# Patient Record
Sex: Female | Born: 1960 | Race: White | Hispanic: No | Marital: Married | Smoking: Never smoker
Health system: Southern US, Community
[De-identification: ages and names within clinical notes are randomized; demographics above are authoritative.]

## PROBLEM LIST (undated history)

## (undated) DIAGNOSIS — Z9221 Personal history of antineoplastic chemotherapy: Secondary | ICD-10-CM

## (undated) DIAGNOSIS — I10 Essential (primary) hypertension: Secondary | ICD-10-CM

## (undated) DIAGNOSIS — C50919 Malignant neoplasm of unspecified site of unspecified female breast: Secondary | ICD-10-CM

## (undated) DIAGNOSIS — G629 Polyneuropathy, unspecified: Secondary | ICD-10-CM

## (undated) DIAGNOSIS — M1990 Unspecified osteoarthritis, unspecified site: Secondary | ICD-10-CM

## (undated) DIAGNOSIS — Z923 Personal history of irradiation: Secondary | ICD-10-CM

## (undated) DIAGNOSIS — J302 Other seasonal allergic rhinitis: Secondary | ICD-10-CM

## (undated) DIAGNOSIS — IMO0001 Reserved for inherently not codable concepts without codable children: Secondary | ICD-10-CM

## (undated) DIAGNOSIS — J329 Chronic sinusitis, unspecified: Secondary | ICD-10-CM

## (undated) DIAGNOSIS — IMO0002 Reserved for concepts with insufficient information to code with codable children: Secondary | ICD-10-CM

## (undated) HISTORY — DX: Malignant neoplasm of unspecified site of unspecified female breast: C50.919

---

## 2005-03-08 ENCOUNTER — Ambulatory Visit: Payer: Self-pay

## 2006-04-16 ENCOUNTER — Ambulatory Visit: Payer: Self-pay

## 2006-08-31 ENCOUNTER — Ambulatory Visit: Payer: Self-pay | Admitting: Physician Assistant

## 2006-09-24 ENCOUNTER — Ambulatory Visit: Payer: Self-pay | Admitting: Pain Medicine

## 2006-10-08 ENCOUNTER — Ambulatory Visit: Payer: Self-pay | Admitting: Pain Medicine

## 2006-10-22 ENCOUNTER — Ambulatory Visit: Payer: Self-pay | Admitting: Pain Medicine

## 2007-04-21 ENCOUNTER — Ambulatory Visit: Payer: Self-pay

## 2008-04-21 ENCOUNTER — Ambulatory Visit: Payer: Self-pay

## 2008-07-16 ENCOUNTER — Emergency Department: Payer: Self-pay | Admitting: Emergency Medicine

## 2008-07-16 ENCOUNTER — Other Ambulatory Visit: Payer: Self-pay

## 2009-05-12 ENCOUNTER — Ambulatory Visit: Payer: Self-pay

## 2010-10-11 ENCOUNTER — Ambulatory Visit: Payer: Self-pay

## 2011-11-06 ENCOUNTER — Ambulatory Visit: Payer: Self-pay

## 2014-12-03 DIAGNOSIS — Z9221 Personal history of antineoplastic chemotherapy: Secondary | ICD-10-CM | POA: Insufficient documentation

## 2014-12-03 DIAGNOSIS — Z923 Personal history of irradiation: Secondary | ICD-10-CM

## 2014-12-03 DIAGNOSIS — C50919 Malignant neoplasm of unspecified site of unspecified female breast: Secondary | ICD-10-CM

## 2014-12-03 HISTORY — DX: Personal history of antineoplastic chemotherapy: Z92.21

## 2014-12-03 HISTORY — DX: Malignant neoplasm of unspecified site of unspecified female breast: C50.919

## 2014-12-03 HISTORY — DX: Personal history of irradiation: Z92.3

## 2015-04-19 ENCOUNTER — Other Ambulatory Visit: Payer: Self-pay | Admitting: Obstetrics and Gynecology

## 2015-04-19 DIAGNOSIS — N63 Unspecified lump in unspecified breast: Secondary | ICD-10-CM

## 2015-04-22 ENCOUNTER — Ambulatory Visit: Payer: No Typology Code available for payment source

## 2015-04-22 ENCOUNTER — Ambulatory Visit
Admission: RE | Admit: 2015-04-22 | Discharge: 2015-04-22 | Disposition: A | Discharge status: Home / Self Care | Payer: No Typology Code available for payment source | Source: Ambulatory Visit | Attending: Obstetrics and Gynecology | Admitting: Obstetrics and Gynecology

## 2015-04-22 DIAGNOSIS — N63 Unspecified lump in unspecified breast: Secondary | ICD-10-CM

## 2015-04-26 ENCOUNTER — Other Ambulatory Visit: Payer: Self-pay | Admitting: Obstetrics and Gynecology

## 2015-04-26 DIAGNOSIS — R928 Other abnormal and inconclusive findings on diagnostic imaging of breast: Secondary | ICD-10-CM

## 2015-04-26 DIAGNOSIS — N63 Unspecified lump in unspecified breast: Secondary | ICD-10-CM

## 2015-04-27 ENCOUNTER — Ambulatory Visit
Admission: RE | Admit: 2015-04-27 | Discharge: 2015-04-27 | Disposition: A | Discharge status: Home / Self Care | Payer: No Typology Code available for payment source | Source: Ambulatory Visit | Attending: Obstetrics and Gynecology | Admitting: Obstetrics and Gynecology

## 2015-04-27 ENCOUNTER — Other Ambulatory Visit: Payer: Self-pay | Admitting: Obstetrics and Gynecology

## 2015-04-27 DIAGNOSIS — N63 Unspecified lump in unspecified breast: Secondary | ICD-10-CM

## 2015-04-27 DIAGNOSIS — R928 Other abnormal and inconclusive findings on diagnostic imaging of breast: Secondary | ICD-10-CM

## 2015-04-27 DIAGNOSIS — C50012 Malignant neoplasm of nipple and areola, left female breast: Secondary | ICD-10-CM | POA: Insufficient documentation

## 2015-04-27 HISTORY — PX: BREAST BIOPSY: SHX20

## 2015-04-29 LAB — SURGICAL PATHOLOGY

## 2015-05-03 ENCOUNTER — Other Ambulatory Visit: Payer: Self-pay | Admitting: Oncology

## 2015-05-04 ENCOUNTER — Ambulatory Visit: Payer: PRIVATE HEALTH INSURANCE | Admitting: Surgery

## 2015-05-05 ENCOUNTER — Ambulatory Visit (INDEPENDENT_AMBULATORY_CARE_PROVIDER_SITE_OTHER): Payer: PRIVATE HEALTH INSURANCE | Admitting: Surgery

## 2015-05-05 ENCOUNTER — Encounter: Payer: Self-pay | Admitting: Surgery

## 2015-05-05 ENCOUNTER — Ambulatory Visit: Payer: PRIVATE HEALTH INSURANCE | Admitting: Surgery

## 2015-05-05 VITALS — BP 146/86 | HR 83 | Temp 98.3°F | Resp 20

## 2015-05-05 DIAGNOSIS — C50912 Malignant neoplasm of unspecified site of left female breast: Secondary | ICD-10-CM | POA: Diagnosis not present

## 2015-05-05 NOTE — Progress Notes (Signed)
Surgical Consultation  05/05/2015  Brittany Ryan is an 54 y.o. female.   CC: Left breast mass  HPI: This a patient who does regular self exams back in April noted a left breast mass which was persistent and did not disappear. In fact it has been increasing in size. She has no pain no discharge.  This patient had a ultrasound-guided core biopsy performed on the left last week. 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  GYN History: G1 P1 AB 0  Family Breast Cancer History: Paternal aunt, possibly a distant maternal relative.  History reviewed. No pertinent past medical history.  Past Surgical History  Procedure Laterality Date  . Breast biopsy Left 04/27/2015    results unknown    Family History  Problem Relation Age of Onset  . Cancer Father     Social History:  reports that she has never smoked. She has never used smokeless tobacco. She reports that she does not drink alcohol or use illicit drugs.  Allergies: No Known Allergies  Medications reviewed.   Review of Systems:   Review of Systems  Constitutional: Negative.   HENT: Negative.   Eyes: Negative.   Respiratory: Negative.   Cardiovascular: Negative.   Gastrointestinal: Negative.   Genitourinary: Negative.   Musculoskeletal: Negative.   Skin: Negative.   Neurological: Negative.   Endo/Heme/Allergies: Negative.   Psychiatric/Behavioral: Negative.      Physical Exam:  BP 146/86 mmHg  Pulse 83  Temp(Src) 98.3 F (36.8 C) (Oral)  Resp 20  Physical Exam  Constitutional: She is oriented to person, place, and time and well-developed, well-nourished, and in no distress. No distress.  HENT:  Head: Normocephalic and atraumatic.  Eyes: Conjunctivae are normal. Pupils are equal, round, and reactive to light. No scleral icterus.  Neck: Normal range of motion.  Pulmonary/Chest: Effort normal and breath sounds normal. No respiratory distress. She has no wheezes.    Abdominal: Soft. Bowel sounds are  normal. She exhibits no distension. There is no tenderness.  Musculoskeletal: Normal range of motion.  Lymphadenopathy:    She has no cervical adenopathy.  Neurological: She is alert and oriented to person, place, and time.  Skin: Skin is warm and dry.  Psychiatric: Mood, affect and judgment normal.    Breast Exam: Right breast shows no masses no axillary adenopathy.  Left breast exam shows no axillary adenopathy no discharge. There is a palpable mass at the 11:00 position with a biopsy site somewhat lateral to that and mild ecchymosis.    No results found for this or any previous visit (from the past 48 hour(s)). No results found.  Pathology: Pathology is personally reviewed demonstrating adenocarcinoma breast origin.  Assessment/Plan:  This is a patient with a left breast adenocarcinoma may be as large as 1.6 cm but is likely smaller.  I discussed with she and her husband the rationale for offering surgery in the form of a total mastectomy with sentinel node biopsy, or breast conservation therapy, partial mastectomy with needle localization left axillary sentinel node biopsy and radiotherapy. I discussed the pathophysiology of breast cancer and I discussed med onc and radiation oncology consultation. Multiple questions were answered for them.  I will see them in the office after medical oncology consultation, and schedule surgery for them that same week. They understood and agreed to proceed with this plan.  Florene Glen, MD, FACS

## 2015-05-05 NOTE — Patient Instructions (Addendum)
See medical oncology and radiation oncology next week.  See Dr. Burt Knack in the office on June 13.  We will schedule surgery for later in the week of June 13.

## 2015-05-06 ENCOUNTER — Telehealth: Payer: Self-pay | Admitting: Surgery

## 2015-05-06 NOTE — Telephone Encounter (Signed)
Sheena from the [facility] called regarding a referral needed for medical oncology. She will need an appt before she sees Dr. Burt Knack on the 13th. Vita Barley was hoping to get her in on Tuesday. [phone]

## 2015-05-09 NOTE — Telephone Encounter (Signed)
Dr Burt Knack will have to add referral within epic so that it does to the cancer center's referral work queue.

## 2015-05-10 ENCOUNTER — Other Ambulatory Visit: Payer: Self-pay | Admitting: Surgery

## 2015-05-10 ENCOUNTER — Inpatient Hospital Stay: Payer: No Typology Code available for payment source | Attending: Oncology | Admitting: Oncology

## 2015-05-10 ENCOUNTER — Encounter: Payer: Self-pay | Admitting: Oncology

## 2015-05-10 ENCOUNTER — Ambulatory Visit: Payer: PRIVATE HEALTH INSURANCE | Admitting: Surgery

## 2015-05-10 VITALS — BP 131/78 | HR 66 | Temp 97.4°F | Resp 20 | Ht 69.0 in | Wt 215.6 lb

## 2015-05-10 DIAGNOSIS — C50212 Malignant neoplasm of upper-inner quadrant of left female breast: Secondary | ICD-10-CM | POA: Diagnosis not present

## 2015-05-10 DIAGNOSIS — Z9012 Acquired absence of left breast and nipple: Secondary | ICD-10-CM | POA: Diagnosis not present

## 2015-05-10 DIAGNOSIS — C50912 Malignant neoplasm of unspecified site of left female breast: Secondary | ICD-10-CM

## 2015-05-10 DIAGNOSIS — Z171 Estrogen receptor negative status [ER-]: Secondary | ICD-10-CM | POA: Insufficient documentation

## 2015-05-10 DIAGNOSIS — F419 Anxiety disorder, unspecified: Secondary | ICD-10-CM | POA: Insufficient documentation

## 2015-05-10 DIAGNOSIS — I10 Essential (primary) hypertension: Secondary | ICD-10-CM | POA: Insufficient documentation

## 2015-05-10 NOTE — Progress Notes (Signed)
Met patient at initial Medical Oncology visit.  Given Breast Cancer Treatment Handbook/folder with hospital services.  Dr. Grayland Ormond reviewed treatment paln with patient.  Supported patient when she became emotional during chemotherapy discussion related to triple negative diagnosis.  She is scheduled for lumpectomy, and port placement on 05/19/15 with Dr. Burt Knack at [place], and will follow-up with Dr. Grayland Ormond the last week in June.  At that appointment , her MUGA scan, and chemotherapy class will be scheduled.  Accompanied to resource center and introduced to [facility] volunteer to discuss wigs, and offered to assist with wig up to $150.00 through [employer] if unable to find one in [employer].  BRCA testing sent.

## 2015-05-11 ENCOUNTER — Other Ambulatory Visit: Payer: Self-pay | Admitting: Surgery

## 2015-05-11 DIAGNOSIS — C50912 Malignant neoplasm of unspecified site of left female breast: Secondary | ICD-10-CM

## 2015-05-12 ENCOUNTER — Encounter
Admission: RE | Admit: 2015-05-12 | Discharge: 2015-05-12 | Disposition: A | Discharge status: Home / Self Care | Payer: No Typology Code available for payment source | Source: Ambulatory Visit | Attending: Surgery | Admitting: Surgery

## 2015-05-12 DIAGNOSIS — I10 Essential (primary) hypertension: Secondary | ICD-10-CM | POA: Insufficient documentation

## 2015-05-12 DIAGNOSIS — J328 Other chronic sinusitis: Secondary | ICD-10-CM | POA: Diagnosis not present

## 2015-05-12 DIAGNOSIS — C50912 Malignant neoplasm of unspecified site of left female breast: Secondary | ICD-10-CM | POA: Insufficient documentation

## 2015-05-12 DIAGNOSIS — Z9109 Other allergy status, other than to drugs and biological substances: Secondary | ICD-10-CM | POA: Insufficient documentation

## 2015-05-12 DIAGNOSIS — Z8249 Family history of ischemic heart disease and other diseases of the circulatory system: Secondary | ICD-10-CM | POA: Diagnosis not present

## 2015-05-12 DIAGNOSIS — Z01812 Encounter for preprocedural laboratory examination: Secondary | ICD-10-CM | POA: Diagnosis present

## 2015-05-12 DIAGNOSIS — Z809 Family history of malignant neoplasm, unspecified: Secondary | ICD-10-CM | POA: Insufficient documentation

## 2015-05-12 DIAGNOSIS — Z01810 Encounter for preprocedural cardiovascular examination: Secondary | ICD-10-CM | POA: Insufficient documentation

## 2015-05-12 HISTORY — DX: Essential (primary) hypertension: I10

## 2015-05-12 HISTORY — DX: Chronic sinusitis, unspecified: J32.9

## 2015-05-12 HISTORY — DX: Other seasonal allergic rhinitis: J30.2

## 2015-05-12 LAB — CBC
HCT: 38.7 % (ref 35.0–47.0)
Hemoglobin: 12.7 g/dL (ref 12.0–16.0)
MCH: 31.2 pg (ref 26.0–34.0)
MCHC: 32.9 g/dL (ref 32.0–36.0)
MCV: 94.7 fL (ref 80.0–100.0)
PLATELETS: 258 10*3/uL (ref 150–440)
RBC: 4.08 MIL/uL (ref 3.80–5.20)
RDW: 13.4 % (ref 11.5–14.5)
WBC: 6.6 10*3/uL (ref 3.6–11.0)

## 2015-05-12 LAB — BASIC METABOLIC PANEL
ANION GAP: 7 (ref 5–15)
BUN: 13 mg/dL (ref 6–20)
CALCIUM: 9.1 mg/dL (ref 8.9–10.3)
CO2: 29 mmol/L (ref 22–32)
Chloride: 105 mmol/L (ref 101–111)
Creatinine, Ser: 0.84 mg/dL (ref 0.44–1.00)
Glucose, Bld: 85 mg/dL (ref 65–99)
POTASSIUM: 4.1 mmol/L (ref 3.5–5.1)
Sodium: 141 mmol/L (ref 135–145)

## 2015-05-12 LAB — COMPREHENSIVE METABOLIC PANEL
ALBUMIN: 3.9 g/dL (ref 3.5–5.0)
ALT: 15 U/L (ref 14–54)
AST: 20 U/L (ref 15–41)
Alkaline Phosphatase: 82 U/L (ref 38–126)
Anion gap: 7 (ref 5–15)
BUN: 13 mg/dL (ref 6–20)
CALCIUM: 9.1 mg/dL (ref 8.9–10.3)
CO2: 29 mmol/L (ref 22–32)
CREATININE: 0.82 mg/dL (ref 0.44–1.00)
Chloride: 104 mmol/L (ref 101–111)
GFR calc Af Amer: >60 mL/min (ref >60)
Glucose, Bld: 83 mg/dL (ref 65–99)
Potassium: 4.1 mmol/L (ref 3.5–5.1)
Sodium: 140 mmol/L (ref 135–145)
Total Bilirubin: 0.5 mg/dL (ref 0.3–1.2)
Total Protein: 6.8 g/dL (ref 6.5–8.1)

## 2015-05-12 NOTE — Patient Instructions (Signed)
  Your procedure is scheduled on: 05/19/15 Thurs Report to Mammography.  Remember: Instructions that are not followed completely may result in serious medical risk, up to and including death, or upon the discretion of your surgeon and anesthesiologist your surgery may need to be rescheduled.    _x___ 1. Do not eat food or drink liquids after midnight. No gum chewing or hard candies.     ____ 2. No Alcohol for 24 hours before or after surgery.   ____ 3. Bring all medications with you on the day of surgery if instructed.    _x___ 4. Notify your doctor if there is any change in your medical condition     (cold, fever, infections).     Do not wear jewelry, make-up, hairpins, clips or nail polish.  Do not wear lotions, powders, or perfumes. You may wear deodorant.  Do not shave 48 hours prior to surgery. Men may shave face and neck.  Do not bring valuables to the hospital.    [facility] is not responsible for any belongings or valuables.               Contacts, dentures or bridgework may not be worn into surgery.  Leave your suitcase in the car. After surgery it may be brought to your room.  For patients admitted to the hospital, discharge time is determined by your                treatment team.   Patients discharged the day of surgery will not be allowed to drive home.    ____ Take these medicines the morning of surgery with A SIP OF WATER:    1.  2.   3.   4.  5.  6.  ____ Fleet Enema (as directed)   __x__ Use CHG Soap as directed  ____ Use inhalers on the day of surgery  ____ Stop metformin 2 days prior to surgery    ____ Take 1/2 of usual insulin dose the night before surgery and none on the morning of surgery.   ____ Stop Coumadin/Plavix/aspirin on   ____ Stop Anti-inflammatories on    ____ Stop supplements until after surgery.    ____ Bring C-Pap to the hospital.

## 2015-05-16 ENCOUNTER — Encounter
Admission: RE | Admit: 2015-05-16 | Discharge: 2015-05-16 | Disposition: A | Discharge status: Home / Self Care | Payer: No Typology Code available for payment source | Source: Ambulatory Visit | Attending: Surgery | Admitting: Surgery

## 2015-05-16 ENCOUNTER — Ambulatory Visit
Admission: RE | Admit: 2015-05-16 | Discharge: 2015-05-16 | Disposition: A | Discharge status: Home / Self Care | Payer: No Typology Code available for payment source | Source: Ambulatory Visit | Attending: Surgery | Admitting: Surgery

## 2015-05-16 ENCOUNTER — Ambulatory Visit (INDEPENDENT_AMBULATORY_CARE_PROVIDER_SITE_OTHER): Payer: PRIVATE HEALTH INSURANCE | Admitting: Surgery

## 2015-05-16 ENCOUNTER — Encounter: Payer: Self-pay | Admitting: Surgery

## 2015-05-16 VITALS — BP 187/93 | HR 94 | Temp 98.3°F | Ht 69.0 in | Wt 219.0 lb

## 2015-05-16 DIAGNOSIS — Z01818 Encounter for other preprocedural examination: Secondary | ICD-10-CM | POA: Insufficient documentation

## 2015-05-16 DIAGNOSIS — C50919 Malignant neoplasm of unspecified site of unspecified female breast: Secondary | ICD-10-CM | POA: Insufficient documentation

## 2015-05-16 DIAGNOSIS — C50212 Malignant neoplasm of upper-inner quadrant of left female breast: Secondary | ICD-10-CM

## 2015-05-16 NOTE — Progress Notes (Signed)
Surgical Consultation  05/16/2015  Brittany Ryan is an 54 y.o. female.   KG:URKY breast cancer  HPI: This patient with biopsy-proven left breast cancer. She is here for follow-up and discussion concerning surgical options. She has seen Dr. Grayland Ormond who has recommended a port placement as well. She has chosen breast conservation therapy.  GYN History:   Family Breast Cancer History:    Past Medical History  Diagnosis Date  . Breast cancer   . Seasonal allergies   . Frequent sinus infections   . Hypertension     Past Surgical History  Procedure Laterality Date  . Breast biopsy Left 04/27/2015    results unknown    Family History  Problem Relation Age of Onset  . Cancer Father   . Hypertension Father   . CAD Father   . Heart attack Father     Social History:  reports that she has never smoked. She has never used smokeless tobacco. She reports that she drinks alcohol. She reports that she does not use illicit drugs.  Allergies: No Known Allergies  Medications reviewed.   Review of Systems:   Review of Systems  Constitutional: Negative.   HENT: Negative.   Eyes: Negative.   Respiratory: Negative.   Cardiovascular: Negative.   Gastrointestinal: Negative.   Musculoskeletal: Negative.   Skin: Negative.      Physical Exam:  BP 187/93 mmHg  Pulse 94  Temp(Src) 98.3 F (36.8 C) (Oral)  Ht 5\' 9"  (1.753 m)  Wt 99.338 kg (219 lb)  BMI 32.33 kg/m2  Physical Exam  Constitutional: She is well-developed, well-nourished, and in no distress.  HENT:  Head: Normocephalic and atraumatic.  Pulmonary/Chest:      Breast Exam: Small palpable breast lump in the 11:00 position ecchymosis has resolved lump seems smaller due to resolution of ecchymosis.    No results found for this or any previous visit (from the past 48 hour(s)). X-ray Chest Pa Or Ap  05/16/2015   CLINICAL DATA:  Preoperative evaluation for breast carcinoma  EXAM: CHEST  1 VIEW  COMPARISON:   None.  FINDINGS: There is minimal scarring in the left base. There is no edema or consolidation. Heart size and pulmonary vascularity are normal. No adenopathy. No bone lesions.  IMPRESSION: No edema or consolidation.  No demonstrable mass or adenopathy.   Electronically Signed   By: Lowella Grip III M.D.   On: 05/16/2015 14:42    Pathology: Reviewed  Assessment/Plan:  Left breast cancer. Patient has chosen breast conservation therapy. She has seen medical oncology who has suggested port placement as well.  I discussed with the patient the rationale for offering mastectomy versus partial mastectomy sentinel node biopsy and radiotherapy. We discussed breast conservation therapy as her choice of primary treatment. She is told me that Dr. Grayland Ormond has suggested port placement for likely chemotherapy.  I described for her the procedure of left needle localized partial mastectomy and left sentinel node biopsy and the risks of bleeding infection recurrence cosmetic deformity seroma hematoma open wound is medically deformity and the absolute need for radiotherapy and the potential for an axillary node biopsy versus dissection.  I discussed port placement and the options of observation versus port placement versus peripheral IV. She is confused about this but is likely going to agree with port placement. I described the procedure and I described the potential risks including bleeding infection thrombosis nonfunction breakage the need for replacement or revision and the risk of pneumothorax or hemopneumothorax requiring chest  tube placement or emergency thoracotomy. She was very tearful at this and wished to discuss this again with Dr. Grayland Ormond. I suggested I would place this on the schedule and she could decide on the day of surgery whether or not she wanted to proceed with port placement as well. Questions were answered for her she understood and agreed with this plan.  Florene Glen, MD,  FACS

## 2015-05-18 NOTE — Telephone Encounter (Signed)
Pt advised of pre op date/time and sx date. Sx: 05/19/15 with Dr Burt Knack for partial mastectomy, Left SN bx with NL. Pre op: 05/12/15 office @ 7:30am.

## 2015-05-19 ENCOUNTER — Encounter
Admission: RE | Admit: 2015-05-19 | Discharge: 2015-05-19 | Disposition: A | Discharge status: Home / Self Care | Payer: No Typology Code available for payment source | Source: Ambulatory Visit | Attending: Surgery | Admitting: Surgery

## 2015-05-19 ENCOUNTER — Encounter: Payer: Self-pay | Admitting: *Deleted

## 2015-05-19 ENCOUNTER — Ambulatory Visit: Payer: No Typology Code available for payment source

## 2015-05-19 ENCOUNTER — Encounter
Admission: RE | Disposition: A | Discharge status: Home / Self Care | Payer: Self-pay | Source: Ambulatory Visit | Attending: Surgery

## 2015-05-19 ENCOUNTER — Ambulatory Visit: Payer: No Typology Code available for payment source | Admitting: Anesthesiology

## 2015-05-19 ENCOUNTER — Ambulatory Visit
Admission: RE | Admit: 2015-05-19 | Discharge: 2015-05-19 | Disposition: A | Discharge status: Home / Self Care | Payer: No Typology Code available for payment source | Source: Ambulatory Visit | Attending: Surgery | Admitting: Surgery

## 2015-05-19 ENCOUNTER — Other Ambulatory Visit: Payer: Self-pay | Admitting: Surgery

## 2015-05-19 DIAGNOSIS — Z803 Family history of malignant neoplasm of breast: Secondary | ICD-10-CM | POA: Diagnosis not present

## 2015-05-19 DIAGNOSIS — I10 Essential (primary) hypertension: Secondary | ICD-10-CM | POA: Diagnosis not present

## 2015-05-19 DIAGNOSIS — Z8249 Family history of ischemic heart disease and other diseases of the circulatory system: Secondary | ICD-10-CM | POA: Diagnosis not present

## 2015-05-19 DIAGNOSIS — C50412 Malignant neoplasm of upper-outer quadrant of left female breast: Secondary | ICD-10-CM | POA: Insufficient documentation

## 2015-05-19 DIAGNOSIS — C50912 Malignant neoplasm of unspecified site of left female breast: Secondary | ICD-10-CM | POA: Insufficient documentation

## 2015-05-19 DIAGNOSIS — Z171 Estrogen receptor negative status [ER-]: Secondary | ICD-10-CM | POA: Diagnosis not present

## 2015-05-19 DIAGNOSIS — Z419 Encounter for procedure for purposes other than remedying health state, unspecified: Secondary | ICD-10-CM

## 2015-05-19 DIAGNOSIS — Z95828 Presence of other vascular implants and grafts: Secondary | ICD-10-CM

## 2015-05-19 HISTORY — PX: MASTECTOMY, PARTIAL: SHX709

## 2015-05-19 HISTORY — PX: BREAST LUMPECTOMY WITH SENTINEL LYMPH NODE BIOPSY: SHX5597

## 2015-05-19 HISTORY — PX: BREAST LUMPECTOMY WITH NEEDLE LOCALIZATION: SHX5759

## 2015-05-19 HISTORY — PX: PORTACATH PLACEMENT: SHX2246

## 2015-05-19 SURGERY — MASTECTOMY PARTIAL
Anesthesia: General | Laterality: Left | Wound class: Clean

## 2015-05-19 MED ORDER — FENTANYL CITRATE (PF) 100 MCG/2ML IJ SOLN
INTRAMUSCULAR | Status: DC | PRN
  Administered 2015-05-19: 100 ug via INTRAVENOUS

## 2015-05-19 MED ORDER — TECHNETIUM TC 99M SULFUR COLLOID: 1.0700 | Freq: Once | INTRAVENOUS | Status: AC | PRN

## 2015-05-19 MED ORDER — HEPARIN SODIUM (PORCINE) 1000 UNIT/ML IJ SOLN
INTRAMUSCULAR | Status: DC | PRN
  Administered 2015-05-19: 51 mL via INTRAMUSCULAR

## 2015-05-19 MED ORDER — FAMOTIDINE 20 MG PO TABS
ORAL_TABLET | ORAL | Status: AC
  Administered 2015-05-19: 20 mg via ORAL
  Filled 2015-05-19: qty 1

## 2015-05-19 MED ORDER — CEFAZOLIN SODIUM-DEXTROSE 2-3 GM-% IV SOLR
2.0000 g | Freq: Once | INTRAVENOUS | Status: AC
  Administered 2015-05-19: 2 g via INTRAVENOUS

## 2015-05-19 MED ORDER — BUPIVACAINE-EPINEPHRINE (PF) 0.25% -1:200000 IJ SOLN
INTRAMUSCULAR | Status: AC
  Filled 2015-05-19: qty 30

## 2015-05-19 MED ORDER — HEPARIN SODIUM (PORCINE) 5000 UNIT/ML IJ SOLN
5000.0000 [IU] | Freq: Three times a day (TID) | INTRAMUSCULAR | Status: DC
  Administered 2015-05-19: 5000 [IU] via SUBCUTANEOUS

## 2015-05-19 MED ORDER — LACTATED RINGERS IV SOLN
INTRAVENOUS | Status: DC
  Administered 2015-05-19 (×2): via INTRAVENOUS

## 2015-05-19 MED ORDER — ISOSULFAN BLUE 1 % ~~LOC~~ SOLN
SUBCUTANEOUS | Status: AC
  Filled 2015-05-19: qty 5

## 2015-05-19 MED ORDER — LIDOCAINE HCL (CARDIAC) 20 MG/ML IV SOLN
INTRAVENOUS | Status: DC | PRN
  Administered 2015-05-19: 100 mg via INTRAVENOUS

## 2015-05-19 MED ORDER — HYDROMORPHONE HCL 1 MG/ML IJ SOLN: 0.2500 mg | INTRAMUSCULAR | Status: DC | PRN

## 2015-05-19 MED ORDER — HYDROCODONE-ACETAMINOPHEN 5-300 MG PO TABS: 1.0000 | ORAL_TABLET | ORAL | Status: DC | PRN

## 2015-05-19 MED ORDER — HYDROCODONE-ACETAMINOPHEN 5-325 MG PO TABS
ORAL_TABLET | ORAL | Status: AC
  Administered 2015-05-19: 1 via ORAL
  Filled 2015-05-19: qty 1

## 2015-05-19 MED ORDER — FAMOTIDINE 20 MG PO TABS
20.0000 mg | ORAL_TABLET | Freq: Once | ORAL | Status: AC
  Administered 2015-05-19: 20 mg via ORAL

## 2015-05-19 MED ORDER — SODIUM CHLORIDE 0.9 % IJ SOLN
INTRAMUSCULAR | Status: AC
  Filled 2015-05-19: qty 50

## 2015-05-19 MED ORDER — PHENYLEPHRINE HCL 10 MG/ML IJ SOLN
INTRAMUSCULAR | Status: DC | PRN
  Administered 2015-05-19 (×2): 100 ug via INTRAVENOUS
  Administered 2015-05-19: 50 ug via INTRAVENOUS

## 2015-05-19 MED ORDER — FENTANYL CITRATE (PF) 100 MCG/2ML IJ SOLN
25.0000 ug | INTRAMUSCULAR | Status: DC | PRN
  Administered 2015-05-19 (×4): 25 ug via INTRAVENOUS

## 2015-05-19 MED ORDER — MIDAZOLAM HCL 2 MG/2ML IJ SOLN
INTRAMUSCULAR | Status: DC | PRN
  Administered 2015-05-19: 3 mg via INTRAVENOUS
  Administered 2015-05-19: 2 mg via INTRAVENOUS

## 2015-05-19 MED ORDER — HEPARIN SODIUM (PORCINE) 5000 UNIT/ML IJ SOLN
INTRAMUSCULAR | Status: AC
  Filled 2015-05-19: qty 1

## 2015-05-19 MED ORDER — BUPIVACAINE-EPINEPHRINE (PF) 0.25% -1:200000 IJ SOLN
INTRAMUSCULAR | Status: DC | PRN
  Administered 2015-05-19: 30 mL

## 2015-05-19 MED ORDER — BUPIVACAINE HCL (PF) 0.25 % IJ SOLN
INTRAMUSCULAR | Status: DC | PRN
  Administered 2015-05-19: 10 mL

## 2015-05-19 MED ORDER — ONDANSETRON HCL 4 MG/2ML IJ SOLN
INTRAMUSCULAR | Status: AC
  Filled 2015-05-19: qty 2

## 2015-05-19 MED ORDER — PROPOFOL 10 MG/ML IV BOLUS
INTRAVENOUS | Status: DC | PRN
  Administered 2015-05-19: 150 mg via INTRAVENOUS

## 2015-05-19 MED ORDER — CEFAZOLIN SODIUM-DEXTROSE 2-3 GM-% IV SOLR
INTRAVENOUS | Status: AC
  Administered 2015-05-19: 2 g via INTRAVENOUS
  Filled 2015-05-19: qty 50

## 2015-05-19 MED ORDER — BUPIVACAINE HCL (PF) 0.25 % IJ SOLN
INTRAMUSCULAR | Status: AC
  Filled 2015-05-19: qty 30

## 2015-05-19 MED ORDER — ONDANSETRON HCL 4 MG/2ML IJ SOLN
4.0000 mg | Freq: Once | INTRAMUSCULAR | Status: AC | PRN
  Administered 2015-05-19: 4 mg via INTRAVENOUS

## 2015-05-19 MED ORDER — FENTANYL CITRATE (PF) 100 MCG/2ML IJ SOLN
INTRAMUSCULAR | Status: AC
  Filled 2015-05-19: qty 2

## 2015-05-19 MED ORDER — HEPARIN SODIUM (PORCINE) 5000 UNIT/ML IJ SOLN
INTRAMUSCULAR | Status: AC
  Administered 2015-05-19: 5000 [IU] via SUBCUTANEOUS
  Filled 2015-05-19: qty 1

## 2015-05-19 SURGICAL SUPPLY — 49 items
ADHESIVE MASTISOL STRL (MISCELLANEOUS) ×2 IMPLANT
APPLIER CLIP 9.375 SM OPEN (CLIP) ×2
BLADE SURG 15 STRL LF DISP TIS (BLADE) ×1 IMPLANT
BLADE SURG 15 STRL SS (BLADE) ×1
CANISTER SUCT 1200ML W/VALVE (MISCELLANEOUS) ×2 IMPLANT
CHLORAPREP W/TINT 26ML (MISCELLANEOUS) IMPLANT
CLIP APPLIE 9.375 SM OPEN (CLIP) ×1 IMPLANT
CNTNR SPEC 2.5X3XGRAD LEK (MISCELLANEOUS) ×4
CONT SPEC 4OZ STER OR WHT (MISCELLANEOUS) ×4
CONTAINER SPEC 2.5X3XGRAD LEK (MISCELLANEOUS) ×4 IMPLANT
COVER LIGHT HANDLE STERIS (MISCELLANEOUS) ×4 IMPLANT
DECANTER SPIKE VIAL GLASS SM (MISCELLANEOUS) ×2 IMPLANT
DEVICE DUBIN SPECIMEN MAMMOGRA (MISCELLANEOUS) ×2 IMPLANT
DRAPE C-ARM XRAY 36X54 (DRAPES) ×2 IMPLANT
DRAPE LAPAROTOMY TRNSV 106X77 (MISCELLANEOUS) ×2 IMPLANT
DRAPE PED LAPAROTOMY (DRAPES) ×2 IMPLANT
GAUZE FLUFF 18X24 1PLY STRL (GAUZE/BANDAGES/DRESSINGS) ×2 IMPLANT
GAUZE SPONGE 4X4 12PLY STRL (GAUZE/BANDAGES/DRESSINGS) ×2 IMPLANT
GLOVE BIO SURGEON STRL SZ8 (GLOVE) ×4 IMPLANT
GOWN STRL REUS W/ TWL LRG LVL3 (GOWN DISPOSABLE) ×2 IMPLANT
GOWN STRL REUS W/TWL LRG LVL3 (GOWN DISPOSABLE) ×2
KIT RM TURNOVER STRD PROC AR (KITS) ×2 IMPLANT
LABEL OR SOLS (LABEL) ×2 IMPLANT
NDL SAFETY 18GX1.5 (NEEDLE) ×2 IMPLANT
NDL SAFETY 22GX1.5 (NEEDLE) ×2 IMPLANT
NEEDLE FILTER BLUNT 18X 1/2SAF (NEEDLE) ×1
NEEDLE FILTER BLUNT 18X1 1/2 (NEEDLE) ×1 IMPLANT
NEEDLE HYPO 27GX1-1/4 (NEEDLE) ×2 IMPLANT
PACK BASIN MINOR ARMC (MISCELLANEOUS) ×2 IMPLANT
PACK PORT-A-CATH (MISCELLANEOUS) IMPLANT
PAD GROUND ADULT SPLIT (MISCELLANEOUS) ×2 IMPLANT
PORTACATH POWER 8F (Port) ×2 IMPLANT
SLEVE PROBE SENORX GAMMA FIND (MISCELLANEOUS) ×2 IMPLANT
SPONGE LAP 18X18 5 PK (GAUZE/BANDAGES/DRESSINGS) ×4 IMPLANT
STRIP CLOSURE SKIN 1/2X4 (GAUZE/BANDAGES/DRESSINGS) ×2 IMPLANT
SUT ETH BLK MONO 3 0 FS 1 12/B (SUTURE) ×2 IMPLANT
SUT MNCRL 4-0 (SUTURE) ×2
SUT MNCRL 4-0 27XMFL (SUTURE) ×2
SUT MNCRL AB 4-0 PS2 18 (SUTURE) ×2 IMPLANT
SUT PROLENE 3 0 SH DA (SUTURE) ×2 IMPLANT
SUT VIC AB 3-0 SH 27 (SUTURE) ×3
SUT VIC AB 3-0 SH 27X BRD (SUTURE) ×3 IMPLANT
SUTURE MNCRL 4-0 27XMF (SUTURE) ×2 IMPLANT
SYR 3ML LL SCALE MARK (SYRINGE) ×4 IMPLANT
SYR CONTROL 10ML (SYRINGE) ×2 IMPLANT
SYRINGE 10CC LL (SYRINGE) ×2 IMPLANT
TAPE MICROFOAM 4IN (TAPE) ×2 IMPLANT
TOWEL OR 17X26 4PK STRL BLUE (TOWEL DISPOSABLE) ×2 IMPLANT
WATER STERILE IRR 1000ML POUR (IV SOLUTION) ×2 IMPLANT

## 2015-05-19 NOTE — Anesthesia Postprocedure Evaluation (Signed)
  Anesthesia Post-op Note  Patient: Brittany Ryan  Procedure(s) Performed: Procedure(s): MASTECTOMY PARTIAL (Left) BREAST LUMPECTOMY WITH NEEDLE LOCALIZATION (Left) BREAST LUMPECTOMY WITH SENTINEL LYMPH NODE BX (Left) INSERTION PORT-A-CATH (Left)  Anesthesia type:General  Patient location: PACU  Post pain: Pain level controlled  Post assessment: Post-op Vital signs reviewed, Patient's Cardiovascular Status Stable, Respiratory Function Stable, Patent Airway and No signs of Nausea or vomiting  Post vital signs: Reviewed and stable  Last Vitals:  Filed Vitals:   05/19/15 1410  BP:   Pulse: 66  Temp:   Resp: 21    Level of consciousness: awake, alert  and patient cooperative  Complications: No apparent anesthesia complications

## 2015-05-19 NOTE — OR Nursing (Signed)
ice applied to operative area for comfort

## 2015-05-19 NOTE — Transfer of Care (Signed)
Immediate Anesthesia Transfer of Care Note  Patient: Brittany Ryan  Procedure(s) Performed: Procedure(s): MASTECTOMY PARTIAL (Left) BREAST LUMPECTOMY WITH NEEDLE LOCALIZATION (Left) BREAST LUMPECTOMY WITH SENTINEL LYMPH NODE BX (Left) INSERTION PORT-A-CATH (Left)  Patient Location: PACU  Anesthesia Type:General  Level of Consciousness: awake and sedated  Airway & Oxygen Therapy: Patient connected to face mask oxygen  Post-op Assessment: Report given to RN  Post vital signs: Reviewed and stable  Last Vitals:  Filed Vitals:   05/19/15 0922  BP: 150/92  Pulse: 67  Temp: 36.6 C  Resp: 16    Complications: No apparent anesthesia complications

## 2015-05-19 NOTE — Progress Notes (Signed)
Preoperative Review   Patient is met in the preoperative holding area. The history is reviewed in the chart and with the patient. I personally reviewed the options and rationale as well as the risks of this procedure that have been previously discussed with the patient. All questions asked by the patient and/or family were answered to their satisfaction.  The procedure of partial mastectomy with needle localization and sentinel node biopsy was discussed as was her choice for port placement with fluoroscopy at this time. This is all reviewed in detail as were the risks in detail as discussed in the office previously.  Patient agrees to proceed with this procedure at this time.  Florene Glen M.D. FACS

## 2015-05-19 NOTE — Discharge Instructions (Addendum)
Remove dressing in 24 hours. May shower in 24 hours. Leave paper strips in place. Resume all home medications. Follow-up with Dr. Burt Knack in 10 days. Sentinel Lymph Node Biopsy Care After Refer to this sheet in the next few weeks. These instructions provide you with information on caring for yourself after your procedure. Your caregiver may also give you more specific instructions. Your treatment has been planned according to current medical practices, but problems sometimes occur. Call your caregiver if you have any problems or questions after your procedure. HOME CARE INSTRUCTIONS  Avoid vigorous exercise. Ask your caregiver when you can return to your normal activities.  You may shower 24 hours after your procedure. It is okay to get your surgical cut (incision) wet. Gently pat the incision dry after you shower.  If you are given a surgical bra, wear it for the next 48 hours. You may remove the bra to shower.  Keep all follow-up appointments as directed by your caregiver.  If you have skin adhesive strips over the incision, do not remove them. They will fall off on their own over time.  Only take over-the-counter or prescription medicines for pain, fever, or discomfort as directed by your caregiver.  You may resume your regular diet. SEEK IMMEDIATE MEDICAL CARE IF:   Your pain is not controlled with medicine.  You notice redness, swelling, or increased fluid draining from the incision.  You feel nauseous or vomit. MAKE SURE YOU:  Understand these instructions.  Will watch your condition.  Will get help right away if you are not doing well or get worse. Document Released: 07/03/2004 Document Revised: 02/11/2012 Document Reviewed: 01/08/2014 [facility] Patient Information 2015 [place], [place]. This information is not intended to replace advice given to you by your health care provider. Make sure you discuss any questions you have with your health care provider.

## 2015-05-19 NOTE — Anesthesia Procedure Notes (Signed)
Procedure Name: LMA Insertion Date/Time: 05/19/2015 11:15 AM Performed by: Nelda Marseille Pre-anesthesia Checklist: Patient identified, Emergency Drugs available, Suction available, Patient being monitored and Timeout performed Patient Re-evaluated:Patient Re-evaluated prior to inductionOxygen Delivery Method: Circle system utilized Preoxygenation: Pre-oxygenation with 100% oxygen Intubation Type: IV induction Ventilation: Mask ventilation without difficulty LMA: LMA inserted LMA Size: 3.5 Tube type: Oral Number of attempts: 1 Placement Confirmation: positive ETCO2 and breath sounds checked- equal and bilateral Tube secured with: Tape Dental Injury: Teeth and Oropharynx as per pre-operative assessment

## 2015-05-19 NOTE — Anesthesia Preprocedure Evaluation (Signed)
Anesthesia Evaluation  Patient identified by MRN, date of birth, ID band Patient awake    Reviewed: Allergy & Precautions, NPO status , Patient's Chart, lab work & pertinent test results  History of Anesthesia Complications (+) Family history of anesthesia reaction  Airway Mallampati: II  TM Distance: >3 FB Neck ROM: Full    Dental no notable dental hx.    Pulmonary neg pulmonary ROS,  breath sounds clear to auscultation  Pulmonary exam normal       Cardiovascular Exercise Tolerance: Good negative cardio ROS Normal cardiovascular examRhythm:Regular Rate:Normal     Neuro/Psych negative neurological ROS  negative psych ROS   GI/Hepatic negative GI ROS, Neg liver ROS,   Endo/Other  negative endocrine ROS  Renal/GU negative Renal ROS  negative genitourinary   Musculoskeletal negative musculoskeletal ROS (+)   Abdominal   Peds negative pediatric ROS (+)  Hematology negative hematology ROS (+)   Anesthesia Other Findings - Breast cancer - Father with pseudocholinesterase deficiency  Reproductive/Obstetrics negative OB ROS                             Anesthesia Physical Anesthesia Plan  ASA: III  Anesthesia Plan: General   Post-op Pain Management:    Induction: Intravenous  Airway Management Planned: LMA  Additional Equipment:   Intra-op Plan:   Post-operative Plan: Extubation in OR  Informed Consent: I have reviewed the patients History and Physical, chart, labs and discussed the procedure including the risks, benefits and alternatives for the proposed anesthesia with the patient or authorized representative who has indicated his/her understanding and acceptance.   Dental advisory given  Plan Discussed with: CRNA and Surgeon  Anesthesia Plan Comments:         Anesthesia Quick Evaluation

## 2015-05-19 NOTE — OR Nursing (Signed)
22 seconds c-arm time

## 2015-05-19 NOTE — Op Note (Addendum)
Pre-operative Diagnosis: Breast Cancer left    Post-operative Diagnosis: Same   Surgeon: Jerrol Banana. Burt Knack, MD FACS  Anesthesia: Gen. with LMA  Procedure: Left Partial mastectomy, needle directed, sentinel node biopsy  Procedure Details  The patient was seen again in the Holding Room. The benefits, complications, treatment options, and expected outcomes were discussed with the patient. The risks of bleeding, infection, recurrence of symptoms, failure to resolve symptoms, hematoma, seroma, open wound, cosmetic deformity, and the need for further surgery were discussed.  The patient was taken to Operating Room, identified as Brittany Ryan and the procedure verified.  A Time Out was held and the above information confirmed.  Prior to the induction of general anesthesia, antibiotic prophylaxis was administered. VTE prophylaxis was in place. Appropriate anesthesia was then administered and tolerated well. The chest was prepped with Chloraprep and draped in the sterile fashion. The patient was positioned in the supine position.   A visual dye was injected periareolar early under aseptic conditions. Then using the hand-held probe an area of high counts was identified in the axilla, an incision was made and direction by the probe aided in dissection of a lymph node which was sent for frozen section. The patient's obesity made it difficult to identify the node. However ultimately a single large lymph node was elevated, and sent off for examination. The pathologist called back stating that there was no malignancy on touch prep.  Attention was turned to the needle localization site where an incision was made encompassing the needle insertion site and the prior biopsy site. Dissection around the needle to perform a partial mastectomy with adequate margins was performed. This was done with electrocautery and sharp dissection. Hemostasis was with electrocautery. Additional Marcaine was infiltrated into the  skin and subcutaneous tissues of the cavity. Once assuring that hemostasis was adequate and checked multiple times the wound was closed with interrupted 3-0 Vicryl followed by 4-0 subcuticular Monocryl sutures.  The axillary wound was closed in a similar fashion.   Steri-Strips Mastisol and sterile dressings were placed  Patient was taken to the recovery room in stable condition where a postoperative chest film has been ordered.    Findings: Negative sentinel node  Estimated Blood Loss: Minimal         Drains: None         Specimens: partial mastectomy with labels, long lateral and short superior; sentinel node for frozen section       Complications: None                  Condition: Stable   Yair Dusza E. Burt Knack, MD, FACS     Pre-operative Diagnosis:  Post-operative Diagnosis:    Surgeon: Jerrol Banana. Burt Knack, MD FACS  Anesthesia: Gen. with LMA  Procedure: Port placement with fluoroscopy  Procedure Details  The patient was seen again in the Holding Room. The benefits, complications, treatment options, and expected outcomes were discussed with the patient. The risks of bleeding, infection, recurrence of symptoms, failure to resolve symptoms,  thrombosis nonfunction breakage pneumothorax hemopneumothorax any of which could require chest tube or further surgery were reviewed with the patient.   The patient was taken to Operating Room, identified as Brittany Ryan and the procedure verified.  A Time Out was held and the above information confirmed.  Prior to the induction of general anesthesia, antibiotic prophylaxis was administered. VTE prophylaxis was in place. Appropriate anesthesia was then administered and tolerated well. The chest was prepped with Chloraprep and draped  in the sterile fashion. The patient was positioned in the supine position. Then the patient was placed in Trendelenburg position.  The patient was under general anesthesia following the mastectomy and  sentinel node biopsy. The patient was then reprepped and redraped and placed in steep Trendelenburg position.  Patient was prepped and draped in sterile fashion and in a Trendelenburg position local anesthetic was infiltrated into the skin and subcutaneous tissues in the anterior chest wall. The large bore needle was placed into the subclavian vein without difficulty and then the Seldinger wire was advanced. Fluoroscopy was utilized to confirm that the Seldinger wire was in the superior vena cava.  An incision was made and a port pocket developed with blunt and electrocautery dissection. The introducer dilator was placed over the Seldinger wire the wire was removed. The previously flushed catheter was placed into the introducer dilator and the peel-away sheath was removed. The catheter length was confirmed and trimmed utilizing fluoroscopy for proper positioning. The catheter was then attached to the previously flushed port. The port was placed into the pocket. The port was held in with 3-0 Prolenes and flushed for function and heparin locked.  The wound was closed with interrupted 3-0 Vicryl followed by 4-0 subcuticular Monocryl sutures. Steri-Strips Mastisol and sterile dressings were placed  Patient was taken to the recovery room in stable condition where a postoperative chest film has been ordered.    Findings: Port placed in the subclavian position verified by fluoroscopy. A postoperative chest film has been ordered.  Estimated Blood Loss: Minimal         Drains: None         Specimens: None       Complications: None                  Condition: Stable   Daphne Karrer E. Burt Knack, MD, FACS

## 2015-05-19 NOTE — Progress Notes (Unsigned)
Patient ID: IVANKA KIRSHNER, female   DOB: August 18, 1961, 54 y.o.   MRN: [id number] Oncology Nurse Navigator Documentation  Oncology Nurse Navigator Flowsheets 05/10/2015 05/19/2015  Navigator Encounter Type Initial MedOnc (No Data)  Patient Visit Type Medonc Surgery  Treatment Phase - (No Data)  Barriers/Navigation Needs Education -  Education - Preparing for Higher education careers adviser of Care - Other  Support Groups/Services Breast -  Time Spent with Patient 26 30   Offered support to patient, and met with family prior to surgery to answer questions about waiting times, and follow-up.  Informed them that Dr. Burt Knack would meet with them post-op to discuss surgery, and he will provide discharge instructions regarding care,follow-up visits, and when patient can return to work.  Patient has asked several times about returning to work, and has been encouraged to discuss with Psychologist, sport and exercise.

## 2015-05-19 NOTE — Progress Notes (Signed)
Patient called in to change Pharmacy to [facility]

## 2015-05-20 ENCOUNTER — Encounter: Payer: Self-pay | Admitting: Surgery

## 2015-05-20 ENCOUNTER — Telehealth: Payer: Self-pay

## 2015-05-20 MED ORDER — ONDANSETRON 4 MG PO TBDP
4.0000 mg | ORAL_TABLET | Freq: Three times a day (TID) | ORAL | Status: DC | PRN
Start: 2015-05-20 — End: 2016-02-10

## 2015-05-20 NOTE — Telephone Encounter (Signed)
Patient called in with complaints of Nausea since getting home last evening and itching.  Explained to patient that she can take OTC benadryl for itching and this can be from anesthesia and also multiple medications she was given yesterday with surgery.  Called Dr. Burt Knack regarding nausea and he would like Zofran called to pharmacy. This has been done.  Spoke with patient and explained that medication was called to pharmacy at this time. Encouraged to call back with any further questions or concerns.

## 2015-05-23 LAB — SURGICAL PATHOLOGY

## 2015-05-24 ENCOUNTER — Telehealth: Payer: Self-pay

## 2015-05-24 ENCOUNTER — Telehealth: Payer: Self-pay | Admitting: Surgery

## 2015-05-24 NOTE — Telephone Encounter (Signed)
Spoke with patient at this time. She has had 3 doses of Miralax and still no BM. Instructed patient to take 2 doses of Miralax - 6 hours apart. Increase water and movement as much as possible. Then call me back if she still has not had a BM after both doses of Miralax.   States that bleeding is bright red and was after straining to have a BM.

## 2015-05-24 NOTE — Telephone Encounter (Signed)
Phoned patient as surgery follow-up.  She is working with Dr. Burt Knack in regards to constipation post-op.  Reminded her of follow-up with Dr. Grayland Ormond on 06/01/15.  Answred questions about hair loss with chemotherapy.  Offered to hold wig in [employer] that she has picked out, but she is undecided.  Also offered to assist with cost of wig up to $150.00 through [employer] if she decides to purchase one. Oncology Nurse Navigator Documentation  Oncology Nurse Navigator Flowsheets 05/24/2015  Navigator Encounter Type Telephone;Education;Treatment  Patient Visit Type Surgery  Treatment Phase -  Barriers/Navigation Needs No barriers at this time  Education Preparing for Upcoming Surgery/ Treatment  Coordination of Care MD Appointments  Support Groups/Services -  Specialty Items/DME Wigs  Time Spent with Patient 15

## 2015-05-24 NOTE — Telephone Encounter (Signed)
Patient had surgery Thursday - she has not had a bowel movement since the surgery. She started taking Miralax yesterday afternoon and is now having some bleeding and she thinks that is from straining. She would like to know what else she can take or if something can be called in for her to have a bowel movement. Please call and advise.

## 2015-06-01 ENCOUNTER — Inpatient Hospital Stay (HOSPITAL_BASED_OUTPATIENT_CLINIC_OR_DEPARTMENT_OTHER): Payer: No Typology Code available for payment source | Admitting: Oncology

## 2015-06-01 VITALS — BP 135/75 | HR 62 | Temp 96.4°F | Resp 18 | Wt 217.4 lb

## 2015-06-01 DIAGNOSIS — Z171 Estrogen receptor negative status [ER-]: Secondary | ICD-10-CM

## 2015-06-01 DIAGNOSIS — C50212 Malignant neoplasm of upper-inner quadrant of left female breast: Secondary | ICD-10-CM | POA: Diagnosis not present

## 2015-06-01 DIAGNOSIS — Z9012 Acquired absence of left breast and nipple: Secondary | ICD-10-CM

## 2015-06-01 DIAGNOSIS — F419 Anxiety disorder, unspecified: Secondary | ICD-10-CM | POA: Diagnosis not present

## 2015-06-01 DIAGNOSIS — C50912 Malignant neoplasm of unspecified site of left female breast: Secondary | ICD-10-CM

## 2015-06-01 DIAGNOSIS — I10 Essential (primary) hypertension: Secondary | ICD-10-CM | POA: Diagnosis not present

## 2015-06-01 MED ORDER — LIDOCAINE-PRILOCAINE 2.5-2.5 % EX CREA: 1.0000 "application " | TOPICAL_CREAM | CUTANEOUS | Status: DC | PRN

## 2015-06-02 ENCOUNTER — Encounter: Payer: Self-pay | Admitting: Surgery

## 2015-06-02 ENCOUNTER — Ambulatory Visit (INDEPENDENT_AMBULATORY_CARE_PROVIDER_SITE_OTHER): Payer: PRIVATE HEALTH INSURANCE | Admitting: Surgery

## 2015-06-02 VITALS — BP 167/88 | HR 69 | Temp 98.3°F | Ht 69.0 in | Wt 218.2 lb

## 2015-06-02 DIAGNOSIS — Z09 Encounter for follow-up examination after completed treatment for conditions other than malignant neoplasm: Secondary | ICD-10-CM

## 2015-06-02 NOTE — Patient Instructions (Signed)
Please call our office with any questions or concerns.   Dr. Maryjane Hurter will decide when port can be removed and let our office know to schedule port removal.  You may shower, wash incisions with soap and water daily.  Please place sunscreen on incisions to avoid further scarring.

## 2015-06-02 NOTE — Patient Instructions (Signed)
Doxorubicin injection What is this medicine? DOXORUBICIN (dox oh ROO bi sin) is a chemotherapy drug. It is used to treat many kinds of cancer like Hodgkin's disease, leukemia, non-Hodgkin's lymphoma, neuroblastoma, sarcoma, and Wilms' tumor. It is also used to treat bladder cancer, breast cancer, lung cancer, ovarian cancer, stomach cancer, and thyroid cancer. This medicine may be used for other purposes; ask your health care provider or pharmacist if you have questions. COMMON BRAND NAME(S): Adriamycin, Adriamycin PFS, Adriamycin RDF, Rubex What should I tell my health care provider before I take this medicine? They need to know if you have any of these conditions: -blood disorders -heart disease, recent heart attack -infection (especially a virus infection such as chickenpox, cold sores, or herpes) -irregular heartbeat -liver disease -recent or ongoing radiation therapy -an unusual or allergic reaction to doxorubicin, other chemotherapy agents, other medicines, foods, dyes, or preservatives -pregnant or trying to get pregnant -breast-feeding How should I use this medicine? This drug is given as an infusion into a vein. It is administered in a hospital or clinic by a specially trained health care professional. If you have pain, swelling, burning or any unusual feeling around the site of your injection, tell your health care professional right away. Talk to your pediatrician regarding the use of this medicine in children. Special care may be needed. Overdosage: If you think you have taken too much of this medicine contact a poison control center or emergency room at once. NOTE: This medicine is only for you. Do not share this medicine with others. What if I miss a dose? It is important not to miss your dose. Call your doctor or health care professional if you are unable to keep an appointment. What may interact with this medicine? Do not take this medicine with any of the following  medications: -cisapride -droperidol -halofantrine -pimozide -zidovudine This medicine may also interact with the following medications: -chloroquine -chlorpromazine -clarithromycin -cyclophosphamide -cyclosporine -erythromycin -medicines for depression, anxiety, or psychotic disturbances -medicines for irregular heart beat like amiodarone, bepridil, dofetilide, encainide, flecainide, propafenone, quinidine -medicines for seizures like ethotoin, fosphenytoin, phenytoin -medicines for nausea, vomiting like dolasetron, ondansetron, palonosetron -medicines to increase blood counts like filgrastim, pegfilgrastim, sargramostim -methadone -methotrexate -pentamidine -progesterone -vaccines -verapamil Talk to your doctor or health care professional before taking any of these medicines: -acetaminophen -aspirin -ibuprofen -ketoprofen -naproxen This list may not describe all possible interactions. Give your health care provider a list of all the medicines, herbs, non-prescription drugs, or dietary supplements you use. Also tell them if you smoke, drink alcohol, or use illegal drugs. Some items may interact with your medicine. What should I watch for while using this medicine? Your condition will be monitored carefully while you are receiving this medicine. You will need important blood work done while you are taking this medicine. This drug may make you feel generally unwell. This is not uncommon, as chemotherapy can affect healthy cells as well as cancer cells. Report any side effects. Continue your course of treatment even though you feel ill unless your doctor tells you to stop. Your urine may turn red for a few days after your dose. This is not blood. If your urine is dark or brown, call your doctor. In some cases, you may be given additional medicines to help with side effects. Follow all directions for their use. Call your doctor or health care professional for advice if you get a  fever, chills or sore throat, or other symptoms of a cold or flu. Do not   treat yourself. This drug decreases your body's ability to fight infections. Try to avoid being around people who are sick. This medicine may increase your risk to bruise or bleed. Call your doctor or health care professional if you notice any unusual bleeding. Be careful brushing and flossing your teeth or using a toothpick because you may get an infection or bleed more easily. If you have any dental work done, tell your dentist you are receiving this medicine. Avoid taking products that contain aspirin, acetaminophen, ibuprofen, naproxen, or ketoprofen unless instructed by your doctor. These medicines may hide a fever. Men and women of childbearing age should use effective birth control methods while using taking this medicine. Do not become pregnant while taking this medicine. There is a potential for serious side effects to an unborn child. Talk to your health care professional or pharmacist for more information. Do not breast-feed an infant while taking this medicine. Do not let others touch your urine or other body fluids for 5 days after each treatment with this medicine. Caregivers should wear latex gloves to avoid touching body fluids during this time. There is a maximum amount of this medicine you should receive throughout your life. The amount depends on the medical condition being treated and your overall health. Your doctor will watch how much of this medicine you receive in your lifetime. Tell your doctor if you have taken this medicine before. What side effects may I notice from receiving this medicine? Side effects that you should report to your doctor or health care professional as soon as possible: -allergic reactions like skin rash, itching or hives, swelling of the face, lips, or tongue -low blood counts - this medicine may decrease the number of white blood cells, red blood cells and platelets. You may be at  increased risk for infections and bleeding. -signs of infection - fever or chills, cough, sore throat, pain or difficulty passing urine -signs of decreased platelets or bleeding - bruising, pinpoint red spots on the skin, black, tarry stools, blood in the urine -signs of decreased red blood cells - unusually weak or tired, fainting spells, lightheadedness -breathing problems -chest pain -fast, irregular heartbeat -mouth sores -nausea, vomiting -pain, swelling, redness at site where injected -pain, tingling, numbness in the hands or feet -swelling of ankles, feet, or hands -unusual bleeding or bruising Side effects that usually do not require medical attention (report to your doctor or health care professional if they continue or are bothersome): -diarrhea -facial flushing -hair loss -loss of appetite -missed menstrual periods -nail discoloration or damage -red or watery eyes -red colored urine -stomach upset This list may not describe all possible side effects. Call your doctor for medical advice about side effects. You may report side effects to FDA at 1-800-FDA-1088. Where should I keep my medicine? This drug is given in a hospital or clinic and will not be stored at home. NOTE: This sheet is a summary. It may not cover all possible information. If you have questions about this medicine, talk to your doctor, pharmacist, or health care provider.  2015, Elsevier/Gold Standard. (2013-03-17 09:54:34) Cyclophosphamide injection What is this medicine? CYCLOPHOSPHAMIDE (sye kloe FOSS fa mide) is a chemotherapy drug. It slows the growth of cancer cells. This medicine is used to treat many types of cancer like lymphoma, myeloma, leukemia, breast cancer, and ovarian cancer, to name a few. This medicine may be used for other purposes; ask your health care provider or pharmacist if you have questions. COMMON BRAND NAME(S): Cytoxan,  Neosar What should I tell my health care provider before I  take this medicine? They need to know if you have any of these conditions: -blood disorders -history of other chemotherapy -infection -kidney disease -liver disease -recent or ongoing radiation therapy -tumors in the bone marrow -an unusual or allergic reaction to cyclophosphamide, other chemotherapy, other medicines, foods, dyes, or preservatives -pregnant or trying to get pregnant -breast-feeding How should I use this medicine? This drug is usually given as an injection into a vein or muscle or by infusion into a vein. It is administered in a hospital or clinic by a specially trained health care professional. Talk to your pediatrician regarding the use of this medicine in children. Special care may be needed. Overdosage: If you think you have taken too much of this medicine contact a poison control center or emergency room at once. NOTE: This medicine is only for you. Do not share this medicine with others. What if I miss a dose? It is important not to miss your dose. Call your doctor or health care professional if you are unable to keep an appointment. What may interact with this medicine? This medicine may interact with the following medications: -amiodarone -amphotericin B -azathioprine -certain antiviral medicines for HIV or AIDS such as protease inhibitors (e.g., indinavir, ritonavir) and zidovudine -certain blood pressure medications such as benazepril, captopril, enalapril, fosinopril, lisinopril, moexipril, monopril, perindopril, quinapril, ramipril, trandolapril -certain cancer medications such as anthracyclines (e.g., daunorubicin, doxorubicin), busulfan, cytarabine, paclitaxel, pentostatin, tamoxifen, trastuzumab -certain diuretics such as chlorothiazide, chlorthalidone, hydrochlorothiazide, indapamide, metolazone -certain medicines that treat or prevent blood clots like warfarin -certain muscle relaxants such as  succinylcholine -cyclosporine -etanercept -indomethacin -medicines to increase blood counts like filgrastim, pegfilgrastim, sargramostim -medicines used as general anesthesia -metronidazole -natalizumab This list may not describe all possible interactions. Give your health care provider a list of all the medicines, herbs, non-prescription drugs, or dietary supplements you use. Also tell them if you smoke, drink alcohol, or use illegal drugs. Some items may interact with your medicine. What should I watch for while using this medicine? Visit your doctor for checks on your progress. This drug may make you feel generally unwell. This is not uncommon, as chemotherapy can affect healthy cells as well as cancer cells. Report any side effects. Continue your course of treatment even though you feel ill unless your doctor tells you to stop. Drink water or other fluids as directed. Urinate often, even at night. In some cases, you may be given additional medicines to help with side effects. Follow all directions for their use. Call your doctor or health care professional for advice if you get a fever, chills or sore throat, or other symptoms of a cold or flu. Do not treat yourself. This drug decreases your body's ability to fight infections. Try to avoid being around people who are sick. This medicine may increase your risk to bruise or bleed. Call your doctor or health care professional if you notice any unusual bleeding. Be careful brushing and flossing your teeth or using a toothpick because you may get an infection or bleed more easily. If you have any dental work done, tell your dentist you are receiving this medicine. You may get drowsy or dizzy. Do not drive, use machinery, or do anything that needs mental alertness until you know how this medicine affects you. Do not become pregnant while taking this medicine or for 1 year after stopping it. Women should inform their doctor if they wish to become  pregnant or think they might be pregnant. Men should not father a child while taking this medicine and for 4 months after stopping it. There is a potential for serious side effects to an unborn child. Talk to your health care professional or pharmacist for more information. Do not breast-feed an infant while taking this medicine. This medicine may interfere with the ability to have a child. This medicine has caused ovarian failure in some women. This medicine has caused reduced sperm counts in some men. You should talk with your doctor or health care professional if you are concerned about your fertility. If you are going to have surgery, tell your doctor or health care professional that you have taken this medicine. What side effects may I notice from receiving this medicine? Side effects that you should report to your doctor or health care professional as soon as possible: -allergic reactions like skin rash, itching or hives, swelling of the face, lips, or tongue -low blood counts - this medicine may decrease the number of white blood cells, red blood cells and platelets. You may be at increased risk for infections and bleeding. -signs of infection - fever or chills, cough, sore throat, pain or difficulty passing urine -signs of decreased platelets or bleeding - bruising, pinpoint red spots on the skin, black, tarry stools, blood in the urine -signs of decreased red blood cells - unusually weak or tired, fainting spells, lightheadedness -breathing problems -dark urine -dizziness -palpitations -swelling of the ankles, feet, hands -trouble passing urine or change in the amount of urine -weight gain -yellowing of the eyes or skin Side effects that usually do not require medical attention (report to your doctor or health care professional if they continue or are bothersome): -changes in nail or skin color -hair loss -missed menstrual periods -mouth sores -nausea, vomiting This list may not  describe all possible side effects. Call your doctor for medical advice about side effects. You may report side effects to FDA at 1-800-FDA-1088. Where should I keep my medicine? This drug is given in a hospital or clinic and will not be stored at home. NOTE: This sheet is a summary. It may not cover all possible information. If you have questions about this medicine, talk to your doctor, pharmacist, or health care provider.  2015, Elsevier/Gold Standard. (2012-10-03 16:22:58) Paclitaxel injection What is this medicine? PACLITAXEL (PAK li TAX el) is a chemotherapy drug. It targets fast dividing cells, like cancer cells, and causes these cells to die. This medicine is used to treat ovarian cancer, breast cancer, and other cancers. This medicine may be used for other purposes; ask your health care provider or pharmacist if you have questions. COMMON BRAND NAME(S): Onxol, Taxol What should I tell my health care provider before I take this medicine? They need to know if you have any of these conditions: -blood disorders -irregular heartbeat -infection (especially a virus infection such as chickenpox, cold sores, or herpes) -liver disease -previous or ongoing radiation therapy -an unusual or allergic reaction to paclitaxel, alcohol, polyoxyethylated castor oil, other chemotherapy agents, other medicines, foods, dyes, or preservatives -pregnant or trying to get pregnant -breast-feeding How should I use this medicine? This drug is given as an infusion into a vein. It is administered in a hospital or clinic by a specially trained health care professional. Talk to your pediatrician regarding the use of this medicine in children. Special care may be needed. Overdosage: If you think you have taken too much of this medicine contact a poison  control center or emergency room at once. NOTE: This medicine is only for you. Do not share this medicine with others. What if I miss a dose? It is important not  to miss your dose. Call your doctor or health care professional if you are unable to keep an appointment. What may interact with this medicine? Do not take this medicine with any of the following medications: -disulfiram -metronidazole This medicine may also interact with the following medications: -cyclosporine -diazepam -ketoconazole -medicines to increase blood counts like filgrastim, pegfilgrastim, sargramostim -other chemotherapy drugs like cisplatin, doxorubicin, epirubicin, etoposide, teniposide, vincristine -quinidine -testosterone -vaccines -verapamil Talk to your doctor or health care professional before taking any of these medicines: -acetaminophen -aspirin -ibuprofen -ketoprofen -naproxen This list may not describe all possible interactions. Give your health care provider a list of all the medicines, herbs, non-prescription drugs, or dietary supplements you use. Also tell them if you smoke, drink alcohol, or use illegal drugs. Some items may interact with your medicine. What should I watch for while using this medicine? Your condition will be monitored carefully while you are receiving this medicine. You will need important blood work done while you are taking this medicine. This drug may make you feel generally unwell. This is not uncommon, as chemotherapy can affect healthy cells as well as cancer cells. Report any side effects. Continue your course of treatment even though you feel ill unless your doctor tells you to stop. In some cases, you may be given additional medicines to help with side effects. Follow all directions for their use. Call your doctor or health care professional for advice if you get a fever, chills or sore throat, or other symptoms of a cold or flu. Do not treat yourself. This drug decreases your body's ability to fight infections. Try to avoid being around people who are sick. This medicine may increase your risk to bruise or bleed. Call your doctor or  health care professional if you notice any unusual bleeding. Be careful brushing and flossing your teeth or using a toothpick because you may get an infection or bleed more easily. If you have any dental work done, tell your dentist you are receiving this medicine. Avoid taking products that contain aspirin, acetaminophen, ibuprofen, naproxen, or ketoprofen unless instructed by your doctor. These medicines may hide a fever. Do not become pregnant while taking this medicine. Women should inform their doctor if they wish to become pregnant or think they might be pregnant. There is a potential for serious side effects to an unborn child. Talk to your health care professional or pharmacist for more information. Do not breast-feed an infant while taking this medicine. Men are advised not to father a child while receiving this medicine. What side effects may I notice from receiving this medicine? Side effects that you should report to your doctor or health care professional as soon as possible: -allergic reactions like skin rash, itching or hives, swelling of the face, lips, or tongue -low blood counts - This drug may decrease the number of white blood cells, red blood cells and platelets. You may be at increased risk for infections and bleeding. -signs of infection - fever or chills, cough, sore throat, pain or difficulty passing urine -signs of decreased platelets or bleeding - bruising, pinpoint red spots on the skin, black, tarry stools, nosebleeds -signs of decreased red blood cells - unusually weak or tired, fainting spells, lightheadedness -breathing problems -chest pain -high or low blood pressure -mouth sores -nausea and vomiting -pain,  swelling, redness or irritation at the injection site -pain, tingling, numbness in the hands or feet -slow or irregular heartbeat -swelling of the ankle, feet, hands Side effects that usually do not require medical attention (report to your doctor or health  care professional if they continue or are bothersome): -bone pain -complete hair loss including hair on your head, underarms, pubic hair, eyebrows, and eyelashes -changes in the color of fingernails -diarrhea -loosening of the fingernails -loss of appetite -muscle or joint pain -red flush to skin -sweating This list may not describe all possible side effects. Call your doctor for medical advice about side effects. You may report side effects to FDA at 1-800-FDA-1088. Where should I keep my medicine? This drug is given in a hospital or clinic and will not be stored at home. NOTE: This sheet is a summary. It may not cover all possible information. If you have questions about this medicine, talk to your doctor, pharmacist, or health care provider.  2015, Elsevier/Gold Standard. (2013-01-12 16:41:21)

## 2015-06-02 NOTE — Progress Notes (Signed)
S: Doing well.  No acute issues, no incision issues Blood pressure 167/88, pulse 69, temperature 98.3 F (36.8 C), temperature source Oral, height 5\' 9"  (1.753 m), weight 218 lb 3.2 oz (98.975 kg). GEN: NAD/A&Ox3 BREAST: specimen incision, axillary incision and port placement incision c/d/i, no erythema/induration/drainage  A/P s/p left lumpectomy, SLN biopsy and port a cath placement, no acute issues - postoperative care per oncology

## 2015-06-04 NOTE — Progress Notes (Signed)
[place]  Telephone:(336) [phone] Fax:(336) [phone]  ID: Brittany Ryan OB: 04-23-1961  MR#: [id number]  JTT#:[id number]  Patient Care Team: [name], NP as PCP - General (Nurse Practitioner) [name], NP (Nurse Practitioner) Florene Glen, MD (Surgery)  CHIEF COMPLAINT:  Chief Complaint  Patient presents with  . New Evaluation    left breast cancer    INTERVAL HISTORY: Patient is a 54 year old female who was found to have an abnormality on mammogram. Subsequent biopsy confirmed an invasive triple negative breast cancer. Currently, she is highly anxious but otherwise feels well. She has no neurologic complaints. She denies any recent fevers or illnesses. She has a  good appetite and denies weight loss. She denies any pain. She has no chest pain or shortness of breath. She denies any nausea, vomiting, constipation, or diarrhea. She has no urinary complaints. Patient otherwise feels well and offers no further specific complaints.  REVIEW OF SYSTEMS:   Review of Systems  Constitutional: Negative.   Psychiatric/Behavioral: The patient is nervous/anxious.     As per HPI. Otherwise, a complete review of systems is negatve.  PAST MEDICAL HISTORY: Past Medical History  Diagnosis Date  . Breast cancer   . Seasonal allergies   . Frequent sinus infections   . Hypertension     PAST SURGICAL HISTORY: Past Surgical History  Procedure Laterality Date  . Breast biopsy Left 04/27/2015    results unknown  . Mastectomy, partial Left 05/19/2015    Procedure: MASTECTOMY PARTIAL;  Surgeon: Florene Glen, MD;  Location: [facility];  Service: General;  Laterality: Left;  . Breast lumpectomy with needle localization Left 05/19/2015    Procedure: BREAST LUMPECTOMY WITH NEEDLE LOCALIZATION;  Surgeon: Florene Glen, MD;  Location: [facility];  Service: General;  Laterality: Left;  . Breast lumpectomy with sentinel lymph node biopsy Left 05/19/2015    Procedure:  BREAST LUMPECTOMY WITH SENTINEL LYMPH NODE BX;  Surgeon: Florene Glen, MD;  Location: [facility];  Service: General;  Laterality: Left;  . Portacath placement Left 05/19/2015    Procedure: INSERTION PORT-A-CATH;  Surgeon: Florene Glen, MD;  Location: [facility];  Service: General;  Laterality: Left;    FAMILY HISTORY Family History  Problem Relation Age of Onset  . Cancer Father   . Hypertension Father   . CAD Father   . Heart attack Father        ADVANCED DIRECTIVES:    HEALTH MAINTENANCE: History  Substance Use Topics  . Smoking status: Never Smoker   . Smokeless tobacco: Never Used  . Alcohol Use: Yes     Comment: rare beer     Colonoscopy:  PAP:  Bone density:  Lipid panel:  No Known Allergies  Current Outpatient Prescriptions  Medication Sig Dispense Refill  . acetaminophen (TYLENOL) 500 MG tablet Take 1,000 mg by mouth every 6 (six) hours as needed for mild pain or headache.    . Calcium Carbonate-Vitamin D (CALCIUM-VITAMIN D) 500-200 MG-UNIT per tablet Take 1 tablet by mouth daily.    . B Complex Vitamins (VITAMIN B COMPLEX PO) Take 1 tablet by mouth daily as needed.    . diphenhydramine-acetaminophen (TYLENOL PM) 25-500 MG TABS Take 1-2 tablets by mouth at bedtime as needed.    . fluticasone (FLONASE) 50 MCG/ACT nasal spray Place into the nose.    Marland Kitchen Hydrocodone-Acetaminophen (VICODIN) 5-300 MG TABS Take 1 tablet by mouth every 4 (four) hours as needed. 30 each 0  . lidocaine-prilocaine (EMLA)  cream Apply 1 application topically as needed. Apply to port and cover with saran wrap 1-2 hours before chemotherapy 30 g 2  . loratadine (CLARITIN) 10 MG tablet Take by mouth.    . ondansetron (ZOFRAN-ODT) 4 MG disintegrating tablet Take 1 tablet (4 mg total) by mouth every 8 (eight) hours as needed for nausea or vomiting. 20 tablet 0   No current facility-administered medications for this visit.    OBJECTIVE: Filed Vitals:   05/10/15 1514  BP: 131/78  Pulse: 66    Temp: 97.4 F (36.3 C)  Resp: 20     Body mass index is 31.83 kg/(m^2).    ECOG FS:0 - Asymptomatic  General: Well-developed, well-nourished, no acute distress. Eyes: Pink conjunctiva, anicteric sclera. HEENT: Normocephalic, moist mucous membranes, clear oropharnyx. Breasts: Patient requested exam be deferred today. Lungs: Clear to auscultation bilaterally. Heart: Regular rate and rhythm. No rubs, murmurs, or gallops. Abdomen: Soft, nontender, nondistended. No organomegaly noted, normoactive bowel sounds. Musculoskeletal: No edema, cyanosis, or clubbing. Neuro: Alert, answering all questions appropriately. Cranial nerves grossly intact. Skin: No rashes or petechiae noted. Psych: Normal affect. Lymphatics: No cervical, calvicular, axillary or inguinal LAD.   LAB RESULTS:  Lab Results  Component Value Date   NA 141 05/12/2015   NA 140 05/12/2015   K 4.1 05/12/2015   K 4.1 05/12/2015   CL 105 05/12/2015   CL 104 05/12/2015   CO2 29 05/12/2015   CO2 29 05/12/2015   GLUCOSE 85 05/12/2015   GLUCOSE 83 05/12/2015   BUN 13 05/12/2015   BUN 13 05/12/2015   CREATININE 0.84 05/12/2015   CREATININE 0.82 05/12/2015   CALCIUM 9.1 05/12/2015   CALCIUM 9.1 05/12/2015   PROT 6.8 05/12/2015   ALBUMIN 3.9 05/12/2015   AST 20 05/12/2015   ALT 15 05/12/2015   ALKPHOS 82 05/12/2015   BILITOT 0.5 05/12/2015   GFRNONAA >60 05/12/2015   GFRNONAA >60 05/12/2015   GFRAA >60 05/12/2015   GFRAA >60 05/12/2015    Lab Results  Component Value Date   WBC 6.6 05/12/2015   HGB 12.7 05/12/2015   HCT 38.7 05/12/2015   MCV 94.7 05/12/2015   PLT 258 05/12/2015     STUDIES: X-ray Chest Pa Or Ap  05/16/2015   CLINICAL DATA:  Preoperative evaluation for breast carcinoma  EXAM: CHEST  1 VIEW  COMPARISON:  None.  FINDINGS: There is minimal scarring in the left base. There is no edema or consolidation. Heart size and pulmonary vascularity are normal. No adenopathy. No bone lesions.  IMPRESSION:  No edema or consolidation.  No demonstrable mass or adenopathy.   Electronically Signed   By: Lowella Grip III M.D.   On: 05/16/2015 14:42   Nm Sentinel Node Inj-no Rpt (breast)  05/19/2015   CLINICAL DATA: Breast cancer, left   Sulfur colloid was injected intradermally by the nuclear medicine  technologist for breast cancer sentinel node localization.    Mm Breast Surgical Specimen  05/19/2015   CLINICAL DATA:  Status post lumpectomy, left breast  EXAM: SPECIMEN RADIOGRAPH OF THE left BREAST  COMPARISON:  Previous exam(s).  FINDINGS: Status post excision of the left breast. The wire tip, mass, and biopsy marker clip are present and are marked for pathology. Findings discussed with Dr. Burt Knack by Dr. Nyoka Cowden by telephone at 12:15 p.m. on 05/19/2015.  IMPRESSION: Specimen radiograph of the left breast.   Electronically Signed   By: Conchita Paris M.D.   On: 05/19/2015 12:20   Dg Chest  Port 1 View  05/19/2015   CLINICAL DATA:  Status post port placement  EXAM: PORTABLE CHEST - 1 VIEW  COMPARISON:  05/16/2015  FINDINGS: Cardiac shadow is stable. A new left-sided chest wall port is seen with catheter tip at the cavoatrial junction in satisfactory position. No pneumothorax is noted. No focal infiltrate is seen. No bony abnormality is noted.  IMPRESSION: No post port placement pneumothorax.   Electronically Signed   By: Inez Catalina M.D.   On: 05/19/2015 14:30   Dg C-arm 61-120 Min-no Report  05/19/2015   CLINICAL DATA: surgery   C-ARM 61-120 MINUTES  Fluoroscopy was utilized by the requesting physician.  No radiographic  interpretation.    Mm Lt Plc Breast Loc Dev   1st Lesion  Inc Mammo Guide  05/19/2015   CLINICAL DATA:  Biopsy proven left breast invasive mammary carcinoma, preoperative localization  EXAM: NEEDLE LOCALIZATION OF THE left BREAST WITH MAMMO GUIDANCE  COMPARISON:  Previous exams.  FINDINGS: Patient presents for needle localization prior to lumpectomy. I met with the patient and we  discussed the procedure of needle localization including benefits and alternatives. We discussed the high likelihood of a successful procedure. We discussed the risks of the procedure, including infection, bleeding, tissue injury, and further surgery. Informed, written consent was given. The usual time-out protocol was performed immediately prior to the procedure.  Using mammographic guidance, sterile technique, 2% lidocaine and a 5 cm modified Kopans needle, the mass in the left breast 11 o'clock location was localized using a superior to inferior approach. The mass was originally described as being located in the 1230 location but the 11 o'clock location today is likely due to differences in positioning and technique. The images were marked for Dr. Burt Knack.  IMPRESSION: Needle localization left breast. No apparent complications.   Electronically Signed   By: Conchita Paris M.D.   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 Breast cancer: Patient has a lumpectomy scheduled for May 19, 2015. Given the triple negative status of her tumor, despite the fact that his stage I, she will require adjuvant chemotherapy likely using Adriamycin, Cytoxan, and Taxol. She will also likely require adjuvant XRT at the completion of her chemotherapy. Aromatase inhibitor would not offer her benefit given the triple negative status of her disease.  Have recommended that patient have a port placed at the time of her lumpectomy. Patient will return to clinic at the end of June for further evaluation and treatment planning. 2. Anxiety: Monitor.  Patient expressed understanding and was in agreement with this plan. She also understands that She can call clinic at any time with any questions, concerns, or complaints.   Breast cancer   Staging form: Breast, AJCC 7th Edition     Clinical stage from 06/04/2015: Stage IA (T1c, N0, M0) - Signed by Lloyd Huger, MD on 06/04/2015   Lloyd Huger, MD   06/04/2015 5:03 PM

## 2015-06-07 ENCOUNTER — Telehealth: Payer: Self-pay

## 2015-06-07 ENCOUNTER — Inpatient Hospital Stay: Payer: No Typology Code available for payment source | Attending: Oncology

## 2015-06-07 ENCOUNTER — Other Ambulatory Visit: Payer: Self-pay

## 2015-06-07 DIAGNOSIS — C50912 Malignant neoplasm of unspecified site of left female breast: Secondary | ICD-10-CM | POA: Insufficient documentation

## 2015-06-07 DIAGNOSIS — Z5111 Encounter for antineoplastic chemotherapy: Secondary | ICD-10-CM | POA: Insufficient documentation

## 2015-06-07 DIAGNOSIS — Z171 Estrogen receptor negative status [ER-]: Secondary | ICD-10-CM | POA: Insufficient documentation

## 2015-06-07 DIAGNOSIS — Z9012 Acquired absence of left breast and nipple: Secondary | ICD-10-CM | POA: Insufficient documentation

## 2015-06-07 DIAGNOSIS — F419 Anxiety disorder, unspecified: Secondary | ICD-10-CM | POA: Insufficient documentation

## 2015-06-07 DIAGNOSIS — Z01818 Encounter for other preprocedural examination: Secondary | ICD-10-CM | POA: Insufficient documentation

## 2015-06-07 DIAGNOSIS — R300 Dysuria: Secondary | ICD-10-CM

## 2015-06-07 DIAGNOSIS — I10 Essential (primary) hypertension: Secondary | ICD-10-CM | POA: Insufficient documentation

## 2015-06-07 DIAGNOSIS — Z809 Family history of malignant neoplasm, unspecified: Secondary | ICD-10-CM | POA: Insufficient documentation

## 2015-06-07 NOTE — Telephone Encounter (Signed)
patient is having urinary frequency and burning.  She has been taking OTC meds and drinking cranberry juice with no relief.

## 2015-06-07 NOTE — Telephone Encounter (Signed)
Left message informing patient to come by the office for UA and culture.  I also entered scheduling order for lab only appt (v/o Dr. Grayland Ormond)

## 2015-06-08 ENCOUNTER — Other Ambulatory Visit: Payer: No Typology Code available for payment source

## 2015-06-08 ENCOUNTER — Other Ambulatory Visit: Payer: Self-pay | Admitting: Oncology

## 2015-06-08 ENCOUNTER — Inpatient Hospital Stay: Payer: No Typology Code available for payment source

## 2015-06-08 DIAGNOSIS — Z5111 Encounter for antineoplastic chemotherapy: Secondary | ICD-10-CM | POA: Diagnosis not present

## 2015-06-08 DIAGNOSIS — I10 Essential (primary) hypertension: Secondary | ICD-10-CM | POA: Diagnosis not present

## 2015-06-08 DIAGNOSIS — Z809 Family history of malignant neoplasm, unspecified: Secondary | ICD-10-CM | POA: Diagnosis not present

## 2015-06-08 DIAGNOSIS — F419 Anxiety disorder, unspecified: Secondary | ICD-10-CM | POA: Diagnosis not present

## 2015-06-08 DIAGNOSIS — R300 Dysuria: Secondary | ICD-10-CM

## 2015-06-08 DIAGNOSIS — Z01818 Encounter for other preprocedural examination: Secondary | ICD-10-CM | POA: Diagnosis not present

## 2015-06-08 DIAGNOSIS — C50912 Malignant neoplasm of unspecified site of left female breast: Secondary | ICD-10-CM | POA: Diagnosis not present

## 2015-06-08 DIAGNOSIS — Z171 Estrogen receptor negative status [ER-]: Secondary | ICD-10-CM | POA: Diagnosis not present

## 2015-06-08 DIAGNOSIS — C50919 Malignant neoplasm of unspecified site of unspecified female breast: Secondary | ICD-10-CM

## 2015-06-08 DIAGNOSIS — Z9012 Acquired absence of left breast and nipple: Secondary | ICD-10-CM | POA: Diagnosis not present

## 2015-06-08 LAB — URINALYSIS COMPLETE WITH MICROSCOPIC (ARMC ONLY)
BACTERIA UA: NONE SEEN
Bilirubin Urine: NEGATIVE
GLUCOSE, UA: NEGATIVE mg/dL
Hgb urine dipstick: NEGATIVE
Ketones, ur: NEGATIVE mg/dL
NITRITE: NEGATIVE
Protein, ur: NEGATIVE mg/dL
Specific Gravity, Urine: 1.015 (ref 1.005–1.030)
pH: 6 (ref 5.0–8.0)

## 2015-06-08 NOTE — Progress Notes (Signed)
[place]  Telephone:(336) [phone] Fax:(336) [phone]  ID: Brittany Ryan OB: [birth year]  MR#: [id number]  XTG#:[id number]  Patient Care Team: [name], NP as PCP - General (Nurse Practitioner) [name], NP (Nurse Practitioner) Florene Glen, MD (Surgery)  CHIEF COMPLAINT:  Chief Complaint  Patient presents with  . Follow-up    breast cancer    INTERVAL HISTORY: Patient returns to clinic today for further evaluation, discussion of her final pathology results, and treatment planning. Currently, she is highly anxious but otherwise feels well. She has no neurologic complaints. She denies any recent fevers or illnesses. She has a good appetite and denies weight loss. She denies any pain. She has no chest pain or shortness of breath. She denies any nausea, vomiting, constipation, or diarrhea. She has no urinary complaints. Patient otherwise feels well and offers no further specific complaints.  REVIEW OF SYSTEMS:   Review of Systems  Constitutional: Negative.   Psychiatric/Behavioral: The patient is nervous/anxious.     As per HPI. Otherwise, a complete review of systems is negatve.  PAST MEDICAL HISTORY: Past Medical History  Diagnosis Date  . Breast cancer   . Seasonal allergies   . Frequent sinus infections   . Hypertension     PAST SURGICAL HISTORY: Past Surgical History  Procedure Laterality Date  . Breast biopsy Left 04/27/2015    results unknown  . Mastectomy, partial Left 05/19/2015    Procedure: MASTECTOMY PARTIAL;  Surgeon: Florene Glen, MD;  Location: [facility];  Service: General;  Laterality: Left;  . Breast lumpectomy with needle localization Left 05/19/2015    Procedure: BREAST LUMPECTOMY WITH NEEDLE LOCALIZATION;  Surgeon: Florene Glen, MD;  Location: [facility];  Service: General;  Laterality: Left;  . Breast lumpectomy with sentinel lymph node biopsy Left 05/19/2015    Procedure: BREAST LUMPECTOMY WITH SENTINEL LYMPH NODE BX;   Surgeon: Florene Glen, MD;  Location: [facility];  Service: General;  Laterality: Left;  . Portacath placement Left 05/19/2015    Procedure: INSERTION PORT-A-CATH;  Surgeon: Florene Glen, MD;  Location: [facility];  Service: General;  Laterality: Left;    FAMILY HISTORY Family History  Problem Relation Age of Onset  . Cancer Father   . Hypertension Father   . CAD Father   . Heart attack Father        ADVANCED DIRECTIVES:    HEALTH MAINTENANCE: History  Substance Use Topics  . Smoking status: Never Smoker   . Smokeless tobacco: Never Used  . Alcohol Use: Yes     Comment: rare beer     Colonoscopy:  PAP:  Bone density:  Lipid panel:  No Known Allergies  Current Outpatient Prescriptions  Medication Sig Dispense Refill  . acetaminophen (TYLENOL) 500 MG tablet Take 1,000 mg by mouth every 6 (six) hours as needed for mild pain or headache.    . B Complex Vitamins (VITAMIN B COMPLEX PO) Take 1 tablet by mouth daily as needed.    . Calcium Carbonate-Vitamin D (CALCIUM-VITAMIN D) 500-200 MG-UNIT per tablet Take 1 tablet by mouth daily.    . diphenhydramine-acetaminophen (TYLENOL PM) 25-500 MG TABS Take 1-2 tablets by mouth at bedtime as needed.    . fluticasone (FLONASE) 50 MCG/ACT nasal spray Place into the nose.    Marland Kitchen Hydrocodone-Acetaminophen (VICODIN) 5-300 MG TABS Take 1 tablet by mouth every 4 (four) hours as needed. 30 each 0  . loratadine (CLARITIN) 10 MG tablet Take by mouth.    Marland Kitchen  ondansetron (ZOFRAN-ODT) 4 MG disintegrating tablet Take 1 tablet (4 mg total) by mouth every 8 (eight) hours as needed for nausea or vomiting. 20 tablet 0  . lidocaine-prilocaine (EMLA) cream Apply 1 application topically as needed. Apply to port and cover with saran wrap 1-2 hours before chemotherapy 30 g 2   No current facility-administered medications for this visit.    OBJECTIVE: Filed Vitals:   06/01/15 1138  BP: 135/75  Pulse: 62  Temp: 96.4 F (35.8 C)  Resp: 18     Body  mass index is 32.09 kg/(m^2).    ECOG FS:0 - Asymptomatic  General: Well-developed, well-nourished, no acute distress. Eyes: anicteric sclera. HEENT: Normocephalic, moist mucous membranes, clear oropharnyx. Breasts: Patient requested exam be deferred today. Lungs: Clear to auscultation bilaterally. Heart: Regular rate and rhythm. No rubs, murmurs, or gallops. Abdomen: Soft, nontender, nondistended. No organomegaly noted, normoactive bowel sounds. Musculoskeletal: No edema, cyanosis, or clubbing. Neuro: Alert, answering all questions appropriately. Cranial nerves grossly intact. Skin: No rashes or petechiae noted. Psych: Normal affect.   LAB RESULTS:  Lab Results  Component Value Date   NA 141 05/12/2015   NA 140 05/12/2015   K 4.1 05/12/2015   K 4.1 05/12/2015   CL 105 05/12/2015   CL 104 05/12/2015   CO2 29 05/12/2015   CO2 29 05/12/2015   GLUCOSE 85 05/12/2015   GLUCOSE 83 05/12/2015   BUN 13 05/12/2015   BUN 13 05/12/2015   CREATININE 0.84 05/12/2015   CREATININE 0.82 05/12/2015   CALCIUM 9.1 05/12/2015   CALCIUM 9.1 05/12/2015   PROT 6.8 05/12/2015   ALBUMIN 3.9 05/12/2015   AST 20 05/12/2015   ALT 15 05/12/2015   ALKPHOS 82 05/12/2015   BILITOT 0.5 05/12/2015   GFRNONAA >60 05/12/2015   GFRNONAA >60 05/12/2015   GFRAA >60 05/12/2015   GFRAA >60 05/12/2015    Lab Results  Component Value Date   WBC 6.6 05/12/2015   HGB 12.7 05/12/2015   HCT 38.7 05/12/2015   MCV 94.7 05/12/2015   PLT 258 05/12/2015     STUDIES: X-ray Chest Pa Or Ap  05/16/2015   CLINICAL DATA:  Preoperative evaluation for breast carcinoma  EXAM: CHEST  1 VIEW  COMPARISON:  None.  FINDINGS: There is minimal scarring in the left base. There is no edema or consolidation. Heart size and pulmonary vascularity are normal. No adenopathy. No bone lesions.  IMPRESSION: No edema or consolidation.  No demonstrable mass or adenopathy.   Electronically Signed   By: Lowella Grip III M.D.   On:  05/16/2015 14:42   Nm Sentinel Node Inj-no Rpt (breast)  05/19/2015   CLINICAL DATA: Breast cancer, left   Sulfur colloid was injected intradermally by the nuclear medicine  technologist for breast cancer sentinel node localization.    Mm Breast Surgical Specimen  05/19/2015   CLINICAL DATA:  Status post lumpectomy, left breast  EXAM: SPECIMEN RADIOGRAPH OF THE left BREAST  COMPARISON:  Previous exam(s).  FINDINGS: Status post excision of the left breast. The wire tip, mass, and biopsy marker clip are present and are marked for pathology. Findings discussed with Dr. Burt Knack by Dr. Nyoka Cowden by telephone at 12:15 p.m. on 05/19/2015.  IMPRESSION: Specimen radiograph of the left breast.   Electronically Signed   By: Conchita Paris M.D.   On: 05/19/2015 12:20   Dg Chest Port 1 View  05/19/2015   CLINICAL DATA:  Status post port placement  EXAM: PORTABLE CHEST - 1 VIEW  COMPARISON:  05/16/2015  FINDINGS: Cardiac shadow is stable. A new left-sided chest wall port is seen with catheter tip at the cavoatrial junction in satisfactory position. No pneumothorax is noted. No focal infiltrate is seen. No bony abnormality is noted.  IMPRESSION: No post port placement pneumothorax.   Electronically Signed   By: Inez Catalina M.D.   On: 05/19/2015 14:30   Dg C-arm 61-120 Min-no Report  05/19/2015   CLINICAL DATA: surgery   C-ARM 61-120 MINUTES  Fluoroscopy was utilized by the requesting physician.  No radiographic  interpretation.    Mm Lt Plc Breast Loc Dev   1st Lesion  Inc Mammo Guide  05/19/2015   CLINICAL DATA:  Biopsy proven left breast invasive mammary carcinoma, preoperative localization  EXAM: NEEDLE LOCALIZATION OF THE left BREAST WITH MAMMO GUIDANCE  COMPARISON:  Previous exams.  FINDINGS: Patient presents for needle localization prior to lumpectomy. I met with the patient and we discussed the procedure of needle localization including benefits and alternatives. We discussed the high likelihood of a successful  procedure. We discussed the risks of the procedure, including infection, bleeding, tissue injury, and further surgery. Informed, written consent was given. The usual time-out protocol was performed immediately prior to the procedure.  Using mammographic guidance, sterile technique, 2% lidocaine and a 5 cm modified Kopans needle, the mass in the left breast 11 o'clock location was localized using a superior to inferior approach. The mass was originally described as being located in the 1230 location but the 11 o'clock location today is likely due to differences in positioning and technique. The images were marked for Dr. Burt Knack.  IMPRESSION: Needle localization left breast. No apparent complications.   Electronically Signed   By: Conchita Paris M.D.   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 Breast cancer: Given patient's triple negative status of her disease, she will require adjuvant chemotherapy using dose dense Adriamycin plus Cytoxan followed by weekly Taxol. Prior to initiating treatment patient will require MUGA as well as chemotherapy class. She has requested that her treatments occur in the [facility] clinic, therefore she will return on June 10, 2015 to initiate cycle 1 of 4 of Adriamycin and Cytoxan. She will also require Neulasta with her treatments. At the conclusion of her chemotherapy, patient will require adjuvant XRT. An aromatase inhibitor would not offer benefit given the triple negative status of her disease.  2. Anxiety: Monitor.  Approximately 30 minutes was spent in discussion and consultation.  Patient expressed understanding and was in agreement with this plan. She also understands that She can call clinic at any time with any questions, concerns, or complaints.   Breast cancer   Staging form: Breast, AJCC 7th Edition     Clinical stage from 06/04/2015: Stage IA (T1c, N0, M0) - Signed by Lloyd Huger, MD on 06/04/2015   Lloyd Huger, MD   06/08/2015 9:49 AM

## 2015-06-09 ENCOUNTER — Ambulatory Visit
Admission: RE | Admit: 2015-06-09 | Discharge: 2015-06-09 | Disposition: A | Discharge status: Home / Self Care | Payer: No Typology Code available for payment source | Source: Ambulatory Visit | Attending: Oncology | Admitting: Oncology

## 2015-06-09 DIAGNOSIS — C50919 Malignant neoplasm of unspecified site of unspecified female breast: Secondary | ICD-10-CM | POA: Insufficient documentation

## 2015-06-09 MED ORDER — TECHNETIUM TC 99M-LABELED RED BLOOD CELLS IV KIT
22.9780 | PACK | Freq: Once | INTRAVENOUS | Status: AC | PRN
  Administered 2015-06-09: 22.978 via INTRAVENOUS

## 2015-06-10 ENCOUNTER — Inpatient Hospital Stay: Payer: No Typology Code available for payment source

## 2015-06-10 ENCOUNTER — Inpatient Hospital Stay (HOSPITAL_BASED_OUTPATIENT_CLINIC_OR_DEPARTMENT_OTHER): Payer: No Typology Code available for payment source | Admitting: Oncology

## 2015-06-10 VITALS — BP 132/83 | HR 63 | Temp 98.0°F | Wt 215.0 lb

## 2015-06-10 DIAGNOSIS — C50912 Malignant neoplasm of unspecified site of left female breast: Secondary | ICD-10-CM

## 2015-06-10 DIAGNOSIS — I10 Essential (primary) hypertension: Secondary | ICD-10-CM | POA: Diagnosis not present

## 2015-06-10 DIAGNOSIS — Z9012 Acquired absence of left breast and nipple: Secondary | ICD-10-CM

## 2015-06-10 DIAGNOSIS — C50919 Malignant neoplasm of unspecified site of unspecified female breast: Secondary | ICD-10-CM

## 2015-06-10 DIAGNOSIS — Z01818 Encounter for other preprocedural examination: Secondary | ICD-10-CM

## 2015-06-10 DIAGNOSIS — Z171 Estrogen receptor negative status [ER-]: Secondary | ICD-10-CM

## 2015-06-10 DIAGNOSIS — F419 Anxiety disorder, unspecified: Secondary | ICD-10-CM | POA: Diagnosis not present

## 2015-06-10 DIAGNOSIS — Z809 Family history of malignant neoplasm, unspecified: Secondary | ICD-10-CM

## 2015-06-10 LAB — CBC WITH DIFFERENTIAL/PLATELET
Basophils Absolute: 0.2 10*3/uL — ABNORMAL HIGH (ref 0–0.1)
Basophils Relative: 3 %
Eosinophils Absolute: 0.4 10*3/uL (ref 0–0.7)
Eosinophils Relative: 6 %
HEMATOCRIT: 37.1 % (ref 35.0–47.0)
HEMOGLOBIN: 12.5 g/dL (ref 12.0–16.0)
LYMPHS ABS: 2 10*3/uL (ref 1.0–3.6)
Lymphocytes Relative: 29 %
MCH: 31.1 pg (ref 26.0–34.0)
MCHC: 33.6 g/dL (ref 32.0–36.0)
MCV: 92.7 fL (ref 80.0–100.0)
MONO ABS: 0.7 10*3/uL (ref 0.2–0.9)
MONOS PCT: 9 %
NEUTROS ABS: 3.7 10*3/uL (ref 1.4–6.5)
Neutrophils Relative %: 53 %
Platelets: 272 10*3/uL (ref 150–440)
RBC: 4 MIL/uL (ref 3.80–5.20)
RDW: 13.4 % (ref 11.5–14.5)
WBC: 7 10*3/uL (ref 3.6–11.0)

## 2015-06-10 LAB — URINE CULTURE: Culture: NO GROWTH

## 2015-06-10 LAB — COMPREHENSIVE METABOLIC PANEL
ALT: 15 U/L (ref 14–54)
AST: 19 U/L (ref 15–41)
Albumin: 3.8 g/dL (ref 3.5–5.0)
Alkaline Phosphatase: 77 U/L (ref 38–126)
Anion gap: 9 (ref 5–15)
BUN: 13 mg/dL (ref 6–20)
CALCIUM: 8.9 mg/dL (ref 8.9–10.3)
CHLORIDE: 101 mmol/L (ref 101–111)
CO2: 27 mmol/L (ref 22–32)
Creatinine, Ser: 0.69 mg/dL (ref 0.44–1.00)
GLUCOSE: 95 mg/dL (ref 65–99)
POTASSIUM: 4 mmol/L (ref 3.5–5.1)
SODIUM: 137 mmol/L (ref 135–145)
Total Bilirubin: 0.6 mg/dL (ref 0.3–1.2)
Total Protein: 7.1 g/dL (ref 6.5–8.1)

## 2015-06-10 MED ORDER — HEPARIN SOD (PORK) LOCK FLUSH 100 UNIT/ML IV SOLN
500.0000 [IU] | Freq: Once | INTRAVENOUS | Status: AC | PRN
  Administered 2015-06-10: 500 [IU]
  Filled 2015-06-10: qty 5

## 2015-06-10 MED ORDER — SODIUM CHLORIDE 0.9 % IV SOLN
600.0000 mg/m2 | Freq: Once | INTRAVENOUS | Status: AC
  Administered 2015-06-10: 1320 mg via INTRAVENOUS
  Filled 2015-06-10: qty 66

## 2015-06-10 MED ORDER — PALONOSETRON HCL INJECTION 0.25 MG/5ML
0.2500 mg | Freq: Once | INTRAVENOUS | Status: AC
  Administered 2015-06-10: 0.25 mg via INTRAVENOUS
  Filled 2015-06-10: qty 5

## 2015-06-10 MED ORDER — DOXORUBICIN HCL CHEMO IV INJECTION 2 MG/ML
60.0000 mg/m2 | Freq: Once | INTRAVENOUS | Status: AC
  Administered 2015-06-10: 132 mg via INTRAVENOUS
  Filled 2015-06-10: qty 66

## 2015-06-10 MED ORDER — SODIUM CHLORIDE 0.9 % IJ SOLN
10.0000 mL | INTRAMUSCULAR | Status: DC | PRN
  Administered 2015-06-10: 10 mL
  Filled 2015-06-10: qty 10

## 2015-06-10 MED ORDER — SODIUM CHLORIDE 0.9 % IV SOLN
Freq: Once | INTRAVENOUS | Status: AC
  Administered 2015-06-10: 10:00:00 via INTRAVENOUS
  Filled 2015-06-10: qty 1000

## 2015-06-10 MED ORDER — PEGFILGRASTIM 6 MG/0.6ML ~~LOC~~ PSKT
6.0000 mg | PREFILLED_SYRINGE | Freq: Once | SUBCUTANEOUS | Status: AC
  Administered 2015-06-10: 6 mg via SUBCUTANEOUS

## 2015-06-10 MED ORDER — SODIUM CHLORIDE 0.9 % IV SOLN
Freq: Once | INTRAVENOUS | Status: AC
  Administered 2015-06-10: 10:00:00 via INTRAVENOUS
  Filled 2015-06-10: qty 5

## 2015-06-13 ENCOUNTER — Telehealth: Payer: Self-pay | Admitting: *Deleted

## 2015-06-13 MED ORDER — PROCHLORPERAZINE MALEATE 10 MG PO TABS: 10.0000 mg | ORAL_TABLET | Freq: Four times a day (QID) | ORAL | Status: DC | PRN

## 2015-06-13 NOTE — Telephone Encounter (Signed)
Compazine e scribed to [place] per VO Dr Grayland Ormond. Pt informed

## 2015-06-17 ENCOUNTER — Inpatient Hospital Stay: Payer: No Typology Code available for payment source

## 2015-06-17 DIAGNOSIS — C50912 Malignant neoplasm of unspecified site of left female breast: Secondary | ICD-10-CM | POA: Diagnosis not present

## 2015-06-17 DIAGNOSIS — C50919 Malignant neoplasm of unspecified site of unspecified female breast: Secondary | ICD-10-CM

## 2015-06-17 LAB — CBC WITH DIFFERENTIAL/PLATELET
Basophils Absolute: 0 10*3/uL (ref 0–0.1)
Basophils Relative: 1 %
Eosinophils Absolute: 0.3 10*3/uL (ref 0–0.7)
Eosinophils Relative: 12 %
HCT: 36.7 % (ref 35.0–47.0)
Hemoglobin: 12.3 g/dL (ref 12.0–16.0)
Lymphocytes Relative: 40 %
Lymphs Abs: 1.1 10*3/uL (ref 1.0–3.6)
MCH: 31.1 pg (ref 26.0–34.0)
MCHC: 33.5 g/dL (ref 32.0–36.0)
MCV: 92.9 fL (ref 80.0–100.0)
Monocytes Absolute: 0.2 10*3/uL (ref 0.2–0.9)
Monocytes Relative: 7 %
Neutro Abs: 1.1 10*3/uL — ABNORMAL LOW (ref 1.4–6.5)
PLATELETS: 174 10*3/uL (ref 150–440)
RBC: 3.95 MIL/uL (ref 3.80–5.20)
RDW: 12.7 % (ref 11.5–14.5)
WBC: 2.8 10*3/uL — AB (ref 3.6–11.0)

## 2015-06-17 LAB — COMPREHENSIVE METABOLIC PANEL
ALBUMIN: 3.9 g/dL (ref 3.5–5.0)
ALK PHOS: 105 U/L (ref 38–126)
ALT: 15 U/L (ref 14–54)
AST: 17 U/L (ref 15–41)
Anion gap: 12 (ref 5–15)
BILIRUBIN TOTAL: 0.5 mg/dL (ref 0.3–1.2)
BUN: 15 mg/dL (ref 6–20)
CHLORIDE: 96 mmol/L — AB (ref 101–111)
CO2: 29 mmol/L (ref 22–32)
Calcium: 9.3 mg/dL (ref 8.9–10.3)
Creatinine, Ser: 0.73 mg/dL (ref 0.44–1.00)
GFR calc Af Amer: >60 mL/min (ref >60)
Glucose, Bld: 110 mg/dL — ABNORMAL HIGH (ref 65–99)
Potassium: 4.5 mmol/L (ref 3.5–5.1)
Sodium: 137 mmol/L (ref 135–145)
TOTAL PROTEIN: 6.9 g/dL (ref 6.5–8.1)

## 2015-06-27 NOTE — Progress Notes (Signed)
[place]  Telephone:(336) [phone] Fax:(336) [phone]  ID: Brittany Ryan OB: 13-Dec-1960  MR#: [id number]  QJF#:[id number]  Patient Care Team: [name], NP as PCP - General (Nurse Practitioner) [name], NP (Nurse Practitioner) Florene Glen, MD (Surgery)  CHIEF COMPLAINT:  Chief Complaint  Patient presents with  . Follow-up    INTERVAL HISTORY: Patient returns to clinic today for further evaluation and initiation of cycle 1 of 4 of Adriamycin and Cytoxan. She continues to be highly anxious but otherwise feels well. She has no neurologic complaints. She denies any recent fevers or illnesses. She has a good appetite and denies weight loss. She denies any pain. She has no chest pain or shortness of breath. She denies any nausea, vomiting, constipation, or diarrhea. She has no urinary complaints. Patient offers no further specific complaints.  REVIEW OF SYSTEMS:   Review of Systems  Constitutional: Negative.   Psychiatric/Behavioral: The patient is nervous/anxious.     As per HPI. Otherwise, a complete review of systems is negatve.  PAST MEDICAL HISTORY: Past Medical History  Diagnosis Date  . Breast cancer   . Seasonal allergies   . Frequent sinus infections   . Hypertension     PAST SURGICAL HISTORY: Past Surgical History  Procedure Laterality Date  . Breast biopsy Left 04/27/2015    results unknown  . Mastectomy, partial Left 05/19/2015    Procedure: MASTECTOMY PARTIAL;  Surgeon: Florene Glen, MD;  Location: [facility] ORS;  Service: General;  Laterality: Left;  . Breast lumpectomy with needle localization Left 05/19/2015    Procedure: BREAST LUMPECTOMY WITH NEEDLE LOCALIZATION;  Surgeon: Florene Glen, MD;  Location: [facility] ORS;  Service: General;  Laterality: Left;  . Breast lumpectomy with sentinel lymph node biopsy Left 05/19/2015    Procedure: BREAST LUMPECTOMY WITH SENTINEL LYMPH NODE BX;  Surgeon: Florene Glen, MD;  Location: [facility]  ORS;  Service: General;  Laterality: Left;  . Portacath placement Left 05/19/2015    Procedure: INSERTION PORT-A-CATH;  Surgeon: Florene Glen, MD;  Location: [facility] ORS;  Service: General;  Laterality: Left;    FAMILY HISTORY Family History  Problem Relation Age of Onset  . Cancer Father   . Hypertension Father   . CAD Father   . Heart attack Father        ADVANCED DIRECTIVES:    HEALTH MAINTENANCE: History  Substance Use Topics  . Smoking status: Never Smoker   . Smokeless tobacco: Never Used  . Alcohol Use: Yes     Comment: rare beer     Colonoscopy:  PAP:  Bone density:  Lipid panel:  No Known Allergies  Current Outpatient Prescriptions  Medication Sig Dispense Refill  . B Complex Vitamins (VITAMIN B COMPLEX PO) Take 1 tablet by mouth daily as needed.    . Calcium Carbonate-Vitamin D (CALCIUM-VITAMIN D) 500-200 MG-UNIT per tablet Take 1 tablet by mouth daily.    Marland Kitchen lidocaine-prilocaine (EMLA) cream Apply 1 application topically as needed. Apply to port and cover with saran wrap 1-2 hours before chemotherapy 30 g 2  . loratadine (CLARITIN) 10 MG tablet Take by mouth.    . ondansetron (ZOFRAN-ODT) 4 MG disintegrating tablet Take 1 tablet (4 mg total) by mouth every 8 (eight) hours as needed for nausea or vomiting. 20 tablet 0  . polyethylene glycol powder (GLYCOLAX/MIRALAX) powder Take 1 Container by mouth once.    . senna (SENOKOT) 8.6 MG TABS tablet Take 1 tablet by mouth.    Marland Kitchen  acetaminophen (TYLENOL) 500 MG tablet Take 1,000 mg by mouth every 6 (six) hours as needed for mild pain or headache.    . diphenhydramine-acetaminophen (TYLENOL PM) 25-500 MG TABS Take 1-2 tablets by mouth at bedtime as needed.    . fluticasone (FLONASE) 50 MCG/ACT nasal spray Place into the nose.    Marland Kitchen Hydrocodone-Acetaminophen (VICODIN) 5-300 MG TABS Take 1 tablet by mouth every 4 (four) hours as needed. (Patient not taking: Reported on 06/10/2015) 30 each 0  . prochlorperazine (COMPAZINE) 10  MG tablet Take 1 tablet (10 mg total) by mouth every 6 (six) hours as needed for nausea or vomiting. 60 tablet 0   No current facility-administered medications for this visit.    OBJECTIVE: Filed Vitals:   06/10/15 0901  BP: 132/83  Pulse: 63  Temp: 98 F (36.7 C)     Body mass index is 31.74 kg/(m^2).    ECOG FS:0 - Asymptomatic  General: Well-developed, well-nourished, no acute distress. Eyes: anicteric sclera. Breasts: Patient requested exam be deferred today. Lungs: Clear to auscultation bilaterally. Heart: Regular rate and rhythm. No rubs, murmurs, or gallops. Abdomen: Soft, nontender, nondistended. No organomegaly noted, normoactive bowel sounds. Musculoskeletal: No edema, cyanosis, or clubbing. Neuro: Alert, answering all questions appropriately. Cranial nerves grossly intact. Skin: No rashes or petechiae noted. Psych: Normal affect.   LAB RESULTS:  Lab Results  Component Value Date   NA 137 06/17/2015   K 4.5 06/17/2015   CL 96* 06/17/2015   CO2 29 06/17/2015   GLUCOSE 110* 06/17/2015   BUN 15 06/17/2015   CREATININE 0.73 06/17/2015   CALCIUM 9.3 06/17/2015   PROT 6.9 06/17/2015   ALBUMIN 3.9 06/17/2015   AST 17 06/17/2015   ALT 15 06/17/2015   ALKPHOS 105 06/17/2015   BILITOT 0.5 06/17/2015   GFRNONAA >60 06/17/2015   GFRAA >60 06/17/2015    Lab Results  Component Value Date   WBC 2.8* 06/17/2015   NEUTROABS 1.1* 06/17/2015   HGB 12.3 06/17/2015   HCT 36.7 06/17/2015   MCV 92.9 06/17/2015   PLT 174 06/17/2015     STUDIES: Nm Cardiac Muga Rest  06/09/2015   CLINICAL DATA:  Pre chemotherapy.  Breast cancer.  EXAM: NUCLEAR MEDICINE CARDIAC BLOOD POOL IMAGING (MUGA)  TECHNIQUE: Cardiac multi-gated acquisition was performed at rest following intravenous injection of Tc-14m labeled red blood cells.  RADIOPHARMACEUTICALS:  22.978 mCi Tc-2m MDP in-vitro labeled red blood cells IV  COMPARISON:  None.  FINDINGS: The left ventricular ejection fraction was  calculated equal 62.5%. Normal left ventricular wall motion.  IMPRESSION: 1. Left ventricular ejection fraction equals 62.5%.   Electronically Signed   By: Kerby Moors M.D.   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 Breast cancer: Given patient's triple negative status of her disease, she will require adjuvant chemotherapy using dose dense Adriamycin plus Cytoxan followed by weekly Taxol. MUGA as above is adequate to proceed with treatment. Proceed with cycle 1 of 4 of Adriamycin and Cytoxan. She will also require OnPro  with her treatments. At the conclusion of her chemotherapy, patient will require adjuvant XRT. An aromatase inhibitor would not offer benefit given the triple negative status of her disease. Return to clinic in 1 week for laboratory work and further evaluation. Patient has a preplanned trip, therefore she will return to clinic in 3 weeks rather than 2 for consideration of cycle 2. 2. Anxiety: Monitor.  Patient expressed understanding and was in agreement with this plan. She also understands that She can call clinic at any time with any questions, concerns, or complaints.   Breast cancer   Staging form: Breast, AJCC 7th Edition     Clinical stage from 06/04/2015: Stage IA (T1c, N0, M0) - Signed by Lloyd Huger, MD on 06/04/2015   Lloyd Huger, MD   06/27/2015 4:20 PM

## 2015-06-30 ENCOUNTER — Other Ambulatory Visit: Payer: Self-pay | Admitting: *Deleted

## 2015-06-30 MED ORDER — HYDROCOD POLST-CPM POLST ER 10-8 MG/5ML PO SUER: 5.0000 mL | Freq: Two times a day (BID) | ORAL | Status: DC | PRN

## 2015-07-01 ENCOUNTER — Inpatient Hospital Stay: Payer: No Typology Code available for payment source

## 2015-07-01 ENCOUNTER — Inpatient Hospital Stay (HOSPITAL_BASED_OUTPATIENT_CLINIC_OR_DEPARTMENT_OTHER): Payer: No Typology Code available for payment source | Admitting: Oncology

## 2015-07-01 VITALS — BP 127/67 | HR 64 | Temp 95.6°F | Resp 16

## 2015-07-01 DIAGNOSIS — I10 Essential (primary) hypertension: Secondary | ICD-10-CM | POA: Diagnosis not present

## 2015-07-01 DIAGNOSIS — Z9012 Acquired absence of left breast and nipple: Secondary | ICD-10-CM

## 2015-07-01 DIAGNOSIS — Z171 Estrogen receptor negative status [ER-]: Secondary | ICD-10-CM

## 2015-07-01 DIAGNOSIS — Z809 Family history of malignant neoplasm, unspecified: Secondary | ICD-10-CM

## 2015-07-01 DIAGNOSIS — C50919 Malignant neoplasm of unspecified site of unspecified female breast: Secondary | ICD-10-CM

## 2015-07-01 DIAGNOSIS — Z01818 Encounter for other preprocedural examination: Secondary | ICD-10-CM

## 2015-07-01 DIAGNOSIS — F419 Anxiety disorder, unspecified: Secondary | ICD-10-CM | POA: Diagnosis not present

## 2015-07-01 DIAGNOSIS — C50912 Malignant neoplasm of unspecified site of left female breast: Secondary | ICD-10-CM

## 2015-07-01 LAB — CBC WITH DIFFERENTIAL/PLATELET
BASOS ABS: 0.1 10*3/uL (ref 0–0.1)
BASOS PCT: 1 %
EOS ABS: 0.2 10*3/uL (ref 0–0.7)
EOS PCT: 2 %
HCT: 34.8 % — ABNORMAL LOW (ref 35.0–47.0)
HEMOGLOBIN: 11.7 g/dL — AB (ref 12.0–16.0)
LYMPHS ABS: 1.2 10*3/uL (ref 1.0–3.6)
LYMPHS PCT: 17 %
MCH: 31.3 pg (ref 26.0–34.0)
MCHC: 33.6 g/dL (ref 32.0–36.0)
MCV: 93.2 fL (ref 80.0–100.0)
MONOS PCT: 13 %
Monocytes Absolute: 0.9 10*3/uL (ref 0.2–0.9)
NEUTROS ABS: 4.9 10*3/uL (ref 1.4–6.5)
Neutrophils Relative %: 67 %
Platelets: 316 10*3/uL (ref 150–440)
RBC: 3.73 MIL/uL — ABNORMAL LOW (ref 3.80–5.20)
RDW: 13.6 % (ref 11.5–14.5)
WBC: 7.3 10*3/uL (ref 3.6–11.0)

## 2015-07-01 LAB — COMPREHENSIVE METABOLIC PANEL
ALBUMIN: 3.8 g/dL (ref 3.5–5.0)
ALT: 23 U/L (ref 14–54)
ANION GAP: 9 (ref 5–15)
AST: 27 U/L (ref 15–41)
Alkaline Phosphatase: 81 U/L (ref 38–126)
BILIRUBIN TOTAL: 0.5 mg/dL (ref 0.3–1.2)
BUN: 19 mg/dL (ref 6–20)
CALCIUM: 8.9 mg/dL (ref 8.9–10.3)
CO2: 26 mmol/L (ref 22–32)
CREATININE: 0.79 mg/dL (ref 0.44–1.00)
Chloride: 102 mmol/L (ref 101–111)
GFR calc Af Amer: >60 mL/min (ref >60)
GFR calc non Af Amer: >60 mL/min (ref >60)
Glucose, Bld: 127 mg/dL — ABNORMAL HIGH (ref 65–99)
POTASSIUM: 3.7 mmol/L (ref 3.5–5.1)
SODIUM: 137 mmol/L (ref 135–145)
Total Protein: 6.7 g/dL (ref 6.5–8.1)

## 2015-07-01 MED ORDER — PEGFILGRASTIM 6 MG/0.6ML ~~LOC~~ PSKT
6.0000 mg | PREFILLED_SYRINGE | Freq: Once | SUBCUTANEOUS | Status: AC
  Administered 2015-07-01: 6 mg via SUBCUTANEOUS

## 2015-07-01 MED ORDER — FOSAPREPITANT DIMEGLUMINE INJECTION 150 MG
Freq: Once | INTRAVENOUS | Status: AC
  Administered 2015-07-01: 10:00:00 via INTRAVENOUS
  Filled 2015-07-01: qty 5

## 2015-07-01 MED ORDER — SODIUM CHLORIDE 0.9 % IJ SOLN
10.0000 mL | INTRAMUSCULAR | Status: DC | PRN
  Administered 2015-07-01: 10 mL via INTRAVENOUS
  Filled 2015-07-01: qty 10

## 2015-07-01 MED ORDER — PALONOSETRON HCL INJECTION 0.25 MG/5ML
0.2500 mg | Freq: Once | INTRAVENOUS | Status: AC
  Administered 2015-07-01: 0.25 mg via INTRAVENOUS
  Filled 2015-07-01: qty 5

## 2015-07-01 MED ORDER — HEPARIN SOD (PORK) LOCK FLUSH 100 UNIT/ML IV SOLN
500.0000 [IU] | Freq: Once | INTRAVENOUS | Status: AC
  Administered 2015-07-01: 500 [IU] via INTRAVENOUS
  Filled 2015-07-01: qty 5

## 2015-07-01 MED ORDER — DOXORUBICIN HCL CHEMO IV INJECTION 2 MG/ML
60.0000 mg/m2 | Freq: Once | INTRAVENOUS | Status: AC
  Administered 2015-07-01: 132 mg via INTRAVENOUS
  Filled 2015-07-01: qty 66

## 2015-07-01 MED ORDER — SODIUM CHLORIDE 0.9 % IV SOLN
600.0000 mg/m2 | Freq: Once | INTRAVENOUS | Status: AC
  Administered 2015-07-01: 1320 mg via INTRAVENOUS
  Filled 2015-07-01: qty 66

## 2015-07-01 MED ORDER — SODIUM CHLORIDE 0.9 % IV SOLN
Freq: Once | INTRAVENOUS | Status: AC
  Administered 2015-07-01: 10:00:00 via INTRAVENOUS
  Filled 2015-07-01: qty 1000

## 2015-07-07 NOTE — Progress Notes (Signed)
[place]  Telephone:(336) [phone] Fax:(336) [phone]  ID: Brittany Ryan OB: 13-Dec-1960  MR#: [id number]  BBC#:[id number]  Patient Care Team: [name], NP as PCP - General (Nurse Practitioner) [name], NP (Nurse Practitioner) Florene Glen, MD (Surgery)  CHIEF COMPLAINT:  No chief complaint on file.   INTERVAL HISTORY: Patient returns to clinic today for further evaluation and consideration of cycle 2 of 4 of Adriamycin and Cytoxan. She continues to be highly anxious but otherwise feels well. She has no neurologic complaints. She denies any recent fevers or illnesses. She has a good appetite and denies weight loss. She denies any pain. She has no chest pain or shortness of breath. She denies any nausea, vomiting, constipation, or diarrhea. She has no urinary complaints. Patient offers no further specific complaints.  REVIEW OF SYSTEMS:   Review of Systems  Constitutional: Negative.   Psychiatric/Behavioral: The patient is nervous/anxious.     As per HPI. Otherwise, a complete review of systems is negatve.  PAST MEDICAL HISTORY: Past Medical History  Diagnosis Date  . Breast cancer   . Seasonal allergies   . Frequent sinus infections   . Hypertension     PAST SURGICAL HISTORY: Past Surgical History  Procedure Laterality Date  . Breast biopsy Left 04/27/2015    results unknown  . Mastectomy, partial Left 05/19/2015    Procedure: MASTECTOMY PARTIAL;  Surgeon: Florene Glen, MD;  Location: [facility] ORS;  Service: General;  Laterality: Left;  . Breast lumpectomy with needle localization Left 05/19/2015    Procedure: BREAST LUMPECTOMY WITH NEEDLE LOCALIZATION;  Surgeon: Florene Glen, MD;  Location: [facility] ORS;  Service: General;  Laterality: Left;  . Breast lumpectomy with sentinel lymph node biopsy Left 05/19/2015    Procedure: BREAST LUMPECTOMY WITH SENTINEL LYMPH NODE BX;  Surgeon: Florene Glen, MD;  Location: [facility] ORS;  Service: General;   Laterality: Left;  . Portacath placement Left 05/19/2015    Procedure: INSERTION PORT-A-CATH;  Surgeon: Florene Glen, MD;  Location: [facility] ORS;  Service: General;  Laterality: Left;    FAMILY HISTORY Family History  Problem Relation Age of Onset  . Cancer Father   . Hypertension Father   . CAD Father   . Heart attack Father        ADVANCED DIRECTIVES:    HEALTH MAINTENANCE: History  Substance Use Topics  . Smoking status: Never Smoker   . Smokeless tobacco: Never Used  . Alcohol Use: Yes     Comment: rare beer     Colonoscopy:  PAP:  Bone density:  Lipid panel:  No Known Allergies  Current Outpatient Prescriptions  Medication Sig Dispense Refill  . acetaminophen (TYLENOL) 500 MG tablet Take 1,000 mg by mouth every 6 (six) hours as needed for mild pain or headache.    . B Complex Vitamins (VITAMIN B COMPLEX PO) Take 1 tablet by mouth daily as needed.    . Calcium Carbonate-Vitamin D (CALCIUM-VITAMIN D) 500-200 MG-UNIT per tablet Take 1 tablet by mouth daily.    . chlorpheniramine-HYDROcodone (TUSSIONEX) 10-8 MG/5ML SUER Take 5 mLs by mouth every 12 (twelve) hours as needed for cough. 140 mL 0  . diphenhydramine-acetaminophen (TYLENOL PM) 25-500 MG TABS Take 1-2 tablets by mouth at bedtime as needed.    . fluticasone (FLONASE) 50 MCG/ACT nasal spray Place into the nose.    Marland Kitchen Hydrocodone-Acetaminophen (VICODIN) 5-300 MG TABS Take 1 tablet by mouth every 4 (four) hours as needed. (Patient not taking: Reported  on 06/10/2015) 30 each 0  . lidocaine-prilocaine (EMLA) cream Apply 1 application topically as needed. Apply to port and cover with saran wrap 1-2 hours before chemotherapy 30 g 2  . loratadine (CLARITIN) 10 MG tablet Take by mouth.    . ondansetron (ZOFRAN-ODT) 4 MG disintegrating tablet Take 1 tablet (4 mg total) by mouth every 8 (eight) hours as needed for nausea or vomiting. 20 tablet 0  . polyethylene glycol powder (GLYCOLAX/MIRALAX) powder Take 1 Container by  mouth once.    . prochlorperazine (COMPAZINE) 10 MG tablet Take 1 tablet (10 mg total) by mouth every 6 (six) hours as needed for nausea or vomiting. 60 tablet 0  . senna (SENOKOT) 8.6 MG TABS tablet Take 1 tablet by mouth.     No current facility-administered medications for this visit.    OBJECTIVE: There were no vitals filed for this visit.   There is no weight on file to calculate BMI.    ECOG FS:0 - Asymptomatic  General: Well-developed, well-nourished, no acute distress. Eyes: anicteric sclera. Breasts: Patient requested exam be deferred today. Lungs: Clear to auscultation bilaterally. Heart: Regular rate and rhythm. No rubs, murmurs, or gallops. Abdomen: Soft, nontender, nondistended. No organomegaly noted, normoactive bowel sounds. Musculoskeletal: No edema, cyanosis, or clubbing. Neuro: Alert, answering all questions appropriately. Cranial nerves grossly intact. Skin: No rashes or petechiae noted. Psych: Normal affect.   LAB RESULTS:  Lab Results  Component Value Date   NA 137 07/01/2015   K 3.7 07/01/2015   CL 102 07/01/2015   CO2 26 07/01/2015   GLUCOSE 127* 07/01/2015   BUN 19 07/01/2015   CREATININE 0.79 07/01/2015   CALCIUM 8.9 07/01/2015   PROT 6.7 07/01/2015   ALBUMIN 3.8 07/01/2015   AST 27 07/01/2015   ALT 23 07/01/2015   ALKPHOS 81 07/01/2015   BILITOT 0.5 07/01/2015   GFRNONAA >60 07/01/2015   GFRAA >60 07/01/2015    Lab Results  Component Value Date   WBC 7.3 07/01/2015   NEUTROABS 4.9 07/01/2015   HGB 11.7* 07/01/2015   HCT 34.8* 07/01/2015   MCV 93.2 07/01/2015   PLT 316 07/01/2015     STUDIES: Nm Cardiac Muga Rest  06/09/2015   CLINICAL DATA:  Pre chemotherapy.  Breast cancer.  EXAM: NUCLEAR MEDICINE CARDIAC BLOOD POOL IMAGING (MUGA)  TECHNIQUE: Cardiac multi-gated acquisition was performed at rest following intravenous injection of Tc-79mlabeled red blood cells.  RADIOPHARMACEUTICALS:  22.978 mCi Tc-922mDP in-vitro labeled red blood  cells IV  COMPARISON:  None.  FINDINGS: The left ventricular ejection fraction was calculated equal 62.5%. Normal left ventricular wall motion.  IMPRESSION: 1. Left ventricular ejection fraction equals 62.5%.   Electronically Signed   By: TaKerby Moors.D.   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  PLAN:    1. Breast cancer: Given patient's triple negative status of her disease, she will require adjuvant chemotherapy using dose dense Adriamycin plus Cytoxan followed by weekly Taxol. MUGA as above is adequate to proceed with treatment. Proceed with cycle 2 of 4 of Adriamycin and Cytoxan. She will also require OnPro neulasta  with her treatments. At the conclusion of her chemotherapy, patient will require adjuvant XRT. An aromatase inhibitor would not offer benefit given the triple negative status of her disease. Return to clinic in 3 weeks rather than 2 for consideration of cycle 3 secondary to a preplanned trip to help  her son move to [employer]. 2. Anxiety: Monitor.   Patient expressed understanding and was in agreement with this plan. She also understands that She can call clinic at any time with any questions, concerns, or complaints.   Breast cancer   Staging form: Breast, AJCC 7th Edition     Clinical stage from 06/04/2015: Stage IA (T1c, N0, M0) - Signed by Lloyd Huger, MD on 06/04/2015   Lloyd Huger, MD   07/07/2015 3:00 PM

## 2015-07-22 ENCOUNTER — Inpatient Hospital Stay: Payer: No Typology Code available for payment source

## 2015-07-22 ENCOUNTER — Inpatient Hospital Stay (HOSPITAL_BASED_OUTPATIENT_CLINIC_OR_DEPARTMENT_OTHER): Payer: No Typology Code available for payment source | Admitting: Oncology

## 2015-07-22 ENCOUNTER — Inpatient Hospital Stay: Payer: No Typology Code available for payment source | Attending: Oncology

## 2015-07-22 DIAGNOSIS — C50912 Malignant neoplasm of unspecified site of left female breast: Secondary | ICD-10-CM | POA: Diagnosis not present

## 2015-07-22 DIAGNOSIS — Z9012 Acquired absence of left breast and nipple: Secondary | ICD-10-CM | POA: Insufficient documentation

## 2015-07-22 DIAGNOSIS — C50919 Malignant neoplasm of unspecified site of unspecified female breast: Secondary | ICD-10-CM

## 2015-07-22 DIAGNOSIS — Z79899 Other long term (current) drug therapy: Secondary | ICD-10-CM

## 2015-07-22 DIAGNOSIS — Z809 Family history of malignant neoplasm, unspecified: Secondary | ICD-10-CM | POA: Insufficient documentation

## 2015-07-22 DIAGNOSIS — F419 Anxiety disorder, unspecified: Secondary | ICD-10-CM

## 2015-07-22 DIAGNOSIS — Z171 Estrogen receptor negative status [ER-]: Secondary | ICD-10-CM | POA: Diagnosis not present

## 2015-07-22 DIAGNOSIS — Z5111 Encounter for antineoplastic chemotherapy: Secondary | ICD-10-CM | POA: Insufficient documentation

## 2015-07-22 DIAGNOSIS — I10 Essential (primary) hypertension: Secondary | ICD-10-CM | POA: Insufficient documentation

## 2015-07-22 LAB — COMPREHENSIVE METABOLIC PANEL
ALK PHOS: 74 U/L (ref 38–126)
ALT: 15 U/L (ref 14–54)
ANION GAP: 8 (ref 5–15)
AST: 21 U/L (ref 15–41)
Albumin: 3.7 g/dL (ref 3.5–5.0)
BUN: 20 mg/dL (ref 6–20)
CO2: 28 mmol/L (ref 22–32)
CREATININE: 0.63 mg/dL (ref 0.44–1.00)
Calcium: 8.9 mg/dL (ref 8.9–10.3)
Chloride: 102 mmol/L (ref 101–111)
GFR calc Af Amer: >60 mL/min (ref >60)
Glucose, Bld: 123 mg/dL — ABNORMAL HIGH (ref 65–99)
Potassium: 3.8 mmol/L (ref 3.5–5.1)
Sodium: 138 mmol/L (ref 135–145)
Total Bilirubin: 0.4 mg/dL (ref 0.3–1.2)
Total Protein: 6.5 g/dL (ref 6.5–8.1)

## 2015-07-22 LAB — CBC WITH DIFFERENTIAL/PLATELET
Basophils Absolute: 0.1 10*3/uL (ref 0–0.1)
Basophils Relative: 2 %
EOS ABS: 0.1 10*3/uL (ref 0–0.7)
EOS PCT: 1 %
HCT: 32.7 % — ABNORMAL LOW (ref 35.0–47.0)
HEMOGLOBIN: 11 g/dL — AB (ref 12.0–16.0)
LYMPHS ABS: 0.8 10*3/uL — AB (ref 1.0–3.6)
Lymphocytes Relative: 14 %
MCH: 31.6 pg (ref 26.0–34.0)
MCHC: 33.7 g/dL (ref 32.0–36.0)
MCV: 93.7 fL (ref 80.0–100.0)
Monocytes Absolute: 0.7 10*3/uL (ref 0.2–0.9)
Monocytes Relative: 12 %
Neutro Abs: 4.3 10*3/uL (ref 1.4–6.5)
Neutrophils Relative %: 71 %
PLATELETS: 272 10*3/uL (ref 150–440)
RBC: 3.5 MIL/uL — AB (ref 3.80–5.20)
RDW: 14.7 % — ABNORMAL HIGH (ref 11.5–14.5)
WBC: 6.1 10*3/uL (ref 3.6–11.0)

## 2015-07-22 MED ORDER — SODIUM CHLORIDE 0.9 % IV SOLN
600.0000 mg/m2 | Freq: Once | INTRAVENOUS | Status: AC
  Administered 2015-07-22: 1320 mg via INTRAVENOUS
  Filled 2015-07-22: qty 66

## 2015-07-22 MED ORDER — DOXORUBICIN HCL CHEMO IV INJECTION 2 MG/ML
60.0000 mg/m2 | Freq: Once | INTRAVENOUS | Status: AC
  Administered 2015-07-22: 132 mg via INTRAVENOUS
  Filled 2015-07-22: qty 66

## 2015-07-22 MED ORDER — PALONOSETRON HCL INJECTION 0.25 MG/5ML
0.2500 mg | Freq: Once | INTRAVENOUS | Status: AC
  Administered 2015-07-22: 0.25 mg via INTRAVENOUS
  Filled 2015-07-22: qty 5

## 2015-07-22 MED ORDER — PEGFILGRASTIM 6 MG/0.6ML ~~LOC~~ PSKT
6.0000 mg | PREFILLED_SYRINGE | Freq: Once | SUBCUTANEOUS | Status: AC
  Administered 2015-07-22: 6 mg via SUBCUTANEOUS
  Filled 2015-07-22: qty 0.6

## 2015-07-22 MED ORDER — SODIUM CHLORIDE 0.9 % IV SOLN
Freq: Once | INTRAVENOUS | Status: AC
  Administered 2015-07-22: 11:00:00 via INTRAVENOUS
  Filled 2015-07-22: qty 5

## 2015-07-22 MED ORDER — HEPARIN SOD (PORK) LOCK FLUSH 100 UNIT/ML IV SOLN
500.0000 [IU] | Freq: Once | INTRAVENOUS | Status: AC | PRN
  Administered 2015-07-22: 500 [IU]

## 2015-07-22 MED ORDER — SODIUM CHLORIDE 0.9 % IJ SOLN
10.0000 mL | INTRAMUSCULAR | Status: DC | PRN
  Administered 2015-07-22: 10 mL
  Filled 2015-07-22: qty 10

## 2015-07-22 MED ORDER — HEPARIN SOD (PORK) LOCK FLUSH 100 UNIT/ML IV SOLN: 500.0000 [IU] | Freq: Once | INTRAVENOUS | Status: AC

## 2015-07-22 MED ORDER — SODIUM CHLORIDE 0.9 % IV SOLN
Freq: Once | INTRAVENOUS | Status: AC
  Administered 2015-07-22: 11:00:00 via INTRAVENOUS
  Filled 2015-07-22: qty 1000

## 2015-07-22 MED ORDER — SODIUM CHLORIDE 0.9 % IJ SOLN
10.0000 mL | INTRAMUSCULAR | Status: AC | PRN
  Administered 2015-07-22: 10 mL
  Filled 2015-07-22: qty 10

## 2015-07-22 NOTE — Progress Notes (Signed)
Patient has nausea but had not taken any medications because she was not sure if able to tolerate at work.  Advised her to try taking them at home to see if she tolerates meds before taking at work.

## 2015-07-24 NOTE — Progress Notes (Signed)
[place]  Telephone:(336) [phone] Fax:(336) [phone]  ID: [name] OB: 09/03/1961  MR#: [id number]  QJF#:[id number]  Patient Care Team: [name], NP as PCP - General (Nurse Practitioner) [name], NP (Nurse Practitioner) Florene Glen, MD (Surgery)  CHIEF COMPLAINT:  Chief Complaint  Patient presents with  . Follow-up    breast cancer    INTERVAL HISTORY: Patient returns to clinic today for further evaluation and consideration of cycle 3 of 4 of Adriamycin and Cytoxan. She currently feels well. She helped move her son into college last week without significant problems. She has no neurologic complaints. She denies any recent fevers or illnesses. She has a good appetite and denies weight loss. She denies any pain. She has no chest pain or shortness of breath. She denies any nausea, vomiting, constipation, or diarrhea. She has no urinary complaints. Patient offers no specific complaints today.  REVIEW OF SYSTEMS:   Review of Systems  Constitutional: Negative.   Psychiatric/Behavioral: The patient is nervous/anxious.     As per HPI. Otherwise, a complete review of systems is negatve.  PAST MEDICAL HISTORY: Past Medical History  Diagnosis Date  . Breast cancer   . Seasonal allergies   . Frequent sinus infections   . Hypertension     PAST SURGICAL HISTORY: Past Surgical History  Procedure Laterality Date  . Breast biopsy Left 04/27/2015    results unknown  . Mastectomy, partial Left 05/19/2015    Procedure: MASTECTOMY PARTIAL;  Surgeon: Florene Glen, MD;  Location: [facility] ORS;  Service: General;  Laterality: Left;  . Breast lumpectomy with needle localization Left 05/19/2015    Procedure: BREAST LUMPECTOMY WITH NEEDLE LOCALIZATION;  Surgeon: Florene Glen, MD;  Location: [facility] OR[facility];  Service: General;  Laterality: Left;  . Breast lumpectomy with sentinel lymph node biopsy Left 05/19/2015    Procedure: BREAST LUMPECTOMY WITH SENTINEL  LYMPH NODE BX;  Surgeon: Florene Glen, MD;  Location: [facility] OR[facility];  Service: General;  Laterality: Left;  . Portacath placement Left 05/19/2015    Procedure: INSERTION PORT-A-CATH;  Surgeon: Florene Glen, MD;  Location: [facility] OR[facility];  Service: General;  Laterality: Left;    FAMILY HISTORY Family History  Problem Relation Age of Onset  . Cancer Father   . Hypertension Father   . CAD Father   . Heart attack Father        ADVANCED DIRECTIVES:    HEALTH MAINTENANCE: Social History  Substance Use Topics  . Smoking status: Never Smoker   . Smokeless tobacco: Never Used  . Alcohol Use: Yes     Comment: rare beer     Colonoscopy:  PAP:  Bone density:  Lipid panel:  No Known Allergies  Current Outpatient Prescriptions  Medication Sig Dispense Refill  . acetaminophen (TYLENOL) 500 MG tablet Take 1,000 mg by mouth every 6 (six) hours as needed for mild pain or headache.    . B Complex Vitamins (VITAMIN B COMPLEX PO) Take 1 tablet by mouth daily as needed.    . Calcium Carbonate-Vitamin D (CALCIUM-VITAMIN D) 500-200 MG-UNIT per tablet Take 1 tablet by mouth daily.    . chlorpheniramine-HYDROcodone (TUSSIONEX) 10-8 MG/5ML SUER Take 5 mLs by mouth every 12 (twelve) hours as needed for cough. 140 mL 0  . diphenhydramine-acetaminophen (TYLENOL PM) 25-500 MG TABS Take 1-2 tablets by mouth at bedtime as needed.    . fluticasone (FLONASE) 50 MCG/ACT nasal spray Place into the nose.    Marland Kitchen Hydrocodone-Acetaminophen (VICODIN) 5-300  MG TABS Take 1 tablet by mouth every 4 (four) hours as needed. 30 each 0  . lidocaine-prilocaine (EMLA) cream Apply 1 application topically as needed. Apply to port and cover with saran wrap 1-2 hours before chemotherapy 30 g 2  . loratadine (CLARITIN) 10 MG tablet Take by mouth.    . ondansetron (ZOFRAN-ODT) 4 MG disintegrating tablet Take 1 tablet (4 mg total) by mouth every 8 (eight) hours as needed for nausea or vomiting. 20 tablet 0  . polyethylene glycol  powder (GLYCOLAX/MIRALAX) powder Take 1 Container by mouth once.    . prochlorperazine (COMPAZINE) 10 MG tablet Take 1 tablet (10 mg total) by mouth every 6 (six) hours as needed for nausea or vomiting. 60 tablet 0  . senna (SENOKOT) 8.6 MG TABS tablet Take 1 tablet by mouth.     No current facility-administered medications for this visit.   Facility-Administered Medications Ordered in Other Visits  Medication Dose Route Frequency Provider Last Rate Last Dose  . heparin lock flush 100 unit/mL  500 Units Intravenous Once Lloyd Huger, MD      . sodium chloride 0.9 % injection 10 mL  10 mL Intracatheter PRN Lloyd Huger, MD   10 mL at 07/22/15 0956    OBJECTIVE: There were no vitals filed for this visit.   There is no weight on file to calculate BMI.    ECOG FS:0 - Asymptomatic  General: Well-developed, well-nourished, no acute distress. Eyes: anicteric sclera. Breasts: Patient requested exam be deferred today. Lungs: Clear to auscultation bilaterally. Heart: Regular rate and rhythm. No rubs, murmurs, or gallops. Abdomen: Soft, nontender, nondistended. No organomegaly noted, normoactive bowel sounds. Musculoskeletal: No edema, cyanosis, or clubbing. Neuro: Alert, answering all questions appropriately. Cranial nerves grossly intact. Skin: No rashes or petechiae noted. Psych: Normal affect.   LAB RESULTS:  Lab Results  Component Value Date   NA 138 07/22/2015   K 3.8 07/22/2015   CL 102 07/22/2015   CO2 28 07/22/2015   GLUCOSE 123* 07/22/2015   BUN 20 07/22/2015   CREATININE 0.63 07/22/2015   CALCIUM 8.9 07/22/2015   PROT 6.5 07/22/2015   ALBUMIN 3.7 07/22/2015   AST 21 07/22/2015   ALT 15 07/22/2015   ALKPHOS 74 07/22/2015   BILITOT 0.4 07/22/2015   GFRNONAA >60 07/22/2015   GFRAA >60 07/22/2015    Lab Results  Component Value Date   WBC 6.1 07/22/2015   NEUTROABS 4.3 07/22/2015   HGB 11.0* 07/22/2015   HCT 32.7* 07/22/2015   MCV 93.7 07/22/2015   PLT  272 07/22/2015     STUDIES: No results found.  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  PLAN:    1. Breast cancer: Given patient's triple negative status of her disease, she will require adjuvant chemotherapy using dose dense Adriamycin plus Cytoxan followed by weekly Taxol. MUGA as above is adequate to proceed with treatment. Proceed with cycle 3 of 4 of Adriamycin and Cytoxan. She will also require OnPro neulasta with her treatments. At the conclusion of her chemotherapy, patient will require adjuvant XRT. An aromatase inhibitor would not offer benefit given the triple negative status of her disease. Return to clinic in 2 weeks for consideration of cycle 4.  Patient will also require 12 weekly cycles Taxol at the conclusion of her AC. 2. Anxiety: Monitor.   Patient expressed understanding and was in agreement with this plan. She also understands that She can call  clinic at any time with any questions, concerns, or complaints.   Breast cancer   Staging form: Breast, AJCC 7th Edition     Clinical stage from 06/04/2015: Stage IA (T1c, N0, M0) - Signed by Lloyd Huger, MD on 06/04/2015   Lloyd Huger, MD   07/24/2015 12:04 PM

## 2015-08-05 ENCOUNTER — Inpatient Hospital Stay: Payer: No Typology Code available for payment source

## 2015-08-05 ENCOUNTER — Inpatient Hospital Stay: Payer: No Typology Code available for payment source | Attending: Oncology

## 2015-08-05 ENCOUNTER — Inpatient Hospital Stay (HOSPITAL_BASED_OUTPATIENT_CLINIC_OR_DEPARTMENT_OTHER): Payer: No Typology Code available for payment source | Admitting: Oncology

## 2015-08-05 DIAGNOSIS — I10 Essential (primary) hypertension: Secondary | ICD-10-CM | POA: Insufficient documentation

## 2015-08-05 DIAGNOSIS — D6481 Anemia due to antineoplastic chemotherapy: Secondary | ICD-10-CM

## 2015-08-05 DIAGNOSIS — C50912 Malignant neoplasm of unspecified site of left female breast: Secondary | ICD-10-CM

## 2015-08-05 DIAGNOSIS — Z79899 Other long term (current) drug therapy: Secondary | ICD-10-CM | POA: Diagnosis not present

## 2015-08-05 DIAGNOSIS — F419 Anxiety disorder, unspecified: Secondary | ICD-10-CM | POA: Insufficient documentation

## 2015-08-05 DIAGNOSIS — Z809 Family history of malignant neoplasm, unspecified: Secondary | ICD-10-CM

## 2015-08-05 DIAGNOSIS — K140 Glossitis: Secondary | ICD-10-CM | POA: Diagnosis not present

## 2015-08-05 DIAGNOSIS — Z9012 Acquired absence of left breast and nipple: Secondary | ICD-10-CM

## 2015-08-05 DIAGNOSIS — Z171 Estrogen receptor negative status [ER-]: Secondary | ICD-10-CM | POA: Diagnosis not present

## 2015-08-05 DIAGNOSIS — C50919 Malignant neoplasm of unspecified site of unspecified female breast: Secondary | ICD-10-CM

## 2015-08-05 DIAGNOSIS — Z5111 Encounter for antineoplastic chemotherapy: Secondary | ICD-10-CM | POA: Diagnosis not present

## 2015-08-05 LAB — COMPREHENSIVE METABOLIC PANEL
ALBUMIN: 3.9 g/dL (ref 3.5–5.0)
ALK PHOS: 106 U/L (ref 38–126)
ALT: 16 U/L (ref 14–54)
ANION GAP: 9 (ref 5–15)
AST: 25 U/L (ref 15–41)
BUN: 21 mg/dL — ABNORMAL HIGH (ref 6–20)
CO2: 28 mmol/L (ref 22–32)
Calcium: 9 mg/dL (ref 8.9–10.3)
Chloride: 101 mmol/L (ref 101–111)
Creatinine, Ser: 0.77 mg/dL (ref 0.44–1.00)
GFR calc Af Amer: >60 mL/min (ref >60)
GFR calc non Af Amer: >60 mL/min (ref >60)
GLUCOSE: 133 mg/dL — AB (ref 65–99)
POTASSIUM: 3.6 mmol/L (ref 3.5–5.1)
SODIUM: 138 mmol/L (ref 135–145)
Total Bilirubin: 0.5 mg/dL (ref 0.3–1.2)
Total Protein: 6.9 g/dL (ref 6.5–8.1)

## 2015-08-05 LAB — CBC WITH DIFFERENTIAL/PLATELET
Basophils Absolute: 0.1 10*3/uL (ref 0–0.1)
EOS ABS: 0.3 10*3/uL (ref 0–0.7)
Eosinophils Relative: 3 %
HCT: 32.4 % — ABNORMAL LOW (ref 35.0–47.0)
HEMOGLOBIN: 11.1 g/dL — AB (ref 12.0–16.0)
Lymphocytes Relative: 14 %
Lymphs Abs: 1.3 10*3/uL (ref 1.0–3.6)
MCH: 32.3 pg (ref 26.0–34.0)
MCHC: 34.1 g/dL (ref 32.0–36.0)
MCV: 94.7 fL (ref 80.0–100.0)
Monocytes Absolute: 1 10*3/uL — ABNORMAL HIGH (ref 0.2–0.9)
Neutro Abs: 6.8 10*3/uL — ABNORMAL HIGH (ref 1.4–6.5)
Platelets: 189 10*3/uL (ref 150–440)
RBC: 3.42 MIL/uL — ABNORMAL LOW (ref 3.80–5.20)
RDW: 15.2 % — AB (ref 11.5–14.5)
WBC: 9.5 10*3/uL (ref 3.6–11.0)

## 2015-08-05 MED ORDER — SODIUM CHLORIDE 0.9 % IV SOLN
Freq: Once | INTRAVENOUS | Status: AC
  Administered 2015-08-05: 10:00:00 via INTRAVENOUS
  Filled 2015-08-05: qty 5

## 2015-08-05 MED ORDER — HEPARIN SOD (PORK) LOCK FLUSH 100 UNIT/ML IV SOLN
500.0000 [IU] | Freq: Once | INTRAVENOUS | Status: AC | PRN
  Filled 2015-08-05 (×2): qty 5

## 2015-08-05 MED ORDER — PEGFILGRASTIM 6 MG/0.6ML ~~LOC~~ PSKT
6.0000 mg | PREFILLED_SYRINGE | Freq: Once | SUBCUTANEOUS | Status: AC
  Administered 2015-08-05: 6 mg via SUBCUTANEOUS
  Filled 2015-08-05: qty 0.6

## 2015-08-05 MED ORDER — SODIUM CHLORIDE 0.9 % IV SOLN
Freq: Once | INTRAVENOUS | Status: AC
  Administered 2015-08-05: 10:00:00 via INTRAVENOUS
  Filled 2015-08-05: qty 1000

## 2015-08-05 MED ORDER — PALONOSETRON HCL INJECTION 0.25 MG/5ML
0.2500 mg | Freq: Once | INTRAVENOUS | Status: AC
Start: 2015-08-05 — End: 2015-08-05
  Administered 2015-08-05: 0.25 mg via INTRAVENOUS
  Filled 2015-08-05: qty 5

## 2015-08-05 MED ORDER — SODIUM CHLORIDE 0.9 % IV SOLN
600.0000 mg/m2 | Freq: Once | INTRAVENOUS | Status: AC
  Administered 2015-08-05: 1320 mg via INTRAVENOUS
  Filled 2015-08-05: qty 66

## 2015-08-05 MED ORDER — DOXORUBICIN HCL CHEMO IV INJECTION 2 MG/ML
60.0000 mg/m2 | Freq: Once | INTRAVENOUS | Status: AC
  Administered 2015-08-05: 132 mg via INTRAVENOUS
  Filled 2015-08-05: qty 66

## 2015-08-05 MED ORDER — SODIUM CHLORIDE 0.9 % IJ SOLN
10.0000 mL | INTRAMUSCULAR | Status: DC | PRN
  Administered 2015-08-05: 10 mL
  Filled 2015-08-05: qty 10

## 2015-08-08 NOTE — Progress Notes (Signed)
[place]  Telephone:(336) [phone] Fax:(336) [phone]  ID: [name] OB: 21-Jan-1961  MR#: [id number]  YME#:[id number]  Patient Care Team: [name], NP as PCP - General (Nurse Practitioner) [name], NP (Nurse Practitioner) Florene Glen, MD (Surgery)  CHIEF COMPLAINT:  No chief complaint on file.   INTERVAL HISTORY: Patient returns to clinic today for further evaluation and consideration of cycle 4 of 4 of Adriamycin and Cytoxan. She currently feels well. She has no neurologic complaints. She denies any recent fevers or illnesses. She has a good appetite and denies weight loss. She denies any pain. She has no chest pain or shortness of breath. She denies any nausea, vomiting, constipation, or diarrhea. She has no urinary complaints. Patient offers no specific complaints today.  REVIEW OF SYSTEMS:   Review of Systems  Constitutional: Negative.   Psychiatric/Behavioral: The patient is nervous/anxious.     As per HPI. Otherwise, a complete review of systems is negatve.  PAST MEDICAL HISTORY: Past Medical History  Diagnosis Date  . Breast cancer   . Seasonal allergies   . Frequent sinus infections   . Hypertension     PAST SURGICAL HISTORY: Past Surgical History  Procedure Laterality Date  . Breast biopsy Left 04/27/2015    results unknown  . Mastectomy, partial Left 05/19/2015    Procedure: MASTECTOMY PARTIAL;  Surgeon: Florene Glen, MD;  Location: [facility];  Service: General;  Laterality: Left;  . Breast lumpectomy with needle localization Left 05/19/2015    Procedure: BREAST LUMPECTOMY WITH NEEDLE LOCALIZATION;  Surgeon: Florene Glen, MD;  Location: [facility];  Service: General;  Laterality: Left;  . Breast lumpectomy with sentinel lymph node biopsy Left 05/19/2015    Procedure: BREAST LUMPECTOMY WITH SENTINEL LYMPH NODE BX;  Surgeon: Florene Glen, MD;  Location: [facility];  Service: General;  Laterality: Left;  . Portacath  placement Left 05/19/2015    Procedure: INSERTION PORT-A-CATH;  Surgeon: Florene Glen, MD;  Location: [facility];  Service: General;  Laterality: Left;    FAMILY HISTORY Family History  Problem Relation Age of Onset  . Cancer Father   . Hypertension Father   . CAD Father   . Heart attack Father        ADVANCED DIRECTIVES:    HEALTH MAINTENANCE: Social History  Substance Use Topics  . Smoking status: Never Smoker   . Smokeless tobacco: Never Used  . Alcohol Use: Yes     Comment: rare beer     Colonoscopy:  PAP:  Bone density:  Lipid panel:  No Known Allergies  Current Outpatient Prescriptions  Medication Sig Dispense Refill  . acetaminophen (TYLENOL) 500 MG tablet Take 1,000 mg by mouth every 6 (six) hours as needed for mild pain or headache.    . B Complex Vitamins (VITAMIN B COMPLEX PO) Take 1 tablet by mouth daily as needed.    . Calcium Carbonate-Vitamin D (CALCIUM-VITAMIN D) 500-200 MG-UNIT per tablet Take 1 tablet by mouth daily.    . chlorpheniramine-HYDROcodone (TUSSIONEX) 10-8 MG/5ML SUER Take 5 mLs by mouth every 12 (twelve) hours as needed for cough. 140 mL 0  . diphenhydramine-acetaminophen (TYLENOL PM) 25-500 MG TABS Take 1-2 tablets by mouth at bedtime as needed.    . fluticasone (FLONASE) 50 MCG/ACT nasal spray Place into the nose.    Marland Kitchen Hydrocodone-Acetaminophen (VICODIN) 5-300 MG TABS Take 1 tablet by mouth every 4 (four) hours as needed. 30 each 0  . lidocaine-prilocaine (EMLA) cream Apply  1 application topically as needed. Apply to port and cover with saran wrap 1-2 hours before chemotherapy 30 g 2  . loratadine (CLARITIN) 10 MG tablet Take by mouth.    . ondansetron (ZOFRAN-ODT) 4 MG disintegrating tablet Take 1 tablet (4 mg total) by mouth every 8 (eight) hours as needed for nausea or vomiting. 20 tablet 0  . polyethylene glycol powder (GLYCOLAX/MIRALAX) powder Take 1 Container by mouth once.    . prochlorperazine (COMPAZINE) 10 MG tablet Take 1  tablet (10 mg total) by mouth every 6 (six) hours as needed for nausea or vomiting. 60 tablet 0  . senna (SENOKOT) 8.6 MG TABS tablet Take 1 tablet by mouth.     No current facility-administered medications for this visit.   Facility-Administered Medications Ordered in Other Visits  Medication Dose Route Frequency Provider Last Rate Last Dose  . heparin lock flush 100 unit/mL  500 Units Intravenous Once Lloyd Huger, MD      . sodium chloride 0.9 % injection 10 mL  10 mL Intracatheter PRN Lloyd Huger, MD   10 mL at 07/22/15 0956  . sodium chloride 0.9 % injection 10 mL  10 mL Intracatheter PRN Lloyd Huger, MD   10 mL at 08/05/15 0840    OBJECTIVE: There were no vitals filed for this visit.   There is no weight on file to calculate BMI.    ECOG FS:0 - Asymptomatic  General: Well-developed, well-nourished, no acute distress. Eyes: anicteric sclera. Breasts: Patient requested exam be deferred today. Lungs: Clear to auscultation bilaterally. Heart: Regular rate and rhythm. No rubs, murmurs, or gallops. Abdomen: Soft, nontender, nondistended. No organomegaly noted, normoactive bowel sounds. Musculoskeletal: No edema, cyanosis, or clubbing. Neuro: Alert, answering all questions appropriately. Cranial nerves grossly intact. Skin: No rashes or petechiae noted. Psych: Normal affect.   LAB RESULTS:  Lab Results  Component Value Date   NA 138 08/05/2015   K 3.6 08/05/2015   CL 101 08/05/2015   CO2 28 08/05/2015   GLUCOSE 133* 08/05/2015   BUN 21* 08/05/2015   CREATININE 0.77 08/05/2015   CALCIUM 9.0 08/05/2015   PROT 6.9 08/05/2015   ALBUMIN 3.9 08/05/2015   AST 25 08/05/2015   ALT 16 08/05/2015   ALKPHOS 106 08/05/2015   BILITOT 0.5 08/05/2015   GFRNONAA >60 08/05/2015   GFRAA >60 08/05/2015    Lab Results  Component Value Date   WBC 9.5 08/05/2015   NEUTROABS 6.8* 08/05/2015   HGB 11.1* 08/05/2015   HCT 32.4* 08/05/2015   MCV 94.7 08/05/2015   PLT  189 08/05/2015     STUDIES: No results found.  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  PLAN:    1. Breast cancer: Given patient's triple negative status of her disease, she will require adjuvant chemotherapy using dose dense Adriamycin plus Cytoxan followed by weekly Taxol. MUGA is adequate to proceed with treatment. Proceed with cycle 4 of 4 of Adriamycin and Cytoxan. She will also require OnPro neulasta with her treatments. At the conclusion of her chemotherapy, patient will require adjuvant XRT. An aromatase inhibitor would not offer benefit given the triple negative status of her disease. Return to clinic in 2 weeks for consideration of cycle 1 of 12 of weekly Taxol.  2. Anxiety: Monitor. 3. Anemia: Secondary to chemotherapy, monitor.   Patient expressed understanding and was in agreement with this plan. She also understands that She can call clinic at  any time with any questions, concerns, or complaints.   Breast cancer   Staging form: Breast, AJCC 7th Edition     Clinical stage from 06/04/2015: Stage IA (T1c, N0, M0) - Signed by Lloyd Huger, MD on 06/04/2015   Lloyd Huger, MD   08/08/2015 1:09 PM

## 2015-08-11 ENCOUNTER — Telehealth: Payer: Self-pay | Admitting: *Deleted

## 2015-08-11 ENCOUNTER — Other Ambulatory Visit: Payer: Self-pay | Admitting: Oncology

## 2015-08-11 MED ORDER — MAGIC MOUTHWASH: 5.0000 mL | Freq: Four times a day (QID) | ORAL | Status: DC | PRN

## 2015-08-11 NOTE — Telephone Encounter (Signed)
Patient called with complaints of mouth soreness.  States her tongue and mouth are both sore and she can hardly eat.  States last chemo was last week.  States she would like to try a mouthwash.  Notified Duwayne Heck, CMA to request mouthwash prescription from Dr. Grayland Ormond to be called in to [facility].

## 2015-08-19 ENCOUNTER — Inpatient Hospital Stay: Payer: No Typology Code available for payment source

## 2015-08-19 ENCOUNTER — Inpatient Hospital Stay (HOSPITAL_BASED_OUTPATIENT_CLINIC_OR_DEPARTMENT_OTHER): Payer: No Typology Code available for payment source | Admitting: Oncology

## 2015-08-19 DIAGNOSIS — Z809 Family history of malignant neoplasm, unspecified: Secondary | ICD-10-CM

## 2015-08-19 DIAGNOSIS — C50912 Malignant neoplasm of unspecified site of left female breast: Secondary | ICD-10-CM

## 2015-08-19 DIAGNOSIS — I10 Essential (primary) hypertension: Secondary | ICD-10-CM

## 2015-08-19 DIAGNOSIS — F419 Anxiety disorder, unspecified: Secondary | ICD-10-CM | POA: Diagnosis not present

## 2015-08-19 DIAGNOSIS — D6481 Anemia due to antineoplastic chemotherapy: Secondary | ICD-10-CM | POA: Diagnosis not present

## 2015-08-19 DIAGNOSIS — C50919 Malignant neoplasm of unspecified site of unspecified female breast: Secondary | ICD-10-CM

## 2015-08-19 DIAGNOSIS — Z171 Estrogen receptor negative status [ER-]: Secondary | ICD-10-CM | POA: Diagnosis not present

## 2015-08-19 DIAGNOSIS — Z9012 Acquired absence of left breast and nipple: Secondary | ICD-10-CM

## 2015-08-19 DIAGNOSIS — Z79899 Other long term (current) drug therapy: Secondary | ICD-10-CM

## 2015-08-19 LAB — CBC WITH DIFFERENTIAL/PLATELET
Basophils Absolute: 0.1 10*3/uL (ref 0–0.1)
EOS ABS: 0 10*3/uL (ref 0–0.7)
HCT: 30.6 % — ABNORMAL LOW (ref 35.0–47.0)
HEMOGLOBIN: 10.3 g/dL — AB (ref 12.0–16.0)
LYMPHS ABS: 0.9 10*3/uL — AB (ref 1.0–3.6)
Lymphocytes Relative: 6 %
MCH: 32.3 pg (ref 26.0–34.0)
MCHC: 33.6 g/dL (ref 32.0–36.0)
MCV: 96.2 fL (ref 80.0–100.0)
Monocytes Absolute: 1.4 10*3/uL — ABNORMAL HIGH (ref 0.2–0.9)
Neutro Abs: 12.5 10*3/uL — ABNORMAL HIGH (ref 1.4–6.5)
PLATELETS: 248 10*3/uL (ref 150–440)
RBC: 3.18 MIL/uL — AB (ref 3.80–5.20)
RDW: 16.4 % — ABNORMAL HIGH (ref 11.5–14.5)
WBC: 14.9 10*3/uL — AB (ref 3.6–11.0)

## 2015-08-19 LAB — COMPREHENSIVE METABOLIC PANEL
ALBUMIN: 3.6 g/dL (ref 3.5–5.0)
ALK PHOS: 116 U/L (ref 38–126)
ALT: 15 U/L (ref 14–54)
ANION GAP: 9 (ref 5–15)
AST: 19 U/L (ref 15–41)
BUN: 24 mg/dL — ABNORMAL HIGH (ref 6–20)
CHLORIDE: 100 mmol/L — AB (ref 101–111)
CO2: 28 mmol/L (ref 22–32)
Calcium: 8.8 mg/dL — ABNORMAL LOW (ref 8.9–10.3)
Creatinine, Ser: 0.72 mg/dL (ref 0.44–1.00)
GFR calc non Af Amer: >60 mL/min (ref >60)
GLUCOSE: 104 mg/dL — AB (ref 65–99)
Potassium: 3.9 mmol/L (ref 3.5–5.1)
SODIUM: 137 mmol/L (ref 135–145)
Total Bilirubin: 0.3 mg/dL (ref 0.3–1.2)
Total Protein: 6.4 g/dL — ABNORMAL LOW (ref 6.5–8.1)

## 2015-08-19 MED ORDER — SODIUM CHLORIDE 0.9 % IV SOLN
Freq: Once | INTRAVENOUS | Status: AC
  Administered 2015-08-19: 10:00:00 via INTRAVENOUS
  Filled 2015-08-19: qty 4

## 2015-08-19 MED ORDER — DIPHENHYDRAMINE HCL 50 MG/ML IJ SOLN
50.0000 mg | Freq: Once | INTRAMUSCULAR | Status: AC
  Administered 2015-08-19: 50 mg via INTRAVENOUS
  Filled 2015-08-19: qty 1

## 2015-08-19 MED ORDER — SODIUM CHLORIDE 0.9 % IV SOLN
Freq: Once | INTRAVENOUS | Status: AC
  Administered 2015-08-19: 10:00:00 via INTRAVENOUS
  Filled 2015-08-19: qty 1000

## 2015-08-19 MED ORDER — HEPARIN SOD (PORK) LOCK FLUSH 100 UNIT/ML IV SOLN
500.0000 [IU] | Freq: Once | INTRAVENOUS | Status: AC
  Administered 2015-08-19: 500 [IU] via INTRAVENOUS
  Filled 2015-08-19: qty 5

## 2015-08-19 MED ORDER — FAMOTIDINE IN NACL 20-0.9 MG/50ML-% IV SOLN
20.0000 mg | Freq: Once | INTRAVENOUS | Status: AC
  Administered 2015-08-19: 20 mg via INTRAVENOUS
  Filled 2015-08-19: qty 50

## 2015-08-19 MED ORDER — SODIUM CHLORIDE 0.9 % IJ SOLN
10.0000 mL | INTRAMUSCULAR | Status: DC | PRN
  Administered 2015-08-19: 10 mL via INTRAVENOUS
  Filled 2015-08-19: qty 10

## 2015-08-19 MED ORDER — SODIUM CHLORIDE 0.9 % IV SOLN
80.0000 mg/m2 | Freq: Once | INTRAVENOUS | Status: AC
  Administered 2015-08-19: 138 mg via INTRAVENOUS
  Filled 2015-08-19: qty 23

## 2015-08-26 ENCOUNTER — Inpatient Hospital Stay: Payer: No Typology Code available for payment source

## 2015-08-26 ENCOUNTER — Inpatient Hospital Stay (HOSPITAL_BASED_OUTPATIENT_CLINIC_OR_DEPARTMENT_OTHER): Payer: No Typology Code available for payment source | Admitting: Oncology

## 2015-08-26 VITALS — BP 120/76 | HR 93 | Temp 96.3°F | Resp 20

## 2015-08-26 DIAGNOSIS — Z809 Family history of malignant neoplasm, unspecified: Secondary | ICD-10-CM

## 2015-08-26 DIAGNOSIS — F419 Anxiety disorder, unspecified: Secondary | ICD-10-CM | POA: Diagnosis not present

## 2015-08-26 DIAGNOSIS — I10 Essential (primary) hypertension: Secondary | ICD-10-CM

## 2015-08-26 DIAGNOSIS — D6481 Anemia due to antineoplastic chemotherapy: Secondary | ICD-10-CM | POA: Diagnosis not present

## 2015-08-26 DIAGNOSIS — Z171 Estrogen receptor negative status [ER-]: Secondary | ICD-10-CM

## 2015-08-26 DIAGNOSIS — C50912 Malignant neoplasm of unspecified site of left female breast: Secondary | ICD-10-CM | POA: Diagnosis not present

## 2015-08-26 DIAGNOSIS — Z9012 Acquired absence of left breast and nipple: Secondary | ICD-10-CM

## 2015-08-26 DIAGNOSIS — Z79899 Other long term (current) drug therapy: Secondary | ICD-10-CM

## 2015-08-26 DIAGNOSIS — K140 Glossitis: Secondary | ICD-10-CM

## 2015-08-26 LAB — COMPREHENSIVE METABOLIC PANEL
ALK PHOS: 84 U/L (ref 38–126)
ALT: 18 U/L (ref 14–54)
ANION GAP: 8 (ref 5–15)
AST: 20 U/L (ref 15–41)
Albumin: 3.5 g/dL (ref 3.5–5.0)
BILIRUBIN TOTAL: 0.4 mg/dL (ref 0.3–1.2)
BUN: 22 mg/dL — ABNORMAL HIGH (ref 6–20)
CALCIUM: 8.6 mg/dL — AB (ref 8.9–10.3)
CO2: 26 mmol/L (ref 22–32)
Chloride: 103 mmol/L (ref 101–111)
Creatinine, Ser: 0.72 mg/dL (ref 0.44–1.00)
GFR calc non Af Amer: >60 mL/min (ref >60)
Glucose, Bld: 125 mg/dL — ABNORMAL HIGH (ref 65–99)
Potassium: 3.5 mmol/L (ref 3.5–5.1)
Sodium: 137 mmol/L (ref 135–145)
TOTAL PROTEIN: 6.3 g/dL — AB (ref 6.5–8.1)

## 2015-08-26 LAB — CBC WITH DIFFERENTIAL/PLATELET
Basophils Absolute: 0 10*3/uL (ref 0–0.1)
Basophils Relative: 1 %
EOS ABS: 0.1 10*3/uL (ref 0–0.7)
Eosinophils Relative: 1 %
HEMATOCRIT: 31.4 % — AB (ref 35.0–47.0)
HEMOGLOBIN: 10.6 g/dL — AB (ref 12.0–16.0)
LYMPHS ABS: 1 10*3/uL (ref 1.0–3.6)
Lymphocytes Relative: 13 %
MCH: 32.8 pg (ref 26.0–34.0)
MCHC: 33.7 g/dL (ref 32.0–36.0)
MCV: 97.5 fL (ref 80.0–100.0)
MONO ABS: 1 10*3/uL — AB (ref 0.2–0.9)
MONOS PCT: 13 %
NEUTROS ABS: 5.8 10*3/uL (ref 1.4–6.5)
NEUTROS PCT: 72 %
Platelets: 322 10*3/uL (ref 150–440)
RBC: 3.22 MIL/uL — ABNORMAL LOW (ref 3.80–5.20)
RDW: 17.2 % — AB (ref 11.5–14.5)
WBC: 8 10*3/uL (ref 3.6–11.0)

## 2015-08-26 MED ORDER — HEPARIN SOD (PORK) LOCK FLUSH 100 UNIT/ML IV SOLN: 500.0000 [IU] | Freq: Once | INTRAVENOUS | Status: AC | PRN

## 2015-08-26 MED ORDER — SODIUM CHLORIDE 0.9 % IV SOLN
Freq: Once | INTRAVENOUS | Status: AC
  Administered 2015-08-26: 10:00:00 via INTRAVENOUS
  Filled 2015-08-26: qty 1000

## 2015-08-26 MED ORDER — SODIUM CHLORIDE 0.9 % IJ SOLN
10.0000 mL | INTRAMUSCULAR | Status: DC | PRN
  Administered 2015-08-26: 10 mL
  Filled 2015-08-26: qty 10

## 2015-08-26 MED ORDER — SODIUM CHLORIDE 0.9 % IJ SOLN
10.0000 mL | INTRAMUSCULAR | Status: DC | PRN
  Administered 2015-08-26: 10 mL via INTRAVENOUS
  Filled 2015-08-26: qty 10

## 2015-08-26 MED ORDER — DIPHENHYDRAMINE HCL 50 MG/ML IJ SOLN
50.0000 mg | Freq: Once | INTRAMUSCULAR | Status: AC
  Administered 2015-08-26: 50 mg via INTRAVENOUS
  Filled 2015-08-26: qty 1

## 2015-08-26 MED ORDER — SODIUM CHLORIDE 0.9 % IV SOLN
Freq: Once | INTRAVENOUS | Status: AC
  Administered 2015-08-26: 10:00:00 via INTRAVENOUS
  Filled 2015-08-26: qty 4

## 2015-08-26 MED ORDER — HEPARIN SOD (PORK) LOCK FLUSH 100 UNIT/ML IV SOLN
500.0000 [IU] | Freq: Once | INTRAVENOUS | Status: AC
  Administered 2015-08-26: 500 [IU] via INTRAVENOUS
  Filled 2015-08-26: qty 5

## 2015-08-26 MED ORDER — PACLITAXEL CHEMO INJECTION 300 MG/50ML
80.0000 mg/m2 | Freq: Once | INTRAVENOUS | Status: AC
  Administered 2015-08-26: 138 mg via INTRAVENOUS
  Filled 2015-08-26: qty 23

## 2015-08-26 MED ORDER — FAMOTIDINE IN NACL 20-0.9 MG/50ML-% IV SOLN
20.0000 mg | Freq: Once | INTRAVENOUS | Status: AC
  Administered 2015-08-26: 20 mg via INTRAVENOUS
  Filled 2015-08-26: qty 50

## 2015-08-28 NOTE — Progress Notes (Signed)
[place]  Telephone:(336) [phone] Fax:(336) [phone]  ID: Brittany Ryan OB: [birth year]  MR#: [id number]  IPJ#:[id number]  Patient Care Team: Brittany Good, NP as PCP - General (Nurse Practitioner) Brittany Good, NP (Nurse Practitioner) Brittany Glen, MD (Surgery)  CHIEF COMPLAINT: Breast cancer. No chief complaint on file.   INTERVAL HISTORY: Patient returns to clinic today for further evaluation and consideration of cycle 2 of 12 of weekly Taxol. She tolerated her first treatment well. She only complains of mouth sores which are only mildly helped with medicated mouthwash. She otherwise feels well. She has no neurologic complaints. She denies any recent fevers or illnesses. She has a Ryan appetite and denies weight loss. She denies any pain. She has no chest pain or shortness of breath. She denies any nausea, vomiting, constipation, or diarrhea. She has no urinary complaints. Patient offers no further specific complaints today.  REVIEW OF SYSTEMS:   Review of Systems  Constitutional: Negative.   HENT:       Small ulcerations on lateral tongue.  Respiratory: Negative.   Cardiovascular: Negative.   Neurological: Negative.   Psychiatric/Behavioral: The patient is nervous/anxious.     As per HPI. Otherwise, a complete review of systems is negatve.  PAST MEDICAL HISTORY: Past Medical History  Diagnosis Date  . Breast cancer   . Seasonal allergies   . Frequent sinus infections   . Hypertension     PAST SURGICAL HISTORY: Past Surgical History  Procedure Laterality Date  . Breast biopsy Left 04/27/2015    results unknown  . Mastectomy, partial Left 05/19/2015    Procedure: MASTECTOMY PARTIAL;  Surgeon: Brittany Glen, MD;  Location: [facility];  Service: General;  Laterality: Left;  . Breast lumpectomy with needle localization Left 05/19/2015    Procedure: BREAST LUMPECTOMY WITH NEEDLE LOCALIZATION;  Surgeon: Brittany Glen, MD;  Location: [facility];   Service: General;  Laterality: Left;  . Breast lumpectomy with sentinel lymph node biopsy Left 05/19/2015    Procedure: BREAST LUMPECTOMY WITH SENTINEL LYMPH NODE BX;  Surgeon: Brittany Glen, MD;  Location: [facility];  Service: General;  Laterality: Left;  . Portacath placement Left 05/19/2015    Procedure: INSERTION PORT-A-CATH;  Surgeon: Brittany Glen, MD;  Location: [facility];  Service: General;  Laterality: Left;    FAMILY HISTORY Family History  Problem Relation Age of Onset  . Cancer Father   . Hypertension Father   . CAD Father   . Heart attack Father        ADVANCED DIRECTIVES:    HEALTH MAINTENANCE: Social History  Substance Use Topics  . Smoking status: Never Smoker   . Smokeless tobacco: Never Used  . Alcohol Use: Yes     Comment: rare beer     Colonoscopy:  PAP:  Bone density:  Lipid panel:  No Known Allergies  Current Outpatient Prescriptions  Medication Sig Dispense Refill  . acetaminophen (TYLENOL) 500 MG tablet Take 1,000 mg by mouth every 6 (six) hours as needed for mild pain or headache.    . B Complex Vitamins (VITAMIN B COMPLEX PO) Take 1 tablet by mouth daily as needed.    . Calcium Carbonate-Vitamin D (CALCIUM-VITAMIN D) 500-200 MG-UNIT per tablet Take 1 tablet by mouth daily.    . chlorpheniramine-HYDROcodone (TUSSIONEX) 10-8 MG/5ML SUER Take 5 mLs by mouth every 12 (twelve) hours as needed for cough. 140 mL 0  . diphenhydramine-acetaminophen (TYLENOL PM) 25-500 MG TABS Take 1-2 tablets by mouth at  bedtime as needed.    . fluticasone (FLONASE) 50 MCG/ACT nasal spray Place into the nose.    Marland Kitchen Hydrocodone-Acetaminophen (VICODIN) 5-300 MG TABS Take 1 tablet by mouth every 4 (four) hours as needed. 30 each 0  . lidocaine-prilocaine (EMLA) cream Apply 1 application topically as needed. Apply to port and cover with saran wrap 1-2 hours before chemotherapy 30 g 2  . loratadine (CLARITIN) 10 MG tablet Take by mouth.    . magic mouthwash SOLN Take 5 mLs  by mouth 4 (four) times daily as needed for mouth pain. 480 mL 1  . ondansetron (ZOFRAN-ODT) 4 MG disintegrating tablet Take 1 tablet (4 mg total) by mouth every 8 (eight) hours as needed for nausea or vomiting. 20 tablet 0  . polyethylene glycol powder (GLYCOLAX/MIRALAX) powder Take 1 Container by mouth once.    . prochlorperazine (COMPAZINE) 10 MG tablet Take 1 tablet (10 mg total) by mouth every 6 (six) hours as needed for nausea or vomiting. 60 tablet 0  . senna (SENOKOT) 8.6 MG TABS tablet Take 1 tablet by mouth.     No current facility-administered medications for this visit.   Facility-Administered Medications Ordered in Other Visits  Medication Dose Route Frequency Brittany Ryan Last Rate Last Dose  . heparin lock flush 100 unit/mL  500 Units Intravenous Once Brittany Huger, MD      . sodium chloride 0.9 % injection 10 mL  10 mL Intracatheter PRN Brittany Huger, MD   10 mL at 07/22/15 0956    OBJECTIVE: There were no vitals filed for this visit.   There is no weight on file to calculate BMI.    ECOG FS:0 - Asymptomatic  General: Well-developed, well-nourished, no acute distress. Eyes: anicteric sclera. HEENT: Small ulceration on right lateral tongue. Breasts: Exam deferred today. Lungs: Clear to auscultation bilaterally. Heart: Regular rate and rhythm. No rubs, murmurs, or gallops. Abdomen: Soft, nontender, nondistended. No organomegaly noted, normoactive bowel sounds. Musculoskeletal: No edema, cyanosis, or clubbing. Neuro: Alert, answering all questions appropriately. Cranial nerves grossly intact. Skin: No rashes or petechiae noted. Psych: Normal affect.   LAB RESULTS:  Lab Results  Component Value Date   NA 137 08/26/2015   K 3.5 08/26/2015   CL 103 08/26/2015   CO2 26 08/26/2015   GLUCOSE 125* 08/26/2015   BUN 22* 08/26/2015   CREATININE 0.72 08/26/2015   CALCIUM 8.6* 08/26/2015   PROT 6.3* 08/26/2015   ALBUMIN 3.5 08/26/2015   AST 20 08/26/2015   ALT 18  08/26/2015   ALKPHOS 84 08/26/2015   BILITOT 0.4 08/26/2015   GFRNONAA >60 08/26/2015   GFRAA >60 08/26/2015    Lab Results  Component Value Date   WBC 8.0 08/26/2015   NEUTROABS 5.8 08/26/2015   HGB 10.6* 08/26/2015   HCT 31.4* 08/26/2015   MCV 97.5 08/26/2015   PLT 322 08/26/2015     STUDIES: No results found.  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  PLAN:    1. Breast cancer: Patient has now completed 4 cycles of dose dense Adriamycin plus Cytoxan. Proceed with cycle 2 of 12 of weekly Taxol. At the conclusion of her chemotherapy, patient will require adjuvant XRT. An aromatase inhibitor would not offer benefit given the triple negative status of her disease. Return to clinic in 1 week for consideration of cycle 2 of 12.  2. Anxiety: Improving, monitor.  3. Anemia: Patient's hemoglobin is essentially stable.  Secondary  to chemotherapy, monitor. 4. Mouth ulcerations: Continue medicated mouthwash. Recommended patient also gargle with baking soda and water.   Patient expressed understanding and was in agreement with this plan. She also understands that She can call clinic at any time with any questions, concerns, or complaints.   Breast cancer   Staging form: Breast, AJCC 7th Edition     Clinical stage from 06/04/2015: Stage IA (T1c, N0, M0) - Signed by Brittany Huger, MD on 06/04/2015   Brittany Huger, MD   08/28/2015 8:50 AM

## 2015-08-28 NOTE — Progress Notes (Signed)
[place]  Telephone:(336) [phone] Fax:(336) [phone]  ID: Brittany Ryan OB: 1961/02/08  MR#: [id number]  OTL#:[id number]  Patient Care Team: [name], NP as PCP - General (Nurse Practitioner) [name], NP (Nurse Practitioner) Florene Glen, MD (Surgery)  CHIEF COMPLAINT: Breast cancer. No chief complaint on file.   INTERVAL HISTORY: Patient returns to clinic today for further evaluation and consideration of cycle 1 of 12 of weekly Taxol. Patient completed 4 cycles of dose dense Adriamycin and Cytoxan. She currently feels well. She has no neurologic complaints. She denies any recent fevers or illnesses. She has a good appetite and denies weight loss. She denies any pain. She has no chest pain or shortness of breath. She denies any nausea, vomiting, constipation, or diarrhea. She has no urinary complaints. Patient offers no specific complaints today.  REVIEW OF SYSTEMS:   Review of Systems  Constitutional: Negative.   Respiratory: Negative.   Cardiovascular: Negative.   Neurological: Negative.   Psychiatric/Behavioral: The patient is nervous/anxious.     As per HPI. Otherwise, a complete review of systems is negatve.  PAST MEDICAL HISTORY: Past Medical History  Diagnosis Date  . Breast cancer   . Seasonal allergies   . Frequent sinus infections   . Hypertension     PAST SURGICAL HISTORY: Past Surgical History  Procedure Laterality Date  . Breast biopsy Left 04/27/2015    results unknown  . Mastectomy, partial Left 05/19/2015    Procedure: MASTECTOMY PARTIAL;  Surgeon: Florene Glen, MD;  Location: [facility];  Service: General;  Laterality: Left;  . Breast lumpectomy with needle localization Left 05/19/2015    Procedure: BREAST LUMPECTOMY WITH NEEDLE LOCALIZATION;  Surgeon: Florene Glen, MD;  Location: [facility];  Service: General;  Laterality: Left;  . Breast lumpectomy with sentinel lymph node biopsy Left 05/19/2015    Procedure: BREAST  LUMPECTOMY WITH SENTINEL LYMPH NODE BX;  Surgeon: Florene Glen, MD;  Location: [facility];  Service: General;  Laterality: Left;  . Portacath placement Left 05/19/2015    Procedure: INSERTION PORT-A-CATH;  Surgeon: Florene Glen, MD;  Location: [facility];  Service: General;  Laterality: Left;    FAMILY HISTORY Family History  Problem Relation Age of Onset  . Cancer Father   . Hypertension Father   . CAD Father   . Heart attack Father        ADVANCED DIRECTIVES:    HEALTH MAINTENANCE: Social History  Substance Use Topics  . Smoking status: Never Smoker   . Smokeless tobacco: Never Used  . Alcohol Use: Yes     Comment: rare beer     Colonoscopy:  PAP:  Bone density:  Lipid panel:  No Known Allergies  Current Outpatient Prescriptions  Medication Sig Dispense Refill  . acetaminophen (TYLENOL) 500 MG tablet Take 1,000 mg by mouth every 6 (six) hours as needed for mild pain or headache.    . B Complex Vitamins (VITAMIN B COMPLEX PO) Take 1 tablet by mouth daily as needed.    . Calcium Carbonate-Vitamin D (CALCIUM-VITAMIN D) 500-200 MG-UNIT per tablet Take 1 tablet by mouth daily.    . chlorpheniramine-HYDROcodone (TUSSIONEX) 10-8 MG/5ML SUER Take 5 mLs by mouth every 12 (twelve) hours as needed for cough. 140 mL 0  . diphenhydramine-acetaminophen (TYLENOL PM) 25-500 MG TABS Take 1-2 tablets by mouth at bedtime as needed.    . fluticasone (FLONASE) 50 MCG/ACT nasal spray Place into the nose.    Marland Kitchen Hydrocodone-Acetaminophen (VICODIN) 5-300  MG TABS Take 1 tablet by mouth every 4 (four) hours as needed. 30 each 0  . lidocaine-prilocaine (EMLA) cream Apply 1 application topically as needed. Apply to port and cover with saran wrap 1-2 hours before chemotherapy 30 g 2  . loratadine (CLARITIN) 10 MG tablet Take by mouth.    . magic mouthwash SOLN Take 5 mLs by mouth 4 (four) times daily as needed for mouth pain. 480 mL 1  . ondansetron (ZOFRAN-ODT) 4 MG disintegrating tablet Take 1  tablet (4 mg total) by mouth every 8 (eight) hours as needed for nausea or vomiting. 20 tablet 0  . polyethylene glycol powder (GLYCOLAX/MIRALAX) powder Take 1 Container by mouth once.    . prochlorperazine (COMPAZINE) 10 MG tablet Take 1 tablet (10 mg total) by mouth every 6 (six) hours as needed for nausea or vomiting. 60 tablet 0  . senna (SENOKOT) 8.6 MG TABS tablet Take 1 tablet by mouth.     No current facility-administered medications for this visit.   Facility-Administered Medications Ordered in Other Visits  Medication Dose Route Frequency Provider Last Rate Last Dose  . heparin lock flush 100 unit/mL  500 Units Intravenous Once Lloyd Huger, MD      . sodium chloride 0.9 % injection 10 mL  10 mL Intracatheter PRN Lloyd Huger, MD   10 mL at 07/22/15 0956    OBJECTIVE: There were no vitals filed for this visit.   There is no weight on file to calculate BMI.    ECOG FS:0 - Asymptomatic  General: Well-developed, well-nourished, no acute distress. Eyes: anicteric sclera. Breasts: Exam deferred today. Lungs: Clear to auscultation bilaterally. Heart: Regular rate and rhythm. No rubs, murmurs, or gallops. Abdomen: Soft, nontender, nondistended. No organomegaly noted, normoactive bowel sounds. Musculoskeletal: No edema, cyanosis, or clubbing. Neuro: Alert, answering all questions appropriately. Cranial nerves grossly intact. Skin: No rashes or petechiae noted. Psych: Normal affect.   LAB RESULTS:  Lab Results  Component Value Date   NA 137 08/26/2015   K 3.5 08/26/2015   CL 103 08/26/2015   CO2 26 08/26/2015   GLUCOSE 125* 08/26/2015   BUN 22* 08/26/2015   CREATININE 0.72 08/26/2015   CALCIUM 8.6* 08/26/2015   PROT 6.3* 08/26/2015   ALBUMIN 3.5 08/26/2015   AST 20 08/26/2015   ALT 18 08/26/2015   ALKPHOS 84 08/26/2015   BILITOT 0.4 08/26/2015   GFRNONAA >60 08/26/2015   GFRAA >60 08/26/2015    Lab Results  Component Value Date   WBC 8.0 08/26/2015    NEUTROABS 5.8 08/26/2015   HGB 10.6* 08/26/2015   HCT 31.4* 08/26/2015   MCV 97.5 08/26/2015   PLT 322 08/26/2015     STUDIES: No results found.  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  PLAN:    1. Breast cancer: Patient has now completed 4 cycles of dose dense Adriamycin plus Cytoxan. Proceed with cycle 1 of 12 of weekly Taxol. At the conclusion of her chemotherapy, patient will require adjuvant XRT. An aromatase inhibitor would not offer benefit given the triple negative status of her disease. Return to clinic in 1 week for consideration of cycle 2 of 12.  2. Anxiety: Improving, monitor.  3. Anemia: Patient's hemoglobin is essentially stable.  Secondary to chemotherapy, monitor.   Patient expressed understanding and was in agreement with this plan. She also understands that She can call clinic at any time with any questions, concerns, or  complaints.   Breast cancer   Staging form: Breast, AJCC 7th Edition     Clinical stage from 06/04/2015: Stage IA (T1c, N0, M0) - Signed by Lloyd Huger, MD on 06/04/2015   Lloyd Huger, MD   08/28/2015 8:40 AM

## 2015-09-02 ENCOUNTER — Inpatient Hospital Stay: Payer: No Typology Code available for payment source

## 2015-09-02 ENCOUNTER — Inpatient Hospital Stay (HOSPITAL_BASED_OUTPATIENT_CLINIC_OR_DEPARTMENT_OTHER): Payer: No Typology Code available for payment source | Admitting: Oncology

## 2015-09-02 VITALS — BP 136/74 | HR 87 | Temp 97.6°F | Resp 20 | Wt 216.3 lb

## 2015-09-02 DIAGNOSIS — Z809 Family history of malignant neoplasm, unspecified: Secondary | ICD-10-CM

## 2015-09-02 DIAGNOSIS — K140 Glossitis: Secondary | ICD-10-CM

## 2015-09-02 DIAGNOSIS — Z79899 Other long term (current) drug therapy: Secondary | ICD-10-CM

## 2015-09-02 DIAGNOSIS — Z171 Estrogen receptor negative status [ER-]: Secondary | ICD-10-CM | POA: Diagnosis not present

## 2015-09-02 DIAGNOSIS — C50912 Malignant neoplasm of unspecified site of left female breast: Secondary | ICD-10-CM | POA: Diagnosis not present

## 2015-09-02 DIAGNOSIS — I10 Essential (primary) hypertension: Secondary | ICD-10-CM

## 2015-09-02 DIAGNOSIS — F419 Anxiety disorder, unspecified: Secondary | ICD-10-CM | POA: Diagnosis not present

## 2015-09-02 DIAGNOSIS — D6481 Anemia due to antineoplastic chemotherapy: Secondary | ICD-10-CM | POA: Diagnosis not present

## 2015-09-02 DIAGNOSIS — Z9012 Acquired absence of left breast and nipple: Secondary | ICD-10-CM

## 2015-09-02 LAB — CBC WITH DIFFERENTIAL/PLATELET
BASOS PCT: 2 %
Basophils Absolute: 0.1 10*3/uL (ref 0–0.1)
EOS ABS: 0.2 10*3/uL (ref 0–0.7)
Eosinophils Relative: 3 %
HEMATOCRIT: 30.3 % — AB (ref 35.0–47.0)
HEMOGLOBIN: 10.2 g/dL — AB (ref 12.0–16.0)
LYMPHS ABS: 0.6 10*3/uL — AB (ref 1.0–3.6)
Lymphocytes Relative: 11 %
MCH: 33.6 pg (ref 26.0–34.0)
MCHC: 33.7 g/dL (ref 32.0–36.0)
MCV: 99.7 fL (ref 80.0–100.0)
Monocytes Absolute: 0.5 10*3/uL (ref 0.2–0.9)
Monocytes Relative: 10 %
NEUTROS ABS: 4.1 10*3/uL (ref 1.4–6.5)
NEUTROS PCT: 74 %
Platelets: 245 10*3/uL (ref 150–440)
RBC: 3.03 MIL/uL — AB (ref 3.80–5.20)
RDW: 18.1 % — ABNORMAL HIGH (ref 11.5–14.5)
WBC: 5.6 10*3/uL (ref 3.6–11.0)

## 2015-09-02 LAB — COMPREHENSIVE METABOLIC PANEL
ALT: 21 U/L (ref 14–54)
ANION GAP: 9 (ref 5–15)
AST: 22 U/L (ref 15–41)
Albumin: 3.3 g/dL — ABNORMAL LOW (ref 3.5–5.0)
Alkaline Phosphatase: 75 U/L (ref 38–126)
BUN: 17 mg/dL (ref 6–20)
CALCIUM: 8.6 mg/dL — AB (ref 8.9–10.3)
CHLORIDE: 102 mmol/L (ref 101–111)
CO2: 26 mmol/L (ref 22–32)
CREATININE: 0.76 mg/dL (ref 0.44–1.00)
Glucose, Bld: 137 mg/dL — ABNORMAL HIGH (ref 65–99)
Potassium: 3.6 mmol/L (ref 3.5–5.1)
SODIUM: 137 mmol/L (ref 135–145)
Total Bilirubin: 0.5 mg/dL (ref 0.3–1.2)
Total Protein: 5.9 g/dL — ABNORMAL LOW (ref 6.5–8.1)

## 2015-09-02 MED ORDER — SODIUM CHLORIDE 0.9 % IV SOLN
Freq: Once | INTRAVENOUS | Status: AC
  Administered 2015-09-02: 10:00:00 via INTRAVENOUS
  Filled 2015-09-02: qty 1000

## 2015-09-02 MED ORDER — DIPHENHYDRAMINE HCL 50 MG/ML IJ SOLN
INTRAMUSCULAR | Status: AC
  Filled 2015-09-02: qty 1

## 2015-09-02 MED ORDER — HYDROCOD POLST-CPM POLST ER 10-8 MG/5ML PO SUER: 5.0000 mL | Freq: Two times a day (BID) | ORAL | Status: DC | PRN

## 2015-09-02 MED ORDER — FAMOTIDINE IN NACL 20-0.9 MG/50ML-% IV SOLN
20.0000 mg | Freq: Once | INTRAVENOUS | Status: AC
  Administered 2015-09-02: 20 mg via INTRAVENOUS

## 2015-09-02 MED ORDER — PACLITAXEL CHEMO INJECTION 300 MG/50ML
80.0000 mg/m2 | Freq: Once | INTRAVENOUS | Status: AC
  Administered 2015-09-02: 138 mg via INTRAVENOUS
  Filled 2015-09-02: qty 23

## 2015-09-02 MED ORDER — DIPHENHYDRAMINE HCL 50 MG/ML IJ SOLN
50.0000 mg | Freq: Once | INTRAMUSCULAR | Status: AC
  Administered 2015-09-02: 50 mg via INTRAVENOUS

## 2015-09-02 MED ORDER — SODIUM CHLORIDE 0.9 % IV SOLN
Freq: Once | INTRAVENOUS | Status: AC
  Administered 2015-09-02: 10:00:00 via INTRAVENOUS
  Filled 2015-09-02: qty 4

## 2015-09-02 MED ORDER — HEPARIN SOD (PORK) LOCK FLUSH 100 UNIT/ML IV SOLN
500.0000 [IU] | Freq: Once | INTRAVENOUS | Status: AC
  Administered 2015-09-02: 500 [IU] via INTRAVENOUS
  Filled 2015-09-02: qty 5

## 2015-09-02 MED ORDER — SODIUM CHLORIDE 0.9 % IJ SOLN
10.0000 mL | INTRAMUSCULAR | Status: AC | PRN
  Administered 2015-09-02: 10 mL via INTRAVENOUS
  Filled 2015-09-02: qty 10

## 2015-09-09 ENCOUNTER — Inpatient Hospital Stay: Payer: No Typology Code available for payment source | Attending: Oncology

## 2015-09-09 ENCOUNTER — Inpatient Hospital Stay (HOSPITAL_BASED_OUTPATIENT_CLINIC_OR_DEPARTMENT_OTHER): Payer: No Typology Code available for payment source | Admitting: Oncology

## 2015-09-09 ENCOUNTER — Inpatient Hospital Stay: Payer: No Typology Code available for payment source

## 2015-09-09 VITALS — BP 131/77 | HR 77 | Temp 97.1°F | Resp 20 | Wt 217.4 lb

## 2015-09-09 DIAGNOSIS — Z9012 Acquired absence of left breast and nipple: Secondary | ICD-10-CM | POA: Insufficient documentation

## 2015-09-09 DIAGNOSIS — D649 Anemia, unspecified: Secondary | ICD-10-CM

## 2015-09-09 DIAGNOSIS — R5383 Other fatigue: Secondary | ICD-10-CM

## 2015-09-09 DIAGNOSIS — Z170 Estrogen receptor positive status [ER+]: Secondary | ICD-10-CM | POA: Diagnosis not present

## 2015-09-09 DIAGNOSIS — R531 Weakness: Secondary | ICD-10-CM | POA: Insufficient documentation

## 2015-09-09 DIAGNOSIS — Z171 Estrogen receptor negative status [ER-]: Secondary | ICD-10-CM | POA: Diagnosis not present

## 2015-09-09 DIAGNOSIS — K1370 Unspecified lesions of oral mucosa: Secondary | ICD-10-CM | POA: Diagnosis not present

## 2015-09-09 DIAGNOSIS — C50912 Malignant neoplasm of unspecified site of left female breast: Secondary | ICD-10-CM

## 2015-09-09 DIAGNOSIS — Z809 Family history of malignant neoplasm, unspecified: Secondary | ICD-10-CM

## 2015-09-09 DIAGNOSIS — Z79899 Other long term (current) drug therapy: Secondary | ICD-10-CM | POA: Diagnosis not present

## 2015-09-09 DIAGNOSIS — I10 Essential (primary) hypertension: Secondary | ICD-10-CM | POA: Insufficient documentation

## 2015-09-09 DIAGNOSIS — F419 Anxiety disorder, unspecified: Secondary | ICD-10-CM | POA: Insufficient documentation

## 2015-09-09 DIAGNOSIS — M7989 Other specified soft tissue disorders: Secondary | ICD-10-CM | POA: Insufficient documentation

## 2015-09-09 DIAGNOSIS — Z5111 Encounter for antineoplastic chemotherapy: Secondary | ICD-10-CM | POA: Insufficient documentation

## 2015-09-09 LAB — CBC WITH DIFFERENTIAL/PLATELET
BASOS ABS: 0.1 10*3/uL (ref 0–0.1)
BASOS PCT: 2 %
EOS ABS: 0.3 10*3/uL (ref 0–0.7)
Eosinophils Relative: 7 %
HEMATOCRIT: 30 % — AB (ref 35.0–47.0)
HEMOGLOBIN: 10.2 g/dL — AB (ref 12.0–16.0)
Lymphocytes Relative: 13 %
Lymphs Abs: 0.7 10*3/uL — ABNORMAL LOW (ref 1.0–3.6)
MCH: 34.1 pg — ABNORMAL HIGH (ref 26.0–34.0)
MCHC: 34.2 g/dL (ref 32.0–36.0)
MCV: 99.8 fL (ref 80.0–100.0)
Monocytes Absolute: 0.4 10*3/uL (ref 0.2–0.9)
Monocytes Relative: 8 %
NEUTROS ABS: 3.4 10*3/uL (ref 1.4–6.5)
NEUTROS PCT: 70 %
Platelets: 236 10*3/uL (ref 150–440)
RBC: 3 MIL/uL — AB (ref 3.80–5.20)
RDW: 16.9 % — ABNORMAL HIGH (ref 11.5–14.5)
WBC: 4.9 10*3/uL (ref 3.6–11.0)

## 2015-09-09 LAB — COMPREHENSIVE METABOLIC PANEL
ALBUMIN: 3.4 g/dL — AB (ref 3.5–5.0)
ALK PHOS: 60 U/L (ref 38–126)
ALT: 25 U/L (ref 14–54)
ANION GAP: 6 (ref 5–15)
AST: 25 U/L (ref 15–41)
BILIRUBIN TOTAL: 0.7 mg/dL (ref 0.3–1.2)
BUN: 17 mg/dL (ref 6–20)
CALCIUM: 8.8 mg/dL — AB (ref 8.9–10.3)
CO2: 29 mmol/L (ref 22–32)
Chloride: 103 mmol/L (ref 101–111)
Creatinine, Ser: 0.76 mg/dL (ref 0.44–1.00)
GFR calc Af Amer: >60 mL/min (ref >60)
GFR calc non Af Amer: >60 mL/min (ref >60)
GLUCOSE: 143 mg/dL — AB (ref 65–99)
POTASSIUM: 3.5 mmol/L (ref 3.5–5.1)
SODIUM: 138 mmol/L (ref 135–145)
TOTAL PROTEIN: 6.2 g/dL — AB (ref 6.5–8.1)

## 2015-09-09 MED ORDER — HEPARIN SOD (PORK) LOCK FLUSH 100 UNIT/ML IV SOLN
500.0000 [IU] | Freq: Once | INTRAVENOUS | Status: DC | PRN
  Filled 2015-09-09: qty 5

## 2015-09-09 MED ORDER — SODIUM CHLORIDE 0.9 % IJ SOLN
10.0000 mL | INTRAMUSCULAR | Status: DC | PRN
  Administered 2015-09-09: 10 mL
  Filled 2015-09-09: qty 10

## 2015-09-09 MED ORDER — DEXAMETHASONE SODIUM PHOSPHATE 10 MG/ML IJ SOLN
INTRAMUSCULAR | Status: AC
  Filled 2015-09-09: qty 1

## 2015-09-09 MED ORDER — SODIUM CHLORIDE 0.9 % IV SOLN
Freq: Once | INTRAVENOUS | Status: AC
  Administered 2015-09-09: 10:00:00 via INTRAVENOUS
  Filled 2015-09-09: qty 4

## 2015-09-09 MED ORDER — SODIUM CHLORIDE 0.9 % IV SOLN
Freq: Once | INTRAVENOUS | Status: AC
Start: 2015-09-09 — End: 2015-09-09
  Administered 2015-09-09: 10:00:00 via INTRAVENOUS
  Filled 2015-09-09: qty 1000

## 2015-09-09 MED ORDER — PACLITAXEL CHEMO INJECTION 300 MG/50ML
80.0000 mg/m2 | Freq: Once | INTRAVENOUS | Status: AC
  Administered 2015-09-09: 138 mg via INTRAVENOUS
  Filled 2015-09-09: qty 23

## 2015-09-09 MED ORDER — FAMOTIDINE IN NACL 20-0.9 MG/50ML-% IV SOLN
20.0000 mg | Freq: Once | INTRAVENOUS | Status: AC
  Administered 2015-09-09: 20 mg via INTRAVENOUS

## 2015-09-09 MED ORDER — ONDANSETRON HCL 4 MG/2ML IJ SOLN
INTRAMUSCULAR | Status: AC
  Filled 2015-09-09: qty 4

## 2015-09-09 MED ORDER — DIPHENHYDRAMINE HCL 50 MG/ML IJ SOLN
50.0000 mg | Freq: Once | INTRAMUSCULAR | Status: AC
  Administered 2015-09-09: 50 mg via INTRAVENOUS
  Filled 2015-09-09: qty 1

## 2015-09-11 NOTE — Progress Notes (Signed)
[place]  [phone]) [phone] Fax:(336) [phone]  ID: [name] OB: July 19, 1961  MR#: [id number]  GYK#:[id number]  Patient Care Team: [name], NP as PCP - General (Nurse Practitioner) [name], NP (Nurse Practitioner) Florene Glen, MD (Surgery)  CHIEF COMPLAINT: Breast cancer. No chief complaint on file.   INTERVAL HISTORY: Patient returns to clinic today for further evaluation and consideration of cycle 3 of 12 of weekly Taxol. Her mouth sores are still evident, but improved. She otherwise feels well. She has no neurologic complaints. She denies any recent fevers or illnesses. She has a good appetite and denies weight loss. She denies any pain. She has no chest pain or shortness of breath. She denies any nausea, vomiting, constipation, or diarrhea. She has no urinary complaints. Patient offers no further specific complaints today.  REVIEW OF SYSTEMS:   Review of Systems  Constitutional: Negative.   HENT:       Small ulcerations on lateral tongue, unchanged  Respiratory: Negative.   Cardiovascular: Negative.   Neurological: Negative.   Psychiatric/Behavioral: The patient is nervous/anxious.     As per HPI. Otherwise, a complete review of systems is negatve.  PAST MEDICAL HISTORY: Past Medical History  Diagnosis Date  . Breast cancer   . Seasonal allergies   . Frequent sinus infections   . Hypertension     PAST SURGICAL HISTORY: Past Surgical History  Procedure Laterality Date  . Breast biopsy Left 04/27/2015    results unknown  . Mastectomy, partial Left 05/19/2015    Procedure: MASTECTOMY PARTIAL;  Surgeon: Florene Glen, MD;  Location: [facility];  Service: General;  Laterality: Left;  . Breast lumpectomy with needle localization Left 05/19/2015    Procedure: BREAST LUMPECTOMY WITH NEEDLE LOCALIZATION;  Surgeon: Florene Glen, MD;  Location: [facility];  Service: General;  Laterality: Left;  . Breast lumpectomy with sentinel  lymph node biopsy Left 05/19/2015    Procedure: BREAST LUMPECTOMY WITH SENTINEL LYMPH NODE BX;  Surgeon: Florene Glen, MD;  Location: [facility];  Service: General;  Laterality: Left;  . Portacath placement Left 05/19/2015    Procedure: INSERTION PORT-A-CATH;  Surgeon: Florene Glen, MD;  Location: [facility];  Service: General;  Laterality: Left;    FAMILY HISTORY Family History  Problem Relation Age of Onset  . Cancer Father   . Hypertension Father   . CAD Father   . Heart attack Father        ADVANCED DIRECTIVES:    HEALTH MAINTENANCE: Social History  Substance Use Topics  . Smoking status: Never Smoker   . Smokeless tobacco: Never Used  . Alcohol Use: Yes     Comment: rare beer     Colonoscopy:  PAP:  Bone density:  Lipid panel:  No Known Allergies  Current Outpatient Prescriptions  Medication Sig Dispense Refill  . acetaminophen (TYLENOL) 500 MG tablet Take 1,000 mg by mouth every 6 (six) hours as needed for mild pain or headache.    . B Complex Vitamins (VITAMIN B COMPLEX PO) Take 1 tablet by mouth daily as needed.    . Calcium Carbonate-Vitamin D (CALCIUM-VITAMIN D) 500-200 MG-UNIT per tablet Take 1 tablet by mouth daily.    . chlorpheniramine-HYDROcodone (TUSSIONEX) 10-8 MG/5ML SUER Take 5 mLs by mouth every 12 (twelve) hours as needed for cough. 140 mL 0  . diphenhydramine-acetaminophen (TYLENOL PM) 25-500 MG TABS Take 1-2 tablets by mouth at bedtime as needed.    . fluticasone (FLONASE) 50 MCG/ACT  nasal spray Place into the nose.    Marland Kitchen Hydrocodone-Acetaminophen (VICODIN) 5-300 MG TABS Take 1 tablet by mouth every 4 (four) hours as needed. 30 each 0  . lidocaine-prilocaine (EMLA) cream Apply 1 application topically as needed. Apply to port and cover with saran wrap 1-2 hours before chemotherapy 30 g 2  . loratadine (CLARITIN) 10 MG tablet Take by mouth.    . magic mouthwash SOLN Take 5 mLs by mouth 4 (four) times daily as needed for mouth pain. 480 mL 1  .  ondansetron (ZOFRAN-ODT) 4 MG disintegrating tablet Take 1 tablet (4 mg total) by mouth every 8 (eight) hours as needed for nausea or vomiting. 20 tablet 0  . polyethylene glycol powder (GLYCOLAX/MIRALAX) powder Take 1 Container by mouth once.    . prochlorperazine (COMPAZINE) 10 MG tablet Take 1 tablet (10 mg total) by mouth every 6 (six) hours as needed for nausea or vomiting. 60 tablet 0  . senna (SENOKOT) 8.6 MG TABS tablet Take 1 tablet by mouth.     No current facility-administered medications for this visit.   Facility-Administered Medications Ordered in Other Visits  Medication Dose Route Frequency Provider Last Rate Last Dose  . heparin lock flush 100 unit/mL  500 Units Intravenous Once Lloyd Huger, MD      . sodium chloride 0.9 % injection 10 mL  10 mL Intracatheter PRN Lloyd Huger, MD   10 mL at 07/22/15 0956  . sodium chloride 0.9 % injection 10 mL  10 mL Intravenous PRN Lloyd Huger, MD   10 mL at 09/02/15 0941    OBJECTIVE: There were no vitals filed for this visit.   There is no weight on file to calculate BMI.    ECOG FS:0 - Asymptomatic  General: Well-developed, well-nourished, no acute distress. Eyes: anicteric sclera. HEENT: Small ulceration on right lateral tongue. Breasts: Exam deferred today. Lungs: Clear to auscultation bilaterally. Heart: Regular rate and rhythm. No rubs, murmurs, or gallops. Abdomen: Soft, nontender, nondistended. No organomegaly noted, normoactive bowel sounds. Musculoskeletal: No edema, cyanosis, or clubbing. Neuro: Alert, answering all questions appropriately. Cranial nerves grossly intact. Skin: No rashes or petechiae noted. Psych: Normal affect.   LAB RESULTS:  Lab Results  Component Value Date   NA 138 09/09/2015   K 3.5 09/09/2015   CL 103 09/09/2015   CO2 29 09/09/2015   GLUCOSE 143* 09/09/2015   BUN 17 09/09/2015   CREATININE 0.76 09/09/2015   CALCIUM 8.8* 09/09/2015   PROT 6.2* 09/09/2015   ALBUMIN 3.4*  09/09/2015   AST 25 09/09/2015   ALT 25 09/09/2015   ALKPHOS 60 09/09/2015   BILITOT 0.7 09/09/2015   GFRNONAA >60 09/09/2015   GFRAA >60 09/09/2015    Lab Results  Component Value Date   WBC 4.9 09/09/2015   NEUTROABS 3.4 09/09/2015   HGB 10.2* 09/09/2015   HCT 30.0* 09/09/2015   MCV 99.8 09/09/2015   PLT 236 09/09/2015     STUDIES: No results found.  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  PLAN:    1. Breast cancer: Patient has now completed 4 cycles of dose dense Adriamycin plus Cytoxan. Proceed with cycle 3 of 12 of weekly Taxol. At the conclusion of her chemotherapy, patient will require adjuvant XRT. An aromatase inhibitor would not offer benefit given the triple negative status of her disease. Return to clinic in 1 week for consideration of cycle 4 of 12.  2. Anxiety: Improving, monitor.  3. Anemia: Patient's hemoglobin is essentially stable.  Secondary to chemotherapy, monitor. 4. Mouth ulcerations: Continue medicated mouthwash and baking soda with water.  Patient expressed understanding and was in agreement with this plan. She also understands that She can call clinic at any time with any questions, concerns, or complaints.   Breast cancer   Staging form: Breast, AJCC 7th Edition     Clinical stage from 06/04/2015: Stage IA (T1c, N0, M0) - Signed by Lloyd Huger, MD on 06/04/2015   Lloyd Huger, MD   09/11/2015 12:18 PM

## 2015-09-11 NOTE — Progress Notes (Signed)
[facility]  Telephone:(336) [phone] Fax:(336) [phone]  ID: Brittany Ryan OB: 01/27/1961  MR#: [id number]  YBO#:[id number]  Patient Care Team: [name], NP as PCP - General (Nurse Practitioner) [name], NP (Nurse Practitioner) Florene Glen, MD (Surgery)  CHIEF COMPLAINT: Breast cancer. No chief complaint on file.   INTERVAL HISTORY: Patient returns to clinic today for further evaluation and consideration of cycle 4 of 12 of weekly Taxol. Her mouth sores are improved. She has more weakness and fatigue, but otherwise feels well. She has no neurologic complaints. She denies any recent fevers or illnesses. She has a good appetite and denies weight loss. She denies any pain. She has no chest pain or shortness of breath. She denies any nausea, vomiting, constipation, or diarrhea. She has no urinary complaints. Patient offers no further specific complaints today.  REVIEW OF SYSTEMS:   Review of Systems  Constitutional: Positive for malaise/fatigue.  Respiratory: Negative.   Cardiovascular: Negative.   Neurological: Positive for weakness.  Psychiatric/Behavioral: The patient is nervous/anxious.     As per HPI. Otherwise, a complete review of systems is negatve.  PAST MEDICAL HISTORY: Past Medical History  Diagnosis Date  . Breast cancer   . Seasonal allergies   . Frequent sinus infections   . Hypertension     PAST SURGICAL HISTORY: Past Surgical History  Procedure Laterality Date  . Breast biopsy Left 04/27/2015    results unknown  . Mastectomy, partial Left 05/19/2015    Procedure: MASTECTOMY PARTIAL;  Surgeon: Florene Glen, MD;  Location: [facility];  Service: General;  Laterality: Left;  . Breast lumpectomy with needle localization Left 05/19/2015    Procedure: BREAST LUMPECTOMY WITH NEEDLE LOCALIZATION;  Surgeon: Florene Glen, MD;  Location: [facility];  Service: General;  Laterality: Left;  . Breast lumpectomy with sentinel lymph node  biopsy Left 05/19/2015    Procedure: BREAST LUMPECTOMY WITH SENTINEL LYMPH NODE BX;  Surgeon: Florene Glen, MD;  Location: [facility];  Service: General;  Laterality: Left;  . Portacath placement Left 05/19/2015    Procedure: INSERTION PORT-A-CATH;  Surgeon: Florene Glen, MD;  Location: [facility];  Service: General;  Laterality: Left;    FAMILY HISTORY Family History  Problem Relation Age of Onset  . Cancer Father   . Hypertension Father   . CAD Father   . Heart attack Father        ADVANCED DIRECTIVES:    HEALTH MAINTENANCE: Social History  Substance Use Topics  . Smoking status: Never Smoker   . Smokeless tobacco: Never Used  . Alcohol Use: Yes     Comment: rare beer     Colonoscopy:  PAP:  Bone density:  Lipid panel:  No Known Allergies  Current Outpatient Prescriptions  Medication Sig Dispense Refill  . acetaminophen (TYLENOL) 500 MG tablet Take 1,000 mg by mouth every 6 (six) hours as needed for mild pain or headache.    . B Complex Vitamins (VITAMIN B COMPLEX PO) Take 1 tablet by mouth daily as needed.    . Calcium Carbonate-Vitamin D (CALCIUM-VITAMIN D) 500-200 MG-UNIT per tablet Take 1 tablet by mouth daily.    . chlorpheniramine-HYDROcodone (TUSSIONEX) 10-8 MG/5ML SUER Take 5 mLs by mouth every 12 (twelve) hours as needed for cough. 140 mL 0  . diphenhydramine-acetaminophen (TYLENOL PM) 25-500 MG TABS Take 1-2 tablets by mouth at bedtime as needed.    . fluticasone (FLONASE) 50 MCG/ACT nasal spray Place into the nose.    Marland Kitchen  Hydrocodone-Acetaminophen (VICODIN) 5-300 MG TABS Take 1 tablet by mouth every 4 (four) hours as needed. 30 each 0  . lidocaine-prilocaine (EMLA) cream Apply 1 application topically as needed. Apply to port and cover with saran wrap 1-2 hours before chemotherapy 30 g 2  . loratadine (CLARITIN) 10 MG tablet Take by mouth.    . magic mouthwash SOLN Take 5 mLs by mouth 4 (four) times daily as needed for mouth pain. 480 mL 1  . ondansetron  (ZOFRAN-ODT) 4 MG disintegrating tablet Take 1 tablet (4 mg total) by mouth every 8 (eight) hours as needed for nausea or vomiting. 20 tablet 0  . polyethylene glycol powder (GLYCOLAX/MIRALAX) powder Take 1 Container by mouth once.    . prochlorperazine (COMPAZINE) 10 MG tablet Take 1 tablet (10 mg total) by mouth every 6 (six) hours as needed for nausea or vomiting. 60 tablet 0  . senna (SENOKOT) 8.6 MG TABS tablet Take 1 tablet by mouth.     No current facility-administered medications for this visit.   Facility-Administered Medications Ordered in Other Visits  Medication Dose Route Frequency Provider Last Rate Last Dose  . heparin lock flush 100 unit/mL  500 Units Intravenous Once Lloyd Huger, MD      . sodium chloride 0.9 % injection 10 mL  10 mL Intracatheter PRN Lloyd Huger, MD   10 mL at 07/22/15 0956  . sodium chloride 0.9 % injection 10 mL  10 mL Intravenous PRN Lloyd Huger, MD   10 mL at 09/02/15 0941    OBJECTIVE: There were no vitals filed for this visit.   There is no weight on file to calculate BMI.    ECOG FS:0 - Asymptomatic  General: Well-developed, well-nourished, no acute distress. Eyes: anicteric sclera. Breasts: Exam deferred today. Lungs: Clear to auscultation bilaterally. Heart: Regular rate and rhythm. No rubs, murmurs, or gallops. Abdomen: Soft, nontender, nondistended. No organomegaly noted, normoactive bowel sounds. Musculoskeletal: No edema, cyanosis, or clubbing. Neuro: Alert, answering all questions appropriately. Cranial nerves grossly intact. Skin: No rashes or petechiae noted. Psych: Normal affect.   LAB RESULTS:  Lab Results  Component Value Date   NA 138 09/09/2015   K 3.5 09/09/2015   CL 103 09/09/2015   CO2 29 09/09/2015   GLUCOSE 143* 09/09/2015   BUN 17 09/09/2015   CREATININE 0.76 09/09/2015   CALCIUM 8.8* 09/09/2015   PROT 6.2* 09/09/2015   ALBUMIN 3.4* 09/09/2015   AST 25 09/09/2015   ALT 25 09/09/2015    ALKPHOS 60 09/09/2015   BILITOT 0.7 09/09/2015   GFRNONAA >60 09/09/2015   GFRAA >60 09/09/2015    Lab Results  Component Value Date   WBC 4.9 09/09/2015   NEUTROABS 3.4 09/09/2015   HGB 10.2* 09/09/2015   HCT 30.0* 09/09/2015   MCV 99.8 09/09/2015   PLT 236 09/09/2015     STUDIES: No results found.  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  PLAN:    1. Breast cancer: Patient has now completed 4 cycles of dose dense Adriamycin plus Cytoxan. Proceed with cycle 4 of 12 of weekly Taxol. At the conclusion of her chemotherapy, patient will require adjuvant XRT. An aromatase inhibitor would not offer benefit given the triple negative status of her disease. Return to clinic in 1 week for consideration of cycle 5 and then in 2 weeks for further evaluation and consideration of cycle 6 of 12.  2. Anxiety: Improving, monitor.  3. Anemia: Patient's hemoglobin is essentially stable.  Secondary to chemotherapy, monitor. 4. Mouth ulcerations: Continue medicated mouthwash and baking soda with water.  Patient expressed understanding and was in agreement with this plan. She also understands that She can call clinic at any time with any questions, concerns, or complaints.   Breast cancer   Staging form: Breast, AJCC 7th Edition     Clinical stage from 06/04/2015: Stage IA (T1c, N0, M0) - Signed by Lloyd Huger, MD on 06/04/2015   Lloyd Huger, MD   09/11/2015 12:19 PM

## 2015-09-16 ENCOUNTER — Inpatient Hospital Stay: Payer: No Typology Code available for payment source

## 2015-09-16 ENCOUNTER — Other Ambulatory Visit: Payer: Self-pay | Admitting: Family Medicine

## 2015-09-16 ENCOUNTER — Other Ambulatory Visit: Payer: Self-pay | Admitting: Oncology

## 2015-09-16 VITALS — BP 139/77 | HR 81 | Temp 99.0°F | Resp 19

## 2015-09-16 DIAGNOSIS — C50912 Malignant neoplasm of unspecified site of left female breast: Secondary | ICD-10-CM | POA: Diagnosis not present

## 2015-09-16 LAB — COMPREHENSIVE METABOLIC PANEL
ALT: 23 U/L (ref 14–54)
AST: 28 U/L (ref 15–41)
Albumin: 3.4 g/dL — ABNORMAL LOW (ref 3.5–5.0)
Alkaline Phosphatase: 60 U/L (ref 38–126)
Anion gap: 10 (ref 5–15)
BILIRUBIN TOTAL: 0.4 mg/dL (ref 0.3–1.2)
BUN: 15 mg/dL (ref 6–20)
CO2: 26 mmol/L (ref 22–32)
CREATININE: 0.91 mg/dL (ref 0.44–1.00)
Calcium: 8.8 mg/dL — ABNORMAL LOW (ref 8.9–10.3)
Chloride: 101 mmol/L (ref 101–111)
GFR calc Af Amer: >60 mL/min (ref >60)
GLUCOSE: 124 mg/dL — AB (ref 65–99)
POTASSIUM: 3.8 mmol/L (ref 3.5–5.1)
Sodium: 137 mmol/L (ref 135–145)
TOTAL PROTEIN: 6.2 g/dL — AB (ref 6.5–8.1)

## 2015-09-16 LAB — CBC WITH DIFFERENTIAL/PLATELET
BASOS PCT: 2 %
Basophils Absolute: 0.1 10*3/uL (ref 0–0.1)
EOS ABS: 0.3 10*3/uL (ref 0–0.7)
Eosinophils Relative: 6 %
HCT: 29.1 % — ABNORMAL LOW (ref 35.0–47.0)
HEMOGLOBIN: 9.9 g/dL — AB (ref 12.0–16.0)
Lymphocytes Relative: 13 %
Lymphs Abs: 0.6 10*3/uL — ABNORMAL LOW (ref 1.0–3.6)
MCH: 34.2 pg — ABNORMAL HIGH (ref 26.0–34.0)
MCHC: 34.1 g/dL (ref 32.0–36.0)
MCV: 100.1 fL — ABNORMAL HIGH (ref 80.0–100.0)
Monocytes Absolute: 0.4 10*3/uL (ref 0.2–0.9)
Monocytes Relative: 9 %
NEUTROS PCT: 70 %
Neutro Abs: 3.2 10*3/uL (ref 1.4–6.5)
Platelets: 265 10*3/uL (ref 150–440)
RBC: 2.91 MIL/uL — AB (ref 3.80–5.20)
RDW: 15.8 % — ABNORMAL HIGH (ref 11.5–14.5)
WBC: 4.5 10*3/uL (ref 3.6–11.0)

## 2015-09-16 MED ORDER — SODIUM CHLORIDE 0.9 % IV SOLN
Freq: Once | INTRAVENOUS | Status: AC
  Administered 2015-09-16: 11:00:00 via INTRAVENOUS
  Filled 2015-09-16: qty 4

## 2015-09-16 MED ORDER — PACLITAXEL CHEMO INJECTION 300 MG/50ML
80.0000 mg/m2 | Freq: Once | INTRAVENOUS | Status: AC
  Administered 2015-09-16: 138 mg via INTRAVENOUS
  Filled 2015-09-16: qty 23

## 2015-09-16 MED ORDER — SODIUM CHLORIDE 0.9 % IJ SOLN
10.0000 mL | INTRAMUSCULAR | Status: DC | PRN
  Administered 2015-09-16: 10 mL
  Filled 2015-09-16: qty 10

## 2015-09-16 MED ORDER — HEPARIN SOD (PORK) LOCK FLUSH 100 UNIT/ML IV SOLN
500.0000 [IU] | Freq: Once | INTRAVENOUS | Status: AC | PRN
  Administered 2015-09-16: 500 [IU]
  Filled 2015-09-16: qty 5

## 2015-09-16 MED ORDER — FAMOTIDINE IN NACL 20-0.9 MG/50ML-% IV SOLN
20.0000 mg | Freq: Once | INTRAVENOUS | Status: AC
  Administered 2015-09-16: 20 mg via INTRAVENOUS

## 2015-09-16 MED ORDER — SODIUM CHLORIDE 0.9 % IV SOLN
Freq: Once | INTRAVENOUS | Status: AC
  Administered 2015-09-16: 09:00:00 via INTRAVENOUS
  Filled 2015-09-16: qty 1000

## 2015-09-16 MED ORDER — DIPHENHYDRAMINE HCL 50 MG/ML IJ SOLN
50.0000 mg | Freq: Once | INTRAMUSCULAR | Status: AC
  Administered 2015-09-16: 50 mg via INTRAVENOUS
  Filled 2015-09-16: qty 1

## 2015-09-23 ENCOUNTER — Inpatient Hospital Stay: Payer: No Typology Code available for payment source

## 2015-09-23 ENCOUNTER — Inpatient Hospital Stay (HOSPITAL_BASED_OUTPATIENT_CLINIC_OR_DEPARTMENT_OTHER): Payer: No Typology Code available for payment source | Admitting: Oncology

## 2015-09-23 VITALS — BP 128/76 | HR 80 | Temp 98.1°F | Resp 18

## 2015-09-23 DIAGNOSIS — Z809 Family history of malignant neoplasm, unspecified: Secondary | ICD-10-CM

## 2015-09-23 DIAGNOSIS — C50912 Malignant neoplasm of unspecified site of left female breast: Secondary | ICD-10-CM | POA: Diagnosis not present

## 2015-09-23 DIAGNOSIS — Z79899 Other long term (current) drug therapy: Secondary | ICD-10-CM

## 2015-09-23 DIAGNOSIS — Z9012 Acquired absence of left breast and nipple: Secondary | ICD-10-CM

## 2015-09-23 DIAGNOSIS — F419 Anxiety disorder, unspecified: Secondary | ICD-10-CM

## 2015-09-23 DIAGNOSIS — R531 Weakness: Secondary | ICD-10-CM

## 2015-09-23 DIAGNOSIS — M7989 Other specified soft tissue disorders: Secondary | ICD-10-CM

## 2015-09-23 DIAGNOSIS — K1370 Unspecified lesions of oral mucosa: Secondary | ICD-10-CM

## 2015-09-23 DIAGNOSIS — D649 Anemia, unspecified: Secondary | ICD-10-CM

## 2015-09-23 DIAGNOSIS — Z171 Estrogen receptor negative status [ER-]: Secondary | ICD-10-CM | POA: Diagnosis not present

## 2015-09-23 DIAGNOSIS — R5383 Other fatigue: Secondary | ICD-10-CM

## 2015-09-23 DIAGNOSIS — I10 Essential (primary) hypertension: Secondary | ICD-10-CM

## 2015-09-23 LAB — COMPREHENSIVE METABOLIC PANEL
ALBUMIN: 3.3 g/dL — AB (ref 3.5–5.0)
ALT: 22 U/L (ref 14–54)
AST: 30 U/L (ref 15–41)
Alkaline Phosphatase: 76 U/L (ref 38–126)
Anion gap: 10 (ref 5–15)
BILIRUBIN TOTAL: 0.4 mg/dL (ref 0.3–1.2)
BUN: 13 mg/dL (ref 6–20)
CALCIUM: 8.9 mg/dL (ref 8.9–10.3)
CHLORIDE: 103 mmol/L (ref 101–111)
CO2: 25 mmol/L (ref 22–32)
CREATININE: 0.65 mg/dL (ref 0.44–1.00)
GFR calc Af Amer: >60 mL/min (ref >60)
GLUCOSE: 142 mg/dL — AB (ref 65–99)
Potassium: 3.4 mmol/L — ABNORMAL LOW (ref 3.5–5.1)
Sodium: 138 mmol/L (ref 135–145)
TOTAL PROTEIN: 6.1 g/dL — AB (ref 6.5–8.1)

## 2015-09-23 LAB — CBC WITH DIFFERENTIAL/PLATELET
BASOS ABS: 0.1 10*3/uL (ref 0–0.1)
BASOS PCT: 2 %
Eosinophils Absolute: 0.2 10*3/uL (ref 0–0.7)
Eosinophils Relative: 4 %
HEMATOCRIT: 28.3 % — AB (ref 35.0–47.0)
Hemoglobin: 9.7 g/dL — ABNORMAL LOW (ref 12.0–16.0)
LYMPHS PCT: 13 %
Lymphs Abs: 0.7 10*3/uL — ABNORMAL LOW (ref 1.0–3.6)
MCH: 34.7 pg — ABNORMAL HIGH (ref 26.0–34.0)
MCHC: 34.4 g/dL (ref 32.0–36.0)
MCV: 101.1 fL — AB (ref 80.0–100.0)
Monocytes Absolute: 0.4 10*3/uL (ref 0.2–0.9)
Monocytes Relative: 8 %
NEUTROS ABS: 3.8 10*3/uL (ref 1.4–6.5)
Neutrophils Relative %: 73 %
PLATELETS: 281 10*3/uL (ref 150–440)
RBC: 2.8 MIL/uL — AB (ref 3.80–5.20)
RDW: 15.7 % — ABNORMAL HIGH (ref 11.5–14.5)
WBC: 5.2 10*3/uL (ref 3.6–11.0)

## 2015-09-23 MED ORDER — SODIUM CHLORIDE 0.9 % IV SOLN
Freq: Once | INTRAVENOUS | Status: AC
  Administered 2015-09-23: 10:00:00 via INTRAVENOUS
  Filled 2015-09-23: qty 1000

## 2015-09-23 MED ORDER — DIPHENHYDRAMINE HCL 50 MG/ML IJ SOLN
50.0000 mg | Freq: Once | INTRAMUSCULAR | Status: AC
  Administered 2015-09-23: 50 mg via INTRAVENOUS
  Filled 2015-09-23: qty 1

## 2015-09-23 MED ORDER — SODIUM CHLORIDE 0.9 % IJ SOLN
10.0000 mL | INTRAMUSCULAR | Status: AC | PRN
  Administered 2015-09-23 (×2): 10 mL
  Filled 2015-09-23: qty 10

## 2015-09-23 MED ORDER — SODIUM CHLORIDE 0.9 % IV SOLN
Freq: Once | INTRAVENOUS | Status: AC
  Administered 2015-09-23: 10:00:00 via INTRAVENOUS
  Filled 2015-09-23: qty 4

## 2015-09-23 MED ORDER — FAMOTIDINE IN NACL 20-0.9 MG/50ML-% IV SOLN
20.0000 mg | Freq: Once | INTRAVENOUS | Status: AC
  Administered 2015-09-23: 20 mg via INTRAVENOUS
  Filled 2015-09-23: qty 50

## 2015-09-23 MED ORDER — HEPARIN SOD (PORK) LOCK FLUSH 100 UNIT/ML IV SOLN
500.0000 [IU] | Freq: Once | INTRAVENOUS | Status: AC
  Filled 2015-09-23: qty 5

## 2015-09-23 MED ORDER — PACLITAXEL CHEMO INJECTION 300 MG/50ML
80.0000 mg/m2 | Freq: Once | INTRAVENOUS | Status: AC
  Administered 2015-09-23: 138 mg via INTRAVENOUS
  Filled 2015-09-23: qty 23

## 2015-09-23 MED ORDER — HEPARIN SOD (PORK) LOCK FLUSH 100 UNIT/ML IV SOLN
500.0000 [IU] | Freq: Once | INTRAVENOUS | Status: AC | PRN
  Administered 2015-09-23: 500 [IU]

## 2015-09-23 MED ORDER — SODIUM CHLORIDE 0.9 % IJ SOLN
10.0000 mL | INTRAMUSCULAR | Status: AC | PRN
  Filled 2015-09-23: qty 10

## 2015-09-23 NOTE — Progress Notes (Signed)
Patient is having bilateral leg edema but thinks it could be related to not being able to elevate legs because she is spending a lot of time in the hospital with a family member.  Has a hard time sleeping on treatment days and she has taken Ambien in the past, is asking about receiving Ambien rx today.

## 2015-09-30 ENCOUNTER — Inpatient Hospital Stay (HOSPITAL_BASED_OUTPATIENT_CLINIC_OR_DEPARTMENT_OTHER): Payer: No Typology Code available for payment source | Admitting: Oncology

## 2015-09-30 ENCOUNTER — Inpatient Hospital Stay: Payer: No Typology Code available for payment source

## 2015-09-30 VITALS — BP 126/68 | HR 79 | Temp 98.0°F | Resp 18

## 2015-09-30 VITALS — BP 143/72 | HR 82 | Temp 98.8°F | Resp 18

## 2015-09-30 DIAGNOSIS — Z79899 Other long term (current) drug therapy: Secondary | ICD-10-CM

## 2015-09-30 DIAGNOSIS — F419 Anxiety disorder, unspecified: Secondary | ICD-10-CM | POA: Diagnosis not present

## 2015-09-30 DIAGNOSIS — Z171 Estrogen receptor negative status [ER-]: Secondary | ICD-10-CM

## 2015-09-30 DIAGNOSIS — Z809 Family history of malignant neoplasm, unspecified: Secondary | ICD-10-CM

## 2015-09-30 DIAGNOSIS — I10 Essential (primary) hypertension: Secondary | ICD-10-CM

## 2015-09-30 DIAGNOSIS — D649 Anemia, unspecified: Secondary | ICD-10-CM | POA: Diagnosis not present

## 2015-09-30 DIAGNOSIS — C50912 Malignant neoplasm of unspecified site of left female breast: Secondary | ICD-10-CM

## 2015-09-30 DIAGNOSIS — K1370 Unspecified lesions of oral mucosa: Secondary | ICD-10-CM

## 2015-09-30 DIAGNOSIS — M7989 Other specified soft tissue disorders: Secondary | ICD-10-CM

## 2015-09-30 DIAGNOSIS — R531 Weakness: Secondary | ICD-10-CM

## 2015-09-30 DIAGNOSIS — Z9012 Acquired absence of left breast and nipple: Secondary | ICD-10-CM

## 2015-09-30 DIAGNOSIS — R5383 Other fatigue: Secondary | ICD-10-CM

## 2015-09-30 LAB — CBC WITH DIFFERENTIAL/PLATELET
Basophils Absolute: 0 10*3/uL (ref 0–0.1)
Basophils Relative: 1 %
EOS PCT: 2 %
Eosinophils Absolute: 0.1 10*3/uL (ref 0–0.7)
HCT: 29.2 % — ABNORMAL LOW (ref 35.0–47.0)
HEMOGLOBIN: 9.9 g/dL — AB (ref 12.0–16.0)
LYMPHS ABS: 0.8 10*3/uL — AB (ref 1.0–3.6)
Lymphocytes Relative: 18 %
MCH: 34.6 pg — AB (ref 26.0–34.0)
MCHC: 34 g/dL (ref 32.0–36.0)
MCV: 101.8 fL — AB (ref 80.0–100.0)
Monocytes Absolute: 0.3 10*3/uL (ref 0.2–0.9)
Monocytes Relative: 8 %
Neutro Abs: 3.2 10*3/uL (ref 1.4–6.5)
Neutrophils Relative %: 71 %
Platelets: 326 10*3/uL (ref 150–440)
RBC: 2.87 MIL/uL — AB (ref 3.80–5.20)
RDW: 14.7 % — ABNORMAL HIGH (ref 11.5–14.5)
WBC: 4.5 10*3/uL (ref 3.6–11.0)

## 2015-09-30 LAB — COMPREHENSIVE METABOLIC PANEL
ALBUMIN: 3.5 g/dL (ref 3.5–5.0)
ALK PHOS: 67 U/L (ref 38–126)
ALT: 21 U/L (ref 14–54)
AST: 28 U/L (ref 15–41)
Anion gap: 9 (ref 5–15)
BUN: 13 mg/dL (ref 6–20)
CALCIUM: 8.8 mg/dL — AB (ref 8.9–10.3)
CO2: 25 mmol/L (ref 22–32)
CREATININE: 0.66 mg/dL (ref 0.44–1.00)
Chloride: 103 mmol/L (ref 101–111)
GFR calc Af Amer: >60 mL/min (ref >60)
GFR calc non Af Amer: >60 mL/min (ref >60)
GLUCOSE: 126 mg/dL — AB (ref 65–99)
Potassium: 3.9 mmol/L (ref 3.5–5.1)
SODIUM: 137 mmol/L (ref 135–145)
Total Bilirubin: 0.2 mg/dL — ABNORMAL LOW (ref 0.3–1.2)
Total Protein: 6.6 g/dL (ref 6.5–8.1)

## 2015-09-30 MED ORDER — FAMOTIDINE IN NACL 20-0.9 MG/50ML-% IV SOLN
20.0000 mg | Freq: Once | INTRAVENOUS | Status: AC
  Administered 2015-09-30: 20 mg via INTRAVENOUS
  Filled 2015-09-30: qty 50

## 2015-09-30 MED ORDER — SODIUM CHLORIDE 0.9 % IV SOLN
Freq: Once | INTRAVENOUS | Status: AC
  Administered 2015-09-30: 09:00:00 via INTRAVENOUS
  Filled 2015-09-30: qty 1000

## 2015-09-30 MED ORDER — PACLITAXEL CHEMO INJECTION 300 MG/50ML
80.0000 mg/m2 | Freq: Once | INTRAVENOUS | Status: AC
  Administered 2015-09-30: 138 mg via INTRAVENOUS
  Filled 2015-09-30: qty 23

## 2015-09-30 MED ORDER — SODIUM CHLORIDE 0.9 % IV SOLN
Freq: Once | INTRAVENOUS | Status: AC
  Administered 2015-09-30: 10:00:00 via INTRAVENOUS
  Filled 2015-09-30: qty 4

## 2015-09-30 MED ORDER — SODIUM CHLORIDE 0.9 % IJ SOLN
10.0000 mL | INTRAMUSCULAR | Status: AC | PRN
  Administered 2015-09-30: 10 mL
  Filled 2015-09-30: qty 10

## 2015-09-30 MED ORDER — DIPHENHYDRAMINE HCL 50 MG/ML IJ SOLN
50.0000 mg | Freq: Once | INTRAMUSCULAR | Status: AC
  Administered 2015-09-30: 50 mg via INTRAVENOUS
  Filled 2015-09-30: qty 1

## 2015-09-30 MED ORDER — HEPARIN SOD (PORK) LOCK FLUSH 100 UNIT/ML IV SOLN
500.0000 [IU] | Freq: Once | INTRAVENOUS | Status: AC | PRN
Start: 2015-09-30 — End: ?
  Filled 2015-09-30: qty 5

## 2015-09-30 NOTE — Progress Notes (Signed)
[place]  Telephone:(336) [phone] Fax:(336) [phone]  ID: Brittany Ryan OB: [birth year]  MR#: [id number]  NGE#:[id number]  Patient Care Team: [name], NP as PCP - General (Nurse Practitioner) [name], NP (Nurse Practitioner) Florene Glen, MD (Surgery)  CHIEF COMPLAINT: Breast cancer. No chief complaint on file.   INTERVAL HISTORY: Patient returns to clinic today for further evaluation and consideration of cycle 6 of 12 of weekly Taxol. She has noticed some increased swelling in her legs, but attributes this to being more sedentary caring for a cousin who was recently in a car accident. She otherwise feels well. She has no neurologic complaints. She denies any recent fevers or illnesses. She has a good appetite and denies weight loss. She denies any pain. She has no chest pain or shortness of breath. She denies any nausea, vomiting, constipation, or diarrhea. She has no urinary complaints. Patient offers no further specific complaints today.  REVIEW OF SYSTEMS:   Review of Systems  Constitutional: Positive for malaise/fatigue.  Respiratory: Negative.   Cardiovascular: Negative.   Neurological: Positive for weakness.  Psychiatric/Behavioral: The patient is nervous/anxious.     As per HPI. Otherwise, a complete review of systems is negatve.  PAST MEDICAL HISTORY: Past Medical History  Diagnosis Date  . Breast cancer   . Seasonal allergies   . Frequent sinus infections   . Hypertension     PAST SURGICAL HISTORY: Past Surgical History  Procedure Laterality Date  . Breast biopsy Left 04/27/2015    results unknown  . Mastectomy, partial Left 05/19/2015    Procedure: MASTECTOMY PARTIAL;  Surgeon: Florene Glen, MD;  Location: [facility];  Service: General;  Laterality: Left;  . Breast lumpectomy with needle localization Left 05/19/2015    Procedure: BREAST LUMPECTOMY WITH NEEDLE LOCALIZATION;  Surgeon: Florene Glen, MD;  Location: [facility];   Service: General;  Laterality: Left;  . Breast lumpectomy with sentinel lymph node biopsy Left 05/19/2015    Procedure: BREAST LUMPECTOMY WITH SENTINEL LYMPH NODE BX;  Surgeon: Florene Glen, MD;  Location: [facility];  Service: General;  Laterality: Left;  . Portacath placement Left 05/19/2015    Procedure: INSERTION PORT-A-CATH;  Surgeon: Florene Glen, MD;  Location: [facility];  Service: General;  Laterality: Left;    FAMILY HISTORY Family History  Problem Relation Age of Onset  . Cancer Father   . Hypertension Father   . CAD Father   . Heart attack Father        ADVANCED DIRECTIVES:    HEALTH MAINTENANCE: Social History  Substance Use Topics  . Smoking status: Never Smoker   . Smokeless tobacco: Never Used  . Alcohol Use: Yes     Comment: rare beer     Colonoscopy:  PAP:  Bone density:  Lipid panel:  No Known Allergies  Current Outpatient Prescriptions  Medication Sig Dispense Refill  . acetaminophen (TYLENOL) 500 MG tablet Take 1,000 mg by mouth every 6 (six) hours as needed for mild pain or headache.    . B Complex Vitamins (VITAMIN B COMPLEX PO) Take 1 tablet by mouth daily as needed.    . Calcium Carbonate-Vitamin D (CALCIUM-VITAMIN D) 500-200 MG-UNIT per tablet Take 1 tablet by mouth daily.    . chlorpheniramine-HYDROcodone (TUSSIONEX) 10-8 MG/5ML SUER Take 5 mLs by mouth every 12 (twelve) hours as needed for cough. 140 mL 0  . diphenhydramine-acetaminophen (TYLENOL PM) 25-500 MG TABS Take 1-2 tablets by mouth at bedtime as needed.    Marland Kitchen  fluticasone (FLONASE) 50 MCG/ACT nasal spray Place into the nose.    Marland Kitchen Hydrocodone-Acetaminophen (VICODIN) 5-300 MG TABS Take 1 tablet by mouth every 4 (four) hours as needed. 30 each 0  . lidocaine-prilocaine (EMLA) cream Apply 1 application topically as needed. Apply to port and cover with saran wrap 1-2 hours before chemotherapy 30 g 2  . loratadine (CLARITIN) 10 MG tablet Take by mouth.    . magic mouthwash SOLN Take 5 mLs  by mouth 4 (four) times daily as needed for mouth pain. 480 mL 1  . ondansetron (ZOFRAN-ODT) 4 MG disintegrating tablet Take 1 tablet (4 mg total) by mouth every 8 (eight) hours as needed for nausea or vomiting. 20 tablet 0  . polyethylene glycol powder (GLYCOLAX/MIRALAX) powder Take 1 Container by mouth once.    . prochlorperazine (COMPAZINE) 10 MG tablet Take 1 tablet (10 mg total) by mouth every 6 (six) hours as needed for nausea or vomiting. 60 tablet 0  . senna (SENOKOT) 8.6 MG TABS tablet Take 1 tablet by mouth.     No current facility-administered medications for this visit.   Facility-Administered Medications Ordered in Other Visits  Medication Dose Route Frequency Provider Last Rate Last Dose  . 0.9 %  sodium chloride infusion   Intravenous Once Lloyd Huger, MD      . heparin lock flush 100 unit/mL  500 Units Intravenous Once Lloyd Huger, MD      . heparin lock flush 100 unit/mL  500 Units Intravenous Once Lloyd Huger, MD      . heparin lock flush 100 unit/mL  500 Units Intracatheter Once PRN Lloyd Huger, MD      . ondansetron (ZOFRAN) 8 mg, dexamethasone (DECADRON) 4 mg in sodium chloride 0.9 % 50 mL IVPB   Intravenous Once Lloyd Huger, MD      . PACLitaxel (TAXOL) 138 mg in dextrose 5 % 250 mL chemo infusion (</= 62m/m2)  80 mg/m2 (Order-Specific) Intravenous Once TLloyd Huger MD      . sodium chloride 0.9 % injection 10 mL  10 mL Intracatheter PRN TLloyd Huger MD   10 mL at 07/22/15 0956  . sodium chloride 0.9 % injection 10 mL  10 mL Intravenous PRN TLloyd Huger MD   10 mL at 09/02/15 0941  . sodium chloride 0.9 % injection 10 mL  10 mL Intravenous PRN TLloyd Huger MD      . sodium chloride 0.9 % injection 10 mL  10 mL Intracatheter PRN TLloyd Huger MD   10 mL at 09/23/15 1205  . sodium chloride 0.9 % injection 10 mL  10 mL Intracatheter PRN TLloyd Huger MD        OBJECTIVE: There were no vitals filed  for this visit.   There is no weight on file to calculate BMI.    ECOG FS:0 - Asymptomatic  General: Well-developed, well-nourished, no acute distress. Eyes: anicteric sclera. Breasts: Exam deferred today. Lungs: Clear to auscultation bilaterally. Heart: Regular rate and rhythm. No rubs, murmurs, or gallops. Abdomen: Soft, nontender, nondistended. No organomegaly noted, normoactive bowel sounds. Musculoskeletal: No edema, cyanosis, or clubbing. Neuro: Alert, answering all questions appropriately. Cranial nerves grossly intact. Skin: No rashes or petechiae noted. Psych: Normal affect.   LAB RESULTS:  Lab Results  Component Value Date   NA 137 09/30/2015   K 3.9 09/30/2015   CL 103 09/30/2015   CO2 25 09/30/2015   GLUCOSE 126* 09/30/2015  BUN 13 09/30/2015   CREATININE 0.66 09/30/2015   CALCIUM 8.8* 09/30/2015   PROT 6.6 09/30/2015   ALBUMIN 3.5 09/30/2015   AST 28 09/30/2015   ALT 21 09/30/2015   ALKPHOS 67 09/30/2015   BILITOT 0.2* 09/30/2015   GFRNONAA >60 09/30/2015   GFRAA >60 09/30/2015    Lab Results  Component Value Date   WBC 4.5 09/30/2015   NEUTROABS 3.2 09/30/2015   HGB 9.9* 09/30/2015   HCT 29.2* 09/30/2015   MCV 101.8* 09/30/2015   PLT 326 09/30/2015     STUDIES: No results found.  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  PLAN:    1. Breast cancer: Patient has now completed 4 cycles of dose dense Adriamycin plus Cytoxan. Proceed with cycle 6 of 12 of weekly Taxol. At the conclusion of her chemotherapy, patient will require adjuvant XRT. An aromatase inhibitor would not offer benefit given the triple negative status of her disease. Return to clinic in 1 week for consideration of cycle 7 of 12.  2. Anxiety: Improving, monitor.  3. Anemia: Patient's hemoglobin is essentially stable.  Secondary to chemotherapy, monitor. 4. Mouth ulcerations: Continue medicated mouthwash and baking soda  with water. 5. Peripheral edema: No intervention needed at this time, monitor.  Patient expressed understanding and was in agreement with this plan. She also understands that She can call clinic at any time with any questions, concerns, or complaints.   Breast cancer   Staging form: Breast, AJCC 7th Edition     Clinical stage from 06/04/2015: Stage IA (T1c, N0, M0) - Signed by Lloyd Huger, MD on 06/04/2015   Lloyd Huger, MD   09/30/2015 9:57 AM

## 2015-09-30 NOTE — Progress Notes (Signed)
[place]  Telephone:([phone] Fax:(336) [phone]  ID: Brittany Ryan OB: Sep 21, 1961  MR#: [id number]  QBH#:[id number]  Patient Care Team: [name], NP as PCP - General (Nurse Practitioner) [name], NP (Nurse Practitioner) Florene Glen, MD (Surgery)  CHIEF COMPLAINT: Breast cancer. No chief complaint on file.   INTERVAL HISTORY: Patient returns to clinic today for further evaluation and consideration of cycle 7 of 12 of weekly Taxol. Her peripheral edema has an improved. Because it is also improving. She does not complain of any peripheral neuropathy. She has no neurologic complaints. She denies any recent fevers or illnesses. She has a good appetite and denies weight loss. She denies any pain. She has no chest pain or shortness of breath. She denies any nausea, vomiting, constipation, or diarrhea. She has no urinary complaints. Patient offers no further specific complaints today.  REVIEW OF SYSTEMS:   Review of Systems  Constitutional: Positive for malaise/fatigue.  Respiratory: Negative.   Cardiovascular: Negative.   Neurological: Positive for weakness.  Psychiatric/Behavioral: The patient is nervous/anxious.     As per HPI. Otherwise, a complete review of systems is negatve.  PAST MEDICAL HISTORY: Past Medical History  Diagnosis Date  . Breast cancer   . Seasonal allergies   . Frequent sinus infections   . Hypertension     PAST SURGICAL HISTORY: Past Surgical History  Procedure Laterality Date  . Breast biopsy Left 04/27/2015    results unknown  . Mastectomy, partial Left 05/19/2015    Procedure: MASTECTOMY PARTIAL;  Surgeon: Florene Glen, MD;  Location: [facility];  Service: General;  Laterality: Left;  . Breast lumpectomy with needle localization Left 05/19/2015    Procedure: BREAST LUMPECTOMY WITH NEEDLE LOCALIZATION;  Surgeon: Florene Glen, MD;  Location: [facility];  Service: General;  Laterality: Left;  . Breast lumpectomy  with sentinel lymph node biopsy Left 05/19/2015    Procedure: BREAST LUMPECTOMY WITH SENTINEL LYMPH NODE BX;  Surgeon: Florene Glen, MD;  Location: [facility];  Service: General;  Laterality: Left;  . Portacath placement Left 05/19/2015    Procedure: INSERTION PORT-A-CATH;  Surgeon: Florene Glen, MD;  Location: [facility];  Service: General;  Laterality: Left;    FAMILY HISTORY Family History  Problem Relation Age of Onset  . Cancer Father   . Hypertension Father   . CAD Father   . Heart attack Father        ADVANCED DIRECTIVES:    HEALTH MAINTENANCE: Social History  Substance Use Topics  . Smoking status: Never Smoker   . Smokeless tobacco: Never Used  . Alcohol Use: Yes     Comment: rare beer     Colonoscopy:  PAP:  Bone density:  Lipid panel:  No Known Allergies  Current Outpatient Prescriptions  Medication Sig Dispense Refill  . acetaminophen (TYLENOL) 500 MG tablet Take 1,000 mg by mouth every 6 (six) hours as needed for mild pain or headache.    . B Complex Vitamins (VITAMIN B COMPLEX PO) Take 1 tablet by mouth daily as needed.    . Calcium Carbonate-Vitamin D (CALCIUM-VITAMIN D) 500-200 MG-UNIT per tablet Take 1 tablet by mouth daily.    . chlorpheniramine-HYDROcodone (TUSSIONEX) 10-8 MG/5ML SUER Take 5 mLs by mouth every 12 (twelve) hours as needed for cough. 140 mL 0  . diphenhydramine-acetaminophen (TYLENOL PM) 25-500 MG TABS Take 1-2 tablets by mouth at bedtime as needed.    . fluticasone (FLONASE) 50 MCG/ACT nasal spray Place into the  nose.    . Hydrocodone-Acetaminophen (VICODIN) 5-300 MG TABS Take 1 tablet by mouth every 4 (four) hours as needed. 30 each 0  . lidocaine-prilocaine (EMLA) cream Apply 1 application topically as needed. Apply to port and cover with saran wrap 1-2 hours before chemotherapy 30 g 2  . loratadine (CLARITIN) 10 MG tablet Take by mouth.    . magic mouthwash SOLN Take 5 mLs by mouth 4 (four) times daily as needed for mouth pain. 480  mL 1  . ondansetron (ZOFRAN-ODT) 4 MG disintegrating tablet Take 1 tablet (4 mg total) by mouth every 8 (eight) hours as needed for nausea or vomiting. 20 tablet 0  . polyethylene glycol powder (GLYCOLAX/MIRALAX) powder Take 1 Container by mouth once.    . prochlorperazine (COMPAZINE) 10 MG tablet Take 1 tablet (10 mg total) by mouth every 6 (six) hours as needed for nausea or vomiting. 60 tablet 0  . senna (SENOKOT) 8.6 MG TABS tablet Take 1 tablet by mouth.     No current facility-administered medications for this visit.   Facility-Administered Medications Ordered in Other Visits  Medication Dose Route Frequency Provider Last Rate Last Dose  . 0.9 %  sodium chloride infusion   Intravenous Once Lloyd Huger, MD      . heparin lock flush 100 unit/mL  500 Units Intravenous Once Lloyd Huger, MD      . heparin lock flush 100 unit/mL  500 Units Intravenous Once Lloyd Huger, MD      . heparin lock flush 100 unit/mL  500 Units Intracatheter Once PRN Lloyd Huger, MD      . ondansetron (ZOFRAN) 8 mg, dexamethasone (DECADRON) 4 mg in sodium chloride 0.9 % 50 mL IVPB   Intravenous Once Lloyd Huger, MD      . PACLitaxel (TAXOL) 138 mg in dextrose 5 % 250 mL chemo infusion (</= 56m/m2)  80 mg/m2 (Order-Specific) Intravenous Once TLloyd Huger MD      . sodium chloride 0.9 % injection 10 mL  10 mL Intracatheter PRN TLloyd Huger MD   10 mL at 07/22/15 0956  . sodium chloride 0.9 % injection 10 mL  10 mL Intravenous PRN TLloyd Huger MD   10 mL at 09/02/15 0941  . sodium chloride 0.9 % injection 10 mL  10 mL Intravenous PRN TLloyd Huger MD      . sodium chloride 0.9 % injection 10 mL  10 mL Intracatheter PRN TLloyd Huger MD   10 mL at 09/23/15 1205  . sodium chloride 0.9 % injection 10 mL  10 mL Intracatheter PRN TLloyd Huger MD        OBJECTIVE: There were no vitals filed for this visit.   There is no weight on file to calculate  BMI.    ECOG FS:0 - Asymptomatic  General: Well-developed, well-nourished, no acute distress. Eyes: anicteric sclera. Breasts: Exam deferred today. Lungs: Clear to auscultation bilaterally. Heart: Regular rate and rhythm. No rubs, murmurs, or gallops. Abdomen: Soft, nontender, nondistended. No organomegaly noted, normoactive bowel sounds. Musculoskeletal: No edema, cyanosis, or clubbing. Neuro: Alert, answering all questions appropriately. Cranial nerves grossly intact. Skin: No rashes or petechiae noted. Psych: Normal affect.   LAB RESULTS:  Lab Results  Component Value Date   NA 137 09/30/2015   K 3.9 09/30/2015   CL 103 09/30/2015   CO2 25 09/30/2015   GLUCOSE 126* 09/30/2015   BUN 13 09/30/2015   CREATININE 0.66 09/30/2015  CALCIUM 8.8* 09/30/2015   PROT 6.6 09/30/2015   ALBUMIN 3.5 09/30/2015   AST 28 09/30/2015   ALT 21 09/30/2015   ALKPHOS 67 09/30/2015   BILITOT 0.2* 09/30/2015   GFRNONAA >60 09/30/2015   GFRAA >60 09/30/2015    Lab Results  Component Value Date   WBC 4.5 09/30/2015   NEUTROABS 3.2 09/30/2015   HGB 9.9* 09/30/2015   HCT 29.2* 09/30/2015   MCV 101.8* 09/30/2015   PLT 326 09/30/2015     STUDIES: No results found.  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  PLAN:    1. Breast cancer: Patient has now completed 4 cycles of dose dense Adriamycin plus Cytoxan. Proceed with cycle 7 of 12 of weekly Taxol. At the conclusion of her chemotherapy, patient will require adjuvant XRT. An aromatase inhibitor would not offer benefit given the triple negative status of her disease. Return to clinic in 1 week for evaluation and consideration of cycle 8.  2. Anxiety: Improving, monitor.  3. Anemia: Patient's hemoglobin is essentially stable.  Secondary to chemotherapy, monitor. 4. Mouth ulcerations: Continue medicated mouthwash and baking soda with water. 5. Peripheral edema:  Resolved.  Patient expressed understanding and was in agreement with this plan. She also understands that She can call clinic at any time with any questions, concerns, or complaints.   Breast cancer   Staging form: Breast, AJCC 7th Edition     Clinical stage from 06/04/2015: Stage IA (T1c, N0, M0) - Signed by Lloyd Huger, MD on 06/04/2015   Lloyd Huger, MD   09/30/2015 9:59 AM

## 2015-10-01 ENCOUNTER — Encounter: Payer: Self-pay | Admitting: Oncology

## 2015-10-07 ENCOUNTER — Inpatient Hospital Stay (HOSPITAL_BASED_OUTPATIENT_CLINIC_OR_DEPARTMENT_OTHER): Payer: No Typology Code available for payment source | Admitting: Oncology

## 2015-10-07 ENCOUNTER — Inpatient Hospital Stay: Payer: No Typology Code available for payment source | Attending: Oncology

## 2015-10-07 ENCOUNTER — Inpatient Hospital Stay: Payer: No Typology Code available for payment source

## 2015-10-07 VITALS — BP 124/66 | HR 72 | Temp 87.2°F

## 2015-10-07 DIAGNOSIS — K121 Other forms of stomatitis: Secondary | ICD-10-CM | POA: Insufficient documentation

## 2015-10-07 DIAGNOSIS — R531 Weakness: Secondary | ICD-10-CM

## 2015-10-07 DIAGNOSIS — Z809 Family history of malignant neoplasm, unspecified: Secondary | ICD-10-CM | POA: Diagnosis not present

## 2015-10-07 DIAGNOSIS — Z7982 Long term (current) use of aspirin: Secondary | ICD-10-CM | POA: Diagnosis not present

## 2015-10-07 DIAGNOSIS — G4700 Insomnia, unspecified: Secondary | ICD-10-CM

## 2015-10-07 DIAGNOSIS — Z171 Estrogen receptor negative status [ER-]: Secondary | ICD-10-CM | POA: Insufficient documentation

## 2015-10-07 DIAGNOSIS — R5383 Other fatigue: Secondary | ICD-10-CM | POA: Insufficient documentation

## 2015-10-07 DIAGNOSIS — G629 Polyneuropathy, unspecified: Secondary | ICD-10-CM | POA: Insufficient documentation

## 2015-10-07 DIAGNOSIS — F419 Anxiety disorder, unspecified: Secondary | ICD-10-CM | POA: Insufficient documentation

## 2015-10-07 DIAGNOSIS — C50912 Malignant neoplasm of unspecified site of left female breast: Secondary | ICD-10-CM | POA: Insufficient documentation

## 2015-10-07 DIAGNOSIS — Z5111 Encounter for antineoplastic chemotherapy: Secondary | ICD-10-CM | POA: Diagnosis not present

## 2015-10-07 DIAGNOSIS — I10 Essential (primary) hypertension: Secondary | ICD-10-CM | POA: Diagnosis not present

## 2015-10-07 DIAGNOSIS — D6481 Anemia due to antineoplastic chemotherapy: Secondary | ICD-10-CM | POA: Diagnosis not present

## 2015-10-07 DIAGNOSIS — R609 Edema, unspecified: Secondary | ICD-10-CM | POA: Diagnosis not present

## 2015-10-07 DIAGNOSIS — Z79899 Other long term (current) drug therapy: Secondary | ICD-10-CM | POA: Diagnosis not present

## 2015-10-07 LAB — CBC WITH DIFFERENTIAL/PLATELET
BASOS PCT: 1 %
Basophils Absolute: 0 10*3/uL (ref 0–0.1)
EOS ABS: 0.2 10*3/uL (ref 0–0.7)
EOS PCT: 3 %
HCT: 31.7 % — ABNORMAL LOW (ref 35.0–47.0)
HEMOGLOBIN: 10.8 g/dL — AB (ref 12.0–16.0)
Lymphocytes Relative: 21 %
Lymphs Abs: 0.9 10*3/uL — ABNORMAL LOW (ref 1.0–3.6)
MCH: 34.5 pg — ABNORMAL HIGH (ref 26.0–34.0)
MCHC: 34 g/dL (ref 32.0–36.0)
MCV: 101.4 fL — ABNORMAL HIGH (ref 80.0–100.0)
MONOS PCT: 10 %
Monocytes Absolute: 0.4 10*3/uL (ref 0.2–0.9)
NEUTROS PCT: 65 %
Neutro Abs: 2.9 10*3/uL (ref 1.4–6.5)
PLATELETS: 352 10*3/uL (ref 150–440)
RBC: 3.13 MIL/uL — ABNORMAL LOW (ref 3.80–5.20)
RDW: 15.2 % — AB (ref 11.5–14.5)
WBC: 4.5 10*3/uL (ref 3.6–11.0)

## 2015-10-07 LAB — COMPREHENSIVE METABOLIC PANEL
ALT: 20 U/L (ref 14–54)
AST: 28 U/L (ref 15–41)
Albumin: 3.7 g/dL (ref 3.5–5.0)
Alkaline Phosphatase: 62 U/L (ref 38–126)
Anion gap: 8 (ref 5–15)
BUN: 14 mg/dL (ref 6–20)
CHLORIDE: 102 mmol/L (ref 101–111)
CO2: 27 mmol/L (ref 22–32)
CREATININE: 0.97 mg/dL (ref 0.44–1.00)
Calcium: 9 mg/dL (ref 8.9–10.3)
Glucose, Bld: 111 mg/dL — ABNORMAL HIGH (ref 65–99)
POTASSIUM: 4.1 mmol/L (ref 3.5–5.1)
Sodium: 137 mmol/L (ref 135–145)
TOTAL PROTEIN: 6.5 g/dL (ref 6.5–8.1)
Total Bilirubin: 0.7 mg/dL (ref 0.3–1.2)

## 2015-10-07 MED ORDER — PACLITAXEL CHEMO INJECTION 300 MG/50ML
80.0000 mg/m2 | Freq: Once | INTRAVENOUS | Status: AC
  Administered 2015-10-07: 138 mg via INTRAVENOUS
  Filled 2015-10-07: qty 23

## 2015-10-07 MED ORDER — INFLUENZA VAC SPLIT QUAD 0.5 ML IM SUSY
0.5000 mL | PREFILLED_SYRINGE | Freq: Once | INTRAMUSCULAR | Status: AC
  Administered 2015-10-07: 0.5 mL via INTRAMUSCULAR
  Filled 2015-10-07: qty 0.5

## 2015-10-07 MED ORDER — HEPARIN SOD (PORK) LOCK FLUSH 100 UNIT/ML IV SOLN
500.0000 [IU] | Freq: Once | INTRAVENOUS | Status: AC | PRN
  Administered 2015-10-07: 500 [IU]
  Filled 2015-10-07: qty 5

## 2015-10-07 MED ORDER — SODIUM CHLORIDE 0.9 % IJ SOLN
10.0000 mL | INTRAMUSCULAR | Status: DC | PRN
  Administered 2015-10-07: 10 mL
  Filled 2015-10-07: qty 10

## 2015-10-07 MED ORDER — ZOLPIDEM TARTRATE 5 MG PO TABS: 5.0000 mg | ORAL_TABLET | Freq: Every evening | ORAL | Status: DC | PRN

## 2015-10-07 MED ORDER — SODIUM CHLORIDE 0.9 % IV SOLN
Freq: Once | INTRAVENOUS | Status: AC
  Administered 2015-10-07: 11:00:00 via INTRAVENOUS
  Filled 2015-10-07: qty 1000

## 2015-10-07 MED ORDER — SODIUM CHLORIDE 0.9 % IV SOLN
Freq: Once | INTRAVENOUS | Status: AC
  Administered 2015-10-07: 11:00:00 via INTRAVENOUS
  Filled 2015-10-07: qty 4

## 2015-10-07 MED ORDER — DIPHENHYDRAMINE HCL 50 MG/ML IJ SOLN
50.0000 mg | Freq: Once | INTRAMUSCULAR | Status: AC
  Administered 2015-10-07: 50 mg via INTRAVENOUS
  Filled 2015-10-07: qty 1

## 2015-10-07 MED ORDER — FAMOTIDINE IN NACL 20-0.9 MG/50ML-% IV SOLN
20.0000 mg | Freq: Once | INTRAVENOUS | Status: AC
  Administered 2015-10-07: 20 mg via INTRAVENOUS
  Filled 2015-10-07: qty 50

## 2015-10-07 NOTE — Progress Notes (Signed)
Patient is having trouble sleeping at night and has tried OTC meds with no help.

## 2015-10-14 ENCOUNTER — Inpatient Hospital Stay (HOSPITAL_BASED_OUTPATIENT_CLINIC_OR_DEPARTMENT_OTHER): Payer: No Typology Code available for payment source | Admitting: Oncology

## 2015-10-14 ENCOUNTER — Inpatient Hospital Stay: Payer: No Typology Code available for payment source

## 2015-10-14 VITALS — BP 150/80 | HR 80 | Temp 97.8°F | Resp 18 | Wt 218.0 lb

## 2015-10-14 DIAGNOSIS — C50912 Malignant neoplasm of unspecified site of left female breast: Secondary | ICD-10-CM

## 2015-10-14 DIAGNOSIS — R5383 Other fatigue: Secondary | ICD-10-CM

## 2015-10-14 DIAGNOSIS — D6481 Anemia due to antineoplastic chemotherapy: Secondary | ICD-10-CM

## 2015-10-14 DIAGNOSIS — R531 Weakness: Secondary | ICD-10-CM

## 2015-10-14 DIAGNOSIS — G4700 Insomnia, unspecified: Secondary | ICD-10-CM

## 2015-10-14 DIAGNOSIS — Z809 Family history of malignant neoplasm, unspecified: Secondary | ICD-10-CM

## 2015-10-14 DIAGNOSIS — Z171 Estrogen receptor negative status [ER-]: Secondary | ICD-10-CM

## 2015-10-14 DIAGNOSIS — R609 Edema, unspecified: Secondary | ICD-10-CM

## 2015-10-14 DIAGNOSIS — F419 Anxiety disorder, unspecified: Secondary | ICD-10-CM

## 2015-10-14 DIAGNOSIS — Z79899 Other long term (current) drug therapy: Secondary | ICD-10-CM

## 2015-10-14 DIAGNOSIS — Z7982 Long term (current) use of aspirin: Secondary | ICD-10-CM

## 2015-10-14 DIAGNOSIS — Z95828 Presence of other vascular implants and grafts: Secondary | ICD-10-CM

## 2015-10-14 DIAGNOSIS — K121 Other forms of stomatitis: Secondary | ICD-10-CM

## 2015-10-14 DIAGNOSIS — G629 Polyneuropathy, unspecified: Secondary | ICD-10-CM

## 2015-10-14 DIAGNOSIS — I10 Essential (primary) hypertension: Secondary | ICD-10-CM

## 2015-10-14 LAB — CBC WITH DIFFERENTIAL/PLATELET
BASOS ABS: 0.1 10*3/uL (ref 0–0.1)
Basophils Relative: 1 %
EOS PCT: 2 %
Eosinophils Absolute: 0.1 10*3/uL (ref 0–0.7)
HEMATOCRIT: 32.3 % — AB (ref 35.0–47.0)
Hemoglobin: 11 g/dL — ABNORMAL LOW (ref 12.0–16.0)
LYMPHS PCT: 23 %
Lymphs Abs: 1 10*3/uL (ref 1.0–3.6)
MCH: 34.7 pg — ABNORMAL HIGH (ref 26.0–34.0)
MCHC: 34 g/dL (ref 32.0–36.0)
MCV: 102 fL — AB (ref 80.0–100.0)
MONO ABS: 0.4 10*3/uL (ref 0.2–0.9)
MONOS PCT: 9 %
NEUTROS ABS: 3 10*3/uL (ref 1.4–6.5)
Neutrophils Relative %: 65 %
PLATELETS: 316 10*3/uL (ref 150–440)
RBC: 3.17 MIL/uL — ABNORMAL LOW (ref 3.80–5.20)
RDW: 14.4 % (ref 11.5–14.5)
WBC: 4.6 10*3/uL (ref 3.6–11.0)

## 2015-10-14 LAB — COMPREHENSIVE METABOLIC PANEL
ALBUMIN: 3.6 g/dL (ref 3.5–5.0)
ALK PHOS: 71 U/L (ref 38–126)
ALT: 22 U/L (ref 14–54)
ANION GAP: 10 (ref 5–15)
AST: 28 U/L (ref 15–41)
BILIRUBIN TOTAL: 0.2 mg/dL — AB (ref 0.3–1.2)
BUN: 14 mg/dL (ref 6–20)
CALCIUM: 9.1 mg/dL (ref 8.9–10.3)
CO2: 26 mmol/L (ref 22–32)
Chloride: 101 mmol/L (ref 101–111)
Creatinine, Ser: 0.63 mg/dL (ref 0.44–1.00)
GFR calc Af Amer: >60 mL/min (ref >60)
GFR calc non Af Amer: >60 mL/min (ref >60)
GLUCOSE: 108 mg/dL — AB (ref 65–99)
POTASSIUM: 3.7 mmol/L (ref 3.5–5.1)
SODIUM: 137 mmol/L (ref 135–145)
TOTAL PROTEIN: 6.6 g/dL (ref 6.5–8.1)

## 2015-10-14 MED ORDER — HEPARIN SOD (PORK) LOCK FLUSH 100 UNIT/ML IV SOLN
500.0000 [IU] | Freq: Once | INTRAVENOUS | Status: AC
  Administered 2015-10-14: 500 [IU] via INTRAVENOUS
  Filled 2015-10-14: qty 5

## 2015-10-14 MED ORDER — SODIUM CHLORIDE 0.9 % IV SOLN
Freq: Once | INTRAVENOUS | Status: AC
  Administered 2015-10-14: 10:00:00 via INTRAVENOUS
  Filled 2015-10-14: qty 1000

## 2015-10-14 MED ORDER — SODIUM CHLORIDE 0.9 % IV SOLN
Freq: Once | INTRAVENOUS | Status: AC
  Administered 2015-10-14: 10:00:00 via INTRAVENOUS
  Filled 2015-10-14: qty 4

## 2015-10-14 MED ORDER — FAMOTIDINE IN NACL 20-0.9 MG/50ML-% IV SOLN
20.0000 mg | Freq: Once | INTRAVENOUS | Status: AC
  Administered 2015-10-14: 20 mg via INTRAVENOUS
  Filled 2015-10-14: qty 50

## 2015-10-14 MED ORDER — SODIUM CHLORIDE 0.9 % IJ SOLN
10.0000 mL | INTRAMUSCULAR | Status: DC | PRN
  Administered 2015-10-14: 10 mL via INTRAVENOUS
  Filled 2015-10-14: qty 10

## 2015-10-14 MED ORDER — PACLITAXEL CHEMO INJECTION 300 MG/50ML
80.0000 mg/m2 | Freq: Once | INTRAVENOUS | Status: AC
  Administered 2015-10-14: 138 mg via INTRAVENOUS
  Filled 2015-10-14: qty 23

## 2015-10-14 MED ORDER — DIPHENHYDRAMINE HCL 50 MG/ML IJ SOLN
50.0000 mg | Freq: Once | INTRAMUSCULAR | Status: AC
  Administered 2015-10-14: 50 mg via INTRAVENOUS
  Filled 2015-10-14: qty 1

## 2015-10-14 NOTE — Progress Notes (Signed)
[place]  [phone] Fax:(336) [phone]  ID: Brittany Ryan OB: Jun 03, 1961  MR#: [id number]  ZNB#:[id number]  Patient Care Team: [name], NP as PCP - General (Nurse Practitioner) [name], NP (Nurse Practitioner) Florene Glen, MD (Surgery)  CHIEF COMPLAINT: Breast cancer. Chief Complaint  Patient presents with  . Breast Cancer    INTERVAL HISTORY: Patient returns to clinic today for further evaluation and consideration of cycle 8 of 12 of weekly Taxol. She continues to have occasional difficulty sleeping. Her peripheral edema has an improved.  She does not complain of any peripheral neuropathy. She has no neurologic complaints. She denies any recent fevers or illnesses. She has a good appetite and denies weight loss. She denies any pain. She has no chest pain or shortness of breath. She denies any nausea, vomiting, constipation, or diarrhea. She has no urinary complaints. Patient offers no further specific complaints today.  REVIEW OF SYSTEMS:   Review of Systems  Constitutional: Positive for malaise/fatigue.  Respiratory: Negative.   Cardiovascular: Negative.   Neurological: Positive for weakness.  Psychiatric/Behavioral: The patient is nervous/anxious and has insomnia.     As per HPI. Otherwise, a complete review of systems is negatve.  PAST MEDICAL HISTORY: Past Medical History  Diagnosis Date  . Breast cancer   . Seasonal allergies   . Frequent sinus infections   . Hypertension     PAST SURGICAL HISTORY: Past Surgical History  Procedure Laterality Date  . Breast biopsy Left 04/27/2015    results unknown  . Mastectomy, partial Left 05/19/2015    Procedure: MASTECTOMY PARTIAL;  Surgeon: Florene Glen, MD;  Location: [facility];  Service: General;  Laterality: Left;  . Breast lumpectomy with needle localization Left 05/19/2015    Procedure: BREAST LUMPECTOMY WITH NEEDLE LOCALIZATION;  Surgeon: Florene Glen, MD;   Location: [facility];  Service: General;  Laterality: Left;  . Breast lumpectomy with sentinel lymph node biopsy Left 05/19/2015    Procedure: BREAST LUMPECTOMY WITH SENTINEL LYMPH NODE BX;  Surgeon: Florene Glen, MD;  Location: [facility];  Service: General;  Laterality: Left;  . Portacath placement Left 05/19/2015    Procedure: INSERTION PORT-A-CATH;  Surgeon: Florene Glen, MD;  Location: [facility];  Service: General;  Laterality: Left;    FAMILY HISTORY Family History  Problem Relation Age of Onset  . Cancer Father   . Hypertension Father   . CAD Father   . Heart attack Father        ADVANCED DIRECTIVES:    HEALTH MAINTENANCE: Social History  Substance Use Topics  . Smoking status: Never Smoker   . Smokeless tobacco: Never Used  . Alcohol Use: Yes     Comment: rare beer     Colonoscopy:  PAP:  Bone density:  Lipid panel:  No Known Allergies  Current Outpatient Prescriptions  Medication Sig Dispense Refill  . acetaminophen (TYLENOL) 500 MG tablet Take 1,000 mg by mouth every 6 (six) hours as needed for mild pain or headache.    . B Complex Vitamins (VITAMIN B COMPLEX PO) Take 1 tablet by mouth daily as needed.    . Biotin 10 MG CAPS Take by mouth.    . Calcium Carbonate-Vitamin D (CALCIUM-VITAMIN D) 500-200 MG-UNIT per tablet Take 1 tablet by mouth daily.    . chlorpheniramine-HYDROcodone (TUSSIONEX) 10-8 MG/5ML SUER Take 5 mLs by mouth every 12 (twelve) hours as needed for cough. 140 mL 0  . fluticasone (FLONASE) 50 MCG/ACT nasal  spray Place into the nose.    . lidocaine-prilocaine (EMLA) cream Apply 1 application topically as needed. Apply to port and cover with saran wrap 1-2 hours before chemotherapy 30 g 2  . loratadine (CLARITIN) 10 MG tablet Take by mouth.    . magic mouthwash SOLN Take 5 mLs by mouth 4 (four) times daily as needed for mouth pain. 480 mL 1  . Multiple Vitamin (MULTIVITAMIN) tablet Take 1 tablet by mouth daily.    . ondansetron (ZOFRAN-ODT) 4  MG disintegrating tablet Take 1 tablet (4 mg total) by mouth every 8 (eight) hours as needed for nausea or vomiting. 20 tablet 0  . polyethylene glycol powder (GLYCOLAX/MIRALAX) powder Take 1 Container by mouth once.    . prochlorperazine (COMPAZINE) 10 MG tablet Take 1 tablet (10 mg total) by mouth every 6 (six) hours as needed for nausea or vomiting. 60 tablet 0  . senna (SENOKOT) 8.6 MG TABS tablet Take 1 tablet by mouth.    . zolpidem (AMBIEN) 5 MG tablet Take 1 tablet (5 mg total) by mouth at bedtime as needed for sleep. 30 tablet 0   Current Facility-Administered Medications  Medication Dose Route Frequency Provider Last Rate Last Dose  . sodium chloride 0.9 % injection 10 mL  10 mL Intracatheter PRN Lloyd Huger, MD   10 mL at 10/07/15 0900   Facility-Administered Medications Ordered in Other Visits  Medication Dose Route Frequency Provider Last Rate Last Dose  . heparin lock flush 100 unit/mL  500 Units Intravenous Once Lloyd Huger, MD      . heparin lock flush 100 unit/mL  500 Units Intravenous Once Lloyd Huger, MD      . heparin lock flush 100 unit/mL  500 Units Intracatheter Once PRN Lloyd Huger, MD      . sodium chloride 0.9 % injection 10 mL  10 mL Intracatheter PRN Lloyd Huger, MD   10 mL at 07/22/15 0956  . sodium chloride 0.9 % injection 10 mL  10 mL Intravenous PRN Lloyd Huger, MD   10 mL at 09/02/15 0941  . sodium chloride 0.9 % injection 10 mL  10 mL Intravenous PRN Lloyd Huger, MD      . sodium chloride 0.9 % injection 10 mL  10 mL Intracatheter PRN Lloyd Huger, MD   10 mL at 09/23/15 1205  . sodium chloride 0.9 % injection 10 mL  10 mL Intracatheter PRN Lloyd Huger, MD   10 mL at 09/30/15 0900    OBJECTIVE: There were no vitals filed for this visit.   There is no weight on file to calculate BMI.    ECOG FS:0 - Asymptomatic  General: Well-developed, well-nourished, no acute distress. Eyes: anicteric  sclera. Breasts: Exam deferred today. Lungs: Clear to auscultation bilaterally. Heart: Regular rate and rhythm. No rubs, murmurs, or gallops. Abdomen: Soft, nontender, nondistended. No organomegaly noted, normoactive bowel sounds. Musculoskeletal: No edema, cyanosis, or clubbing. Neuro: Alert, answering all questions appropriately. Cranial nerves grossly intact. Skin: No rashes or petechiae noted. Psych: Normal affect.   LAB RESULTS:  Lab Results  Component Value Date   NA 137 10/07/2015   K 4.1 10/07/2015   CL 102 10/07/2015   CO2 27 10/07/2015   GLUCOSE 111* 10/07/2015   BUN 14 10/07/2015   CREATININE 0.97 10/07/2015   CALCIUM 9.0 10/07/2015   PROT 6.5 10/07/2015   ALBUMIN 3.7 10/07/2015   AST 28 10/07/2015   ALT 20 10/07/2015  ALKPHOS 62 10/07/2015   BILITOT 0.7 10/07/2015   GFRNONAA >60 10/07/2015   GFRAA >60 10/07/2015    Lab Results  Component Value Date   WBC 4.5 10/07/2015   NEUTROABS 2.9 10/07/2015   HGB 10.8* 10/07/2015   HCT 31.7* 10/07/2015   MCV 101.4* 10/07/2015   PLT 352 10/07/2015     STUDIES: No results found.  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  PLAN:    1. Breast cancer: Patient has now completed 4 cycles of dose dense Adriamycin plus Cytoxan. Proceed with cycle 8 of 12 of weekly Taxol. At the conclusion of her chemotherapy, patient will require adjuvant XRT. An aromatase inhibitor would not offer benefit given the triple negative status of her disease. Return to clinic in 1 week for evaluation and consideration of cycle 9.  2. Anxiety: Improving, monitor.  3. Anemia: Patient's hemoglobin is essentially stable.  Secondary to chemotherapy, monitor. 4. Mouth ulcerations: Continue medicated mouthwash and baking soda with water. 5. Peripheral edema: Resolved. 6. Difficulty sleeping: Patient was given a prescription for Ambien.  Patient expressed understanding and was in  agreement with this plan. She also understands that She can call clinic at any time with any questions, concerns, or complaints.   Breast cancer   Staging form: Breast, AJCC 7th Edition     Clinical stage from 06/04/2015: Stage IA (T1c, N0, M0) - Signed by Lloyd Huger, MD on 06/04/2015   Lloyd Huger, MD   10/14/2015 5:51 AM

## 2015-10-21 ENCOUNTER — Inpatient Hospital Stay: Payer: No Typology Code available for payment source

## 2015-10-21 ENCOUNTER — Inpatient Hospital Stay (HOSPITAL_BASED_OUTPATIENT_CLINIC_OR_DEPARTMENT_OTHER): Payer: No Typology Code available for payment source | Admitting: Oncology

## 2015-10-21 VITALS — BP 149/87 | HR 73 | Temp 97.2°F | Resp 18

## 2015-10-21 DIAGNOSIS — C50912 Malignant neoplasm of unspecified site of left female breast: Secondary | ICD-10-CM | POA: Diagnosis not present

## 2015-10-21 DIAGNOSIS — K121 Other forms of stomatitis: Secondary | ICD-10-CM

## 2015-10-21 DIAGNOSIS — F419 Anxiety disorder, unspecified: Secondary | ICD-10-CM | POA: Diagnosis not present

## 2015-10-21 DIAGNOSIS — Z79899 Other long term (current) drug therapy: Secondary | ICD-10-CM

## 2015-10-21 DIAGNOSIS — I10 Essential (primary) hypertension: Secondary | ICD-10-CM

## 2015-10-21 DIAGNOSIS — Z171 Estrogen receptor negative status [ER-]: Secondary | ICD-10-CM | POA: Diagnosis not present

## 2015-10-21 DIAGNOSIS — G629 Polyneuropathy, unspecified: Secondary | ICD-10-CM

## 2015-10-21 DIAGNOSIS — Z809 Family history of malignant neoplasm, unspecified: Secondary | ICD-10-CM

## 2015-10-21 DIAGNOSIS — R531 Weakness: Secondary | ICD-10-CM

## 2015-10-21 DIAGNOSIS — D6481 Anemia due to antineoplastic chemotherapy: Secondary | ICD-10-CM

## 2015-10-21 DIAGNOSIS — R609 Edema, unspecified: Secondary | ICD-10-CM

## 2015-10-21 DIAGNOSIS — G4700 Insomnia, unspecified: Secondary | ICD-10-CM

## 2015-10-21 DIAGNOSIS — R5383 Other fatigue: Secondary | ICD-10-CM

## 2015-10-21 LAB — CBC WITH DIFFERENTIAL/PLATELET
BASOS ABS: 0.1 10*3/uL (ref 0–0.1)
BASOS PCT: 1 %
Eosinophils Absolute: 0.1 10*3/uL (ref 0–0.7)
Eosinophils Relative: 3 %
HEMATOCRIT: 31.4 % — AB (ref 35.0–47.0)
HEMOGLOBIN: 10.8 g/dL — AB (ref 12.0–16.0)
Lymphocytes Relative: 24 %
Lymphs Abs: 0.9 10*3/uL — ABNORMAL LOW (ref 1.0–3.6)
MCH: 34.6 pg — ABNORMAL HIGH (ref 26.0–34.0)
MCHC: 34.3 g/dL (ref 32.0–36.0)
MCV: 100.7 fL — ABNORMAL HIGH (ref 80.0–100.0)
MONO ABS: 0.4 10*3/uL (ref 0.2–0.9)
Monocytes Relative: 11 %
NEUTROS ABS: 2.3 10*3/uL (ref 1.4–6.5)
NEUTROS PCT: 61 %
Platelets: 273 10*3/uL (ref 150–440)
RBC: 3.12 MIL/uL — AB (ref 3.80–5.20)
RDW: 14.2 % (ref 11.5–14.5)
WBC: 3.8 10*3/uL (ref 3.6–11.0)

## 2015-10-21 LAB — COMPREHENSIVE METABOLIC PANEL
ALBUMIN: 3.7 g/dL (ref 3.5–5.0)
ALT: 22 U/L (ref 14–54)
AST: 26 U/L (ref 15–41)
Alkaline Phosphatase: 65 U/L (ref 38–126)
Anion gap: 8 (ref 5–15)
BILIRUBIN TOTAL: 0.3 mg/dL (ref 0.3–1.2)
BUN: 15 mg/dL (ref 6–20)
CALCIUM: 9.2 mg/dL (ref 8.9–10.3)
CO2: 25 mmol/L (ref 22–32)
CREATININE: 0.67 mg/dL (ref 0.44–1.00)
Chloride: 104 mmol/L (ref 101–111)
GFR calc Af Amer: >60 mL/min (ref >60)
GFR calc non Af Amer: >60 mL/min (ref >60)
GLUCOSE: 107 mg/dL — AB (ref 65–99)
Potassium: 4 mmol/L (ref 3.5–5.1)
Sodium: 137 mmol/L (ref 135–145)
TOTAL PROTEIN: 6.4 g/dL — AB (ref 6.5–8.1)

## 2015-10-21 MED ORDER — HEPARIN SOD (PORK) LOCK FLUSH 100 UNIT/ML IV SOLN
500.0000 [IU] | Freq: Once | INTRAVENOUS | Status: AC | PRN
  Administered 2015-10-21: 500 [IU]
  Filled 2015-10-21: qty 5

## 2015-10-21 MED ORDER — SODIUM CHLORIDE 0.9 % IJ SOLN
10.0000 mL | INTRAMUSCULAR | Status: DC | PRN
  Administered 2015-10-21: 10 mL
  Filled 2015-10-21: qty 10

## 2015-10-21 MED ORDER — SODIUM CHLORIDE 0.9 % IV SOLN
Freq: Once | INTRAVENOUS | Status: AC
  Administered 2015-10-21: 09:00:00 via INTRAVENOUS
  Filled 2015-10-21: qty 1000

## 2015-10-21 MED ORDER — SODIUM CHLORIDE 0.9 % IV SOLN
Freq: Once | INTRAVENOUS | Status: AC
  Administered 2015-10-21: 10:00:00 via INTRAVENOUS
  Filled 2015-10-21: qty 4

## 2015-10-21 MED ORDER — DEXTROSE 5 % IV SOLN
80.0000 mg/m2 | Freq: Once | INTRAVENOUS | Status: AC
  Administered 2015-10-21: 138 mg via INTRAVENOUS
  Filled 2015-10-21: qty 23

## 2015-10-21 MED ORDER — DIPHENHYDRAMINE HCL 50 MG/ML IJ SOLN
50.0000 mg | Freq: Once | INTRAMUSCULAR | Status: AC
  Administered 2015-10-21: 25 mg via INTRAVENOUS
  Filled 2015-10-21: qty 1

## 2015-10-21 MED ORDER — FAMOTIDINE IN NACL 20-0.9 MG/50ML-% IV SOLN
20.0000 mg | Freq: Once | INTRAVENOUS | Status: AC
  Administered 2015-10-21: 20 mg via INTRAVENOUS
  Filled 2015-10-21: qty 50

## 2015-10-24 NOTE — Progress Notes (Signed)
[place]  Telephone:(336) [phone] Fax:(336) [phone]  ID: Brittany Ryan OB: Jul 29, 1961  MR#: [id number]  ZES#:[id number]  Patient Care Team: [name], NP as PCP - General (Nurse Practitioner) [name], NP (Nurse Practitioner) Florene Glen, MD (Surgery)  CHIEF COMPLAINT: Breast cancer. No chief complaint on file.   INTERVAL HISTORY: Patient returns to clinic today for further evaluation and consideration of cycle 10 of 12 of weekly Taxol.  She continues to have a mild peripheral neuropathy but does not affect her day-to-day activity. She no longer complains of difficulty sleeping. Her peripheral edema has improved. She has no other neurologic complaints. She denies any recent fevers or illnesses. She has a good appetite and denies weight loss. She denies any pain. She has no chest pain or shortness of breath. She denies any nausea, vomiting, constipation, or diarrhea. She has no urinary complaints. Patient offers no further specific complaints today.  REVIEW OF SYSTEMS:   Review of Systems  Constitutional: Positive for malaise/fatigue.  Respiratory: Negative.   Cardiovascular: Negative.  Negative for leg swelling.  Neurological: Positive for sensory change and weakness.  Psychiatric/Behavioral: The patient is nervous/anxious. The patient does not have insomnia.     As per HPI. Otherwise, a complete review of systems is negatve.  PAST MEDICAL HISTORY: Past Medical History  Diagnosis Date  . Breast cancer   . Seasonal allergies   . Frequent sinus infections   . Hypertension     PAST SURGICAL HISTORY: Past Surgical History  Procedure Laterality Date  . Breast biopsy Left 04/27/2015    results unknown  . Mastectomy, partial Left 05/19/2015    Procedure: MASTECTOMY PARTIAL;  Surgeon: Florene Glen, MD;  Location: [facility];  Service: General;  Laterality: Left;  . Breast lumpectomy with needle localization Left 05/19/2015    Procedure: BREAST  LUMPECTOMY WITH NEEDLE LOCALIZATION;  Surgeon: Florene Glen, MD;  Location: [facility];  Service: General;  Laterality: Left;  . Breast lumpectomy with sentinel lymph node biopsy Left 05/19/2015    Procedure: BREAST LUMPECTOMY WITH SENTINEL LYMPH NODE BX;  Surgeon: Florene Glen, MD;  Location: [facility];  Service: General;  Laterality: Left;  . Portacath placement Left 05/19/2015    Procedure: INSERTION PORT-A-CATH;  Surgeon: Florene Glen, MD;  Location: [facility];  Service: General;  Laterality: Left;    FAMILY HISTORY Family History  Problem Relation Age of Onset  . Cancer Father   . Hypertension Father   . CAD Father   . Heart attack Father        ADVANCED DIRECTIVES:    HEALTH MAINTENANCE: Social History  Substance Use Topics  . Smoking status: Never Smoker   . Smokeless tobacco: Never Used  . Alcohol Use: Yes     Comment: rare beer     Colonoscopy:  PAP:  Bone density:  Lipid panel:  No Known Allergies  Current Outpatient Prescriptions  Medication Sig Dispense Refill  . acetaminophen (TYLENOL) 500 MG tablet Take 1,000 mg by mouth every 6 (six) hours as needed for mild pain or headache.    Marland Kitchen aspirin 325 MG tablet Take 325 mg by mouth daily.    . B Complex Vitamins (VITAMIN B COMPLEX PO) Take 1 tablet by mouth daily as needed.    . Biotin 10 MG CAPS Take by mouth.    . Calcium Carbonate-Vitamin D (CALCIUM-VITAMIN D) 500-200 MG-UNIT per tablet Take 1 tablet by mouth daily.    . chlorpheniramine-HYDROcodone (TUSSIONEX) 10-8  MG/5ML SUER Take 5 mLs by mouth every 12 (twelve) hours as needed for cough. 140 mL 0  . fluticasone (FLONASE) 50 MCG/ACT nasal spray Place into the nose.    . lidocaine-prilocaine (EMLA) cream Apply 1 application topically as needed. Apply to port and cover with saran wrap 1-2 hours before chemotherapy 30 g 2  . loratadine (CLARITIN) 10 MG tablet Take by mouth.    . magic mouthwash SOLN Take 5 mLs by mouth 4 (four) times daily as needed for  mouth pain. 480 mL 1  . Multiple Vitamin (MULTIVITAMIN) tablet Take 1 tablet by mouth daily.    . ondansetron (ZOFRAN-ODT) 4 MG disintegrating tablet Take 1 tablet (4 mg total) by mouth every 8 (eight) hours as needed for nausea or vomiting. 20 tablet 0  . polyethylene glycol powder (GLYCOLAX/MIRALAX) powder Take 1 Container by mouth once.    . prochlorperazine (COMPAZINE) 10 MG tablet Take 1 tablet (10 mg total) by mouth every 6 (six) hours as needed for nausea or vomiting. 60 tablet 0  . senna (SENOKOT) 8.6 MG TABS tablet Take 1 tablet by mouth.    . zolpidem (AMBIEN) 5 MG tablet Take 1 tablet (5 mg total) by mouth at bedtime as needed for sleep. 30 tablet 0   No current facility-administered medications for this visit.   Facility-Administered Medications Ordered in Other Visits  Medication Dose Route Frequency Provider Last Rate Last Dose  . heparin lock flush 100 unit/mL  500 Units Intravenous Once Lloyd Huger, MD      . heparin lock flush 100 unit/mL  500 Units Intravenous Once Lloyd Huger, MD      . heparin lock flush 100 unit/mL  500 Units Intracatheter Once PRN Lloyd Huger, MD      . sodium chloride 0.9 % injection 10 mL  10 mL Intracatheter PRN Lloyd Huger, MD   10 mL at 07/22/15 0956  . sodium chloride 0.9 % injection 10 mL  10 mL Intravenous PRN Lloyd Huger, MD   10 mL at 09/02/15 0941  . sodium chloride 0.9 % injection 10 mL  10 mL Intravenous PRN Lloyd Huger, MD      . sodium chloride 0.9 % injection 10 mL  10 mL Intracatheter PRN Lloyd Huger, MD   10 mL at 09/23/15 1205  . sodium chloride 0.9 % injection 10 mL  10 mL Intracatheter PRN Lloyd Huger, MD   10 mL at 09/30/15 0900    OBJECTIVE: There were no vitals filed for this visit.   There is no weight on file to calculate BMI.    ECOG FS:0 - Asymptomatic  General: Well-developed, well-nourished, no acute distress. Eyes: anicteric sclera. Breasts: Exam deferred  today. Lungs: Clear to auscultation bilaterally. Heart: Regular rate and rhythm. No rubs, murmurs, or gallops. Abdomen: Soft, nontender, nondistended. No organomegaly noted, normoactive bowel sounds. Musculoskeletal: No edema, cyanosis, or clubbing. Neuro: Alert, answering all questions appropriately. Cranial nerves grossly intact. Skin: No rashes or petechiae noted. Psych: Normal affect.   LAB RESULTS:  Lab Results  Component Value Date   NA 137 10/21/2015   K 4.0 10/21/2015   CL 104 10/21/2015   CO2 25 10/21/2015   GLUCOSE 107* 10/21/2015   BUN 15 10/21/2015   CREATININE 0.67 10/21/2015   CALCIUM 9.2 10/21/2015   PROT 6.4* 10/21/2015   ALBUMIN 3.7 10/21/2015   AST 26 10/21/2015   ALT 22 10/21/2015   ALKPHOS 65 10/21/2015  BILITOT 0.3 10/21/2015   GFRNONAA >60 10/21/2015   GFRAA >60 10/21/2015    Lab Results  Component Value Date   WBC 3.8 10/21/2015   NEUTROABS 2.3 10/21/2015   HGB 10.8* 10/21/2015   HCT 31.4* 10/21/2015   MCV 100.7* 10/21/2015   PLT 273 10/21/2015     STUDIES: No results found.  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  PLAN:    1. Breast cancer: Patient has now completed 4 cycles of dose dense Adriamycin plus Cytoxan. Proceed with cycle 10 of 12 of weekly Taxol. At the conclusion of her chemotherapy, patient will require adjuvant XRT. An aromatase inhibitor would not offer benefit given the triple negative status of her disease. Return to clinic on Monday, November 28 for consideration of cycle 11 and then one week later in [place] for consideration of cycle 12.  2. Anxiety: Improving, monitor.  3. Anemia: Patient's hemoglobin is essentially stable.  Secondary to chemotherapy, monitor. 4. Mouth ulcerations: Continue medicated mouthwash and baking soda with water. 5. Peripheral edema: Resolved. 6. Difficulty sleeping: Continue Ambien as needed.  Patient expressed  understanding and was in agreement with this plan. She also understands that She can call clinic at any time with any questions, concerns, or complaints.   Breast cancer   Staging form: Breast, AJCC 7th Edition     Clinical stage from 06/04/2015: Stage IA (T1c, N0, M0) - Signed by Lloyd Huger, MD on 06/04/2015   Lloyd Huger, MD   10/24/2015 8:05 AM

## 2015-10-31 ENCOUNTER — Inpatient Hospital Stay: Payer: No Typology Code available for payment source

## 2015-10-31 VITALS — BP 164/86 | HR 65 | Temp 97.5°F | Resp 18

## 2015-10-31 DIAGNOSIS — C50912 Malignant neoplasm of unspecified site of left female breast: Secondary | ICD-10-CM

## 2015-10-31 LAB — COMPREHENSIVE METABOLIC PANEL
ALK PHOS: 68 U/L (ref 38–126)
ALT: 21 U/L (ref 14–54)
ANION GAP: 9 (ref 5–15)
AST: 28 U/L (ref 15–41)
Albumin: 3.4 g/dL — ABNORMAL LOW (ref 3.5–5.0)
BILIRUBIN TOTAL: 0.4 mg/dL (ref 0.3–1.2)
BUN: 18 mg/dL (ref 6–20)
CALCIUM: 8.7 mg/dL — AB (ref 8.9–10.3)
CO2: 24 mmol/L (ref 22–32)
Chloride: 103 mmol/L (ref 101–111)
Creatinine, Ser: 0.73 mg/dL (ref 0.44–1.00)
GFR calc non Af Amer: >60 mL/min (ref >60)
Glucose, Bld: 113 mg/dL — ABNORMAL HIGH (ref 65–99)
Potassium: 3.4 mmol/L — ABNORMAL LOW (ref 3.5–5.1)
SODIUM: 136 mmol/L (ref 135–145)
TOTAL PROTEIN: 6 g/dL — AB (ref 6.5–8.1)

## 2015-10-31 LAB — CBC WITH DIFFERENTIAL/PLATELET
Basophils Absolute: 0 10*3/uL (ref 0–0.1)
Basophils Relative: 1 %
EOS ABS: 0.1 10*3/uL (ref 0–0.7)
Eosinophils Relative: 3 %
HCT: 31.3 % — ABNORMAL LOW (ref 35.0–47.0)
HEMOGLOBIN: 10.6 g/dL — AB (ref 12.0–16.0)
LYMPHS ABS: 0.7 10*3/uL — AB (ref 1.0–3.6)
Lymphocytes Relative: 21 %
MCH: 34.4 pg — AB (ref 26.0–34.0)
MCHC: 33.9 g/dL (ref 32.0–36.0)
MCV: 101.5 fL — ABNORMAL HIGH (ref 80.0–100.0)
MONO ABS: 0.4 10*3/uL (ref 0.2–0.9)
MONOS PCT: 13 %
NEUTROS PCT: 62 %
Neutro Abs: 2.1 10*3/uL (ref 1.4–6.5)
Platelets: 275 10*3/uL (ref 150–440)
RBC: 3.08 MIL/uL — ABNORMAL LOW (ref 3.80–5.20)
RDW: 14.4 % (ref 11.5–14.5)
WBC: 3.4 10*3/uL — ABNORMAL LOW (ref 3.6–11.0)

## 2015-10-31 MED ORDER — SODIUM CHLORIDE 0.9 % IV SOLN
Freq: Once | INTRAVENOUS | Status: AC
  Administered 2015-10-31: 09:00:00 via INTRAVENOUS
  Filled 2015-10-31: qty 1000

## 2015-10-31 MED ORDER — FAMOTIDINE IN NACL 20-0.9 MG/50ML-% IV SOLN
20.0000 mg | Freq: Once | INTRAVENOUS | Status: AC
  Administered 2015-10-31: 20 mg via INTRAVENOUS
  Filled 2015-10-31: qty 50

## 2015-10-31 MED ORDER — HEPARIN SOD (PORK) LOCK FLUSH 100 UNIT/ML IV SOLN
500.0000 [IU] | Freq: Once | INTRAVENOUS | Status: AC
  Administered 2015-10-31: 500 [IU] via INTRAVENOUS
  Filled 2015-10-31: qty 45

## 2015-10-31 MED ORDER — SODIUM CHLORIDE 0.9 % IV SOLN
Freq: Once | INTRAVENOUS | Status: AC
  Administered 2015-10-31: 09:00:00 via INTRAVENOUS
  Filled 2015-10-31: qty 4

## 2015-10-31 MED ORDER — PACLITAXEL CHEMO INJECTION 300 MG/50ML
80.0000 mg/m2 | Freq: Once | INTRAVENOUS | Status: AC
  Administered 2015-10-31: 138 mg via INTRAVENOUS
  Filled 2015-10-31: qty 23

## 2015-10-31 MED ORDER — DIPHENHYDRAMINE HCL 50 MG/ML IJ SOLN
50.0000 mg | Freq: Once | INTRAMUSCULAR | Status: AC
  Administered 2015-10-31: 50 mg via INTRAVENOUS
  Filled 2015-10-31: qty 1

## 2015-10-31 MED ORDER — HEPARIN SOD (PORK) LOCK FLUSH 100 UNIT/ML IV SOLN: 500.0000 [IU] | Freq: Once | INTRAVENOUS | Status: DC | PRN

## 2015-10-31 MED ORDER — SODIUM CHLORIDE 0.9 % IJ SOLN
10.0000 mL | INTRAMUSCULAR | Status: DC | PRN
  Administered 2015-10-31: 10 mL via INTRAVENOUS
  Filled 2015-10-31: qty 10

## 2015-10-31 NOTE — Progress Notes (Signed)
[place]  [phone] [phone]) [phone]  ID: Brittany Ryan OB: 01/29/1961  MR#: [id number]  VBT#:[id number]  Patient Care Team: [name], NP as PCP - General (Nurse Practitioner) [name], NP (Nurse Practitioner) Florene Glen, MD (Surgery)  CHIEF COMPLAINT: Breast cancer. No chief complaint on file.   INTERVAL HISTORY: Patient returns to clinic today for further evaluation and consideration of cycle 9 of 12 of weekly Taxol. She does not complain of difficulty sleeping today. Her peripheral edema is nearly resolved.  She does not complain of any peripheral neuropathy. She has no neurologic complaints. She denies any recent fevers or illnesses. She has a good appetite and denies weight loss. She denies any pain. She has no chest pain or shortness of breath. She denies any nausea, vomiting, constipation, or diarrhea. She has no urinary complaints. Patient offers no specific complaints today.  REVIEW OF SYSTEMS:   Review of Systems  Constitutional: Negative for malaise/fatigue.  Respiratory: Negative.   Cardiovascular: Negative.   Neurological: Negative for weakness.  Psychiatric/Behavioral: The patient is not nervous/anxious and does not have insomnia.     As per HPI. Otherwise, a complete review of systems is negatve.  PAST MEDICAL HISTORY: Past Medical History  Diagnosis Date  . Breast cancer   . Seasonal allergies   . Frequent sinus infections   . Hypertension     PAST SURGICAL HISTORY: Past Surgical History  Procedure Laterality Date  . Breast biopsy Left 04/27/2015    results unknown  . Mastectomy, partial Left 05/19/2015    Procedure: MASTECTOMY PARTIAL;  Surgeon: Florene Glen, MD;  Location: [facility];  Service: General;  Laterality: Left;  . Breast lumpectomy with needle localization Left 05/19/2015    Procedure: BREAST LUMPECTOMY WITH NEEDLE LOCALIZATION;  Surgeon: Florene Glen, MD;  Location: [facility];  Service:  General;  Laterality: Left;  . Breast lumpectomy with sentinel lymph node biopsy Left 05/19/2015    Procedure: BREAST LUMPECTOMY WITH SENTINEL LYMPH NODE BX;  Surgeon: Florene Glen, MD;  Location: [facility];  Service: General;  Laterality: Left;  . Portacath placement Left 05/19/2015    Procedure: INSERTION PORT-A-CATH;  Surgeon: Florene Glen, MD;  Location: [facility];  Service: General;  Laterality: Left;    FAMILY HISTORY Family History  Problem Relation Age of Onset  . Cancer Father   . Hypertension Father   . CAD Father   . Heart attack Father        ADVANCED DIRECTIVES:    HEALTH MAINTENANCE: Social History  Substance Use Topics  . Smoking status: Never Smoker   . Smokeless tobacco: Never Used  . Alcohol Use: Yes     Comment: rare beer     Colonoscopy:  PAP:  Bone density:  Lipid panel:  No Known Allergies  Current Outpatient Prescriptions  Medication Sig Dispense Refill  . acetaminophen (TYLENOL) 500 MG tablet Take 1,000 mg by mouth every 6 (six) hours as needed for mild pain or headache.    Marland Kitchen aspirin 325 MG tablet Take 325 mg by mouth daily.    . B Complex Vitamins (VITAMIN B COMPLEX PO) Take 1 tablet by mouth daily as needed.    . Biotin 10 MG CAPS Take by mouth.    . Calcium Carbonate-Vitamin D (CALCIUM-VITAMIN D) 500-200 MG-UNIT per tablet Take 1 tablet by mouth daily.    . chlorpheniramine-HYDROcodone (TUSSIONEX) 10-8 MG/5ML SUER Take 5 mLs by mouth every 12 (twelve) hours as needed for  cough. 140 mL 0  . fluticasone (FLONASE) 50 MCG/ACT nasal spray Place into the nose.    . lidocaine-prilocaine (EMLA) cream Apply 1 application topically as needed. Apply to port and cover with saran wrap 1-2 hours before chemotherapy 30 g 2  . loratadine (CLARITIN) 10 MG tablet Take by mouth.    . magic mouthwash SOLN Take 5 mLs by mouth 4 (four) times daily as needed for mouth pain. 480 mL 1  . Multiple Vitamin (MULTIVITAMIN) tablet Take 1 tablet by mouth daily.    .  ondansetron (ZOFRAN-ODT) 4 MG disintegrating tablet Take 1 tablet (4 mg total) by mouth every 8 (eight) hours as needed for nausea or vomiting. 20 tablet 0  . polyethylene glycol powder (GLYCOLAX/MIRALAX) powder Take 1 Container by mouth once.    . prochlorperazine (COMPAZINE) 10 MG tablet Take 1 tablet (10 mg total) by mouth every 6 (six) hours as needed for nausea or vomiting. 60 tablet 0  . senna (SENOKOT) 8.6 MG TABS tablet Take 1 tablet by mouth.    . zolpidem (AMBIEN) 5 MG tablet Take 1 tablet (5 mg total) by mouth at bedtime as needed for sleep. 30 tablet 0   No current facility-administered medications for this visit.   Facility-Administered Medications Ordered in Other Visits  Medication Dose Route Frequency Provider Last Rate Last Dose  . heparin lock flush 100 unit/mL  500 Units Intravenous Once Lloyd Huger, MD      . heparin lock flush 100 unit/mL  500 Units Intravenous Once Lloyd Huger, MD      . heparin lock flush 100 unit/mL  500 Units Intracatheter Once PRN Lloyd Huger, MD      . sodium chloride 0.9 % injection 10 mL  10 mL Intracatheter PRN Lloyd Huger, MD   10 mL at 07/22/15 0956  . sodium chloride 0.9 % injection 10 mL  10 mL Intravenous PRN Lloyd Huger, MD   10 mL at 09/02/15 0941  . sodium chloride 0.9 % injection 10 mL  10 mL Intravenous PRN Lloyd Huger, MD      . sodium chloride 0.9 % injection 10 mL  10 mL Intracatheter PRN Lloyd Huger, MD   10 mL at 09/23/15 1205  . sodium chloride 0.9 % injection 10 mL  10 mL Intracatheter PRN Lloyd Huger, MD   10 mL at 09/30/15 0900    OBJECTIVE: There were no vitals filed for this visit.   There is no weight on file to calculate BMI.    ECOG FS:0 - Asymptomatic  General: Well-developed, well-nourished, no acute distress. Eyes: anicteric sclera. Breasts: Exam deferred today. Lungs: Clear to auscultation bilaterally. Heart: Regular rate and rhythm. No rubs, murmurs, or  gallops. Abdomen: Soft, nontender, nondistended. No organomegaly noted, normoactive bowel sounds. Musculoskeletal: No edema, cyanosis, or clubbing. Neuro: Alert, answering all questions appropriately. Cranial nerves grossly intact. Skin: No rashes or petechiae noted. Psych: Normal affect.   LAB RESULTS:  Lab Results  Component Value Date   NA 136 10/31/2015   K 3.4* 10/31/2015   CL 103 10/31/2015   CO2 24 10/31/2015   GLUCOSE 113* 10/31/2015   BUN 18 10/31/2015   CREATININE 0.73 10/31/2015   CALCIUM 8.7* 10/31/2015   PROT 6.0* 10/31/2015   ALBUMIN 3.4* 10/31/2015   AST 28 10/31/2015   ALT 21 10/31/2015   ALKPHOS 68 10/31/2015   BILITOT 0.4 10/31/2015   GFRNONAA >60 10/31/2015   GFRAA >60 10/31/2015  Lab Results  Component Value Date   WBC 3.4* 10/31/2015   NEUTROABS 2.1 10/31/2015   HGB 10.6* 10/31/2015   HCT 31.3* 10/31/2015   MCV 101.5* 10/31/2015   PLT 275 10/31/2015     STUDIES: No results found.  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  PLAN:    1. Breast cancer: Patient has now completed 4 cycles of dose dense Adriamycin plus Cytoxan. Proceed with cycle 9 of 12 of weekly Taxol. At the conclusion of her chemotherapy, patient will require adjuvant XRT. An aromatase inhibitor would not offer benefit given the triple negative status of her disease. Return to clinic in 1 week for evaluation and consideration of cycle 10.  2. Anxiety: Improving, monitor.  3. Anemia: Patient's hemoglobin is essentially stable.  Secondary to chemotherapy, monitor. 4. Mouth ulcerations: Resolved. 5. Peripheral edema: Resolved. 6. Difficulty sleeping: Continue Ambien as needed.  Patient expressed understanding and was in agreement with this plan. She also understands that She can call clinic at any time with any questions, concerns, or complaints.   Breast cancer   Staging form: Breast, AJCC 7th Edition      Clinical stage from 06/04/2015: Stage IA (T1c, N0, M0) - Signed by Lloyd Huger, MD on 06/04/2015   Lloyd Huger, MD   10/31/2015 10:52 PM

## 2015-11-07 ENCOUNTER — Inpatient Hospital Stay: Payer: No Typology Code available for payment source

## 2015-11-07 ENCOUNTER — Inpatient Hospital Stay: Payer: No Typology Code available for payment source | Attending: Oncology | Admitting: Oncology

## 2015-11-07 VITALS — BP 134/81 | HR 93 | Temp 98.2°F | Resp 18 | Wt 223.5 lb

## 2015-11-07 DIAGNOSIS — R5383 Other fatigue: Secondary | ICD-10-CM | POA: Insufficient documentation

## 2015-11-07 DIAGNOSIS — R531 Weakness: Secondary | ICD-10-CM | POA: Diagnosis not present

## 2015-11-07 DIAGNOSIS — Z79899 Other long term (current) drug therapy: Secondary | ICD-10-CM | POA: Insufficient documentation

## 2015-11-07 DIAGNOSIS — Z9012 Acquired absence of left breast and nipple: Secondary | ICD-10-CM | POA: Diagnosis not present

## 2015-11-07 DIAGNOSIS — Z171 Estrogen receptor negative status [ER-]: Secondary | ICD-10-CM | POA: Insufficient documentation

## 2015-11-07 DIAGNOSIS — G4700 Insomnia, unspecified: Secondary | ICD-10-CM | POA: Insufficient documentation

## 2015-11-07 DIAGNOSIS — F419 Anxiety disorder, unspecified: Secondary | ICD-10-CM | POA: Insufficient documentation

## 2015-11-07 DIAGNOSIS — Z7982 Long term (current) use of aspirin: Secondary | ICD-10-CM | POA: Diagnosis not present

## 2015-11-07 DIAGNOSIS — D6481 Anemia due to antineoplastic chemotherapy: Secondary | ICD-10-CM | POA: Insufficient documentation

## 2015-11-07 DIAGNOSIS — C50912 Malignant neoplasm of unspecified site of left female breast: Secondary | ICD-10-CM | POA: Diagnosis not present

## 2015-11-07 DIAGNOSIS — I10 Essential (primary) hypertension: Secondary | ICD-10-CM | POA: Diagnosis not present

## 2015-11-07 DIAGNOSIS — Z5111 Encounter for antineoplastic chemotherapy: Secondary | ICD-10-CM | POA: Diagnosis not present

## 2015-11-07 DIAGNOSIS — J328 Other chronic sinusitis: Secondary | ICD-10-CM | POA: Insufficient documentation

## 2015-11-07 DIAGNOSIS — G629 Polyneuropathy, unspecified: Secondary | ICD-10-CM | POA: Insufficient documentation

## 2015-11-07 DIAGNOSIS — T451X5S Adverse effect of antineoplastic and immunosuppressive drugs, sequela: Secondary | ICD-10-CM | POA: Diagnosis not present

## 2015-11-07 DIAGNOSIS — Z809 Family history of malignant neoplasm, unspecified: Secondary | ICD-10-CM | POA: Insufficient documentation

## 2015-11-07 LAB — COMPREHENSIVE METABOLIC PANEL
ALBUMIN: 3.6 g/dL (ref 3.5–5.0)
ALK PHOS: 68 U/L (ref 38–126)
ALT: 23 U/L (ref 14–54)
ANION GAP: 8 (ref 5–15)
AST: 24 U/L (ref 15–41)
BILIRUBIN TOTAL: 0.4 mg/dL (ref 0.3–1.2)
BUN: 16 mg/dL (ref 6–20)
CALCIUM: 8.8 mg/dL — AB (ref 8.9–10.3)
CO2: 24 mmol/L (ref 22–32)
CREATININE: 0.76 mg/dL (ref 0.44–1.00)
Chloride: 103 mmol/L (ref 101–111)
GFR calc Af Amer: >60 mL/min (ref >60)
GFR calc non Af Amer: >60 mL/min (ref >60)
GLUCOSE: 126 mg/dL — AB (ref 65–99)
Potassium: 3.9 mmol/L (ref 3.5–5.1)
Sodium: 135 mmol/L (ref 135–145)
TOTAL PROTEIN: 6.4 g/dL — AB (ref 6.5–8.1)

## 2015-11-07 LAB — CBC WITH DIFFERENTIAL/PLATELET
BASOS ABS: 0.1 10*3/uL (ref 0–0.1)
BASOS PCT: 2 %
EOS ABS: 0.2 10*3/uL (ref 0–0.7)
Eosinophils Relative: 4 %
HCT: 33.6 % — ABNORMAL LOW (ref 35.0–47.0)
Hemoglobin: 11.5 g/dL — ABNORMAL LOW (ref 12.0–16.0)
Lymphocytes Relative: 22 %
Lymphs Abs: 0.9 10*3/uL — ABNORMAL LOW (ref 1.0–3.6)
MCH: 34.2 pg — ABNORMAL HIGH (ref 26.0–34.0)
MCHC: 34.2 g/dL (ref 32.0–36.0)
MCV: 100 fL (ref 80.0–100.0)
MONO ABS: 0.4 10*3/uL (ref 0.2–0.9)
MONOS PCT: 10 %
NEUTROS PCT: 62 %
Neutro Abs: 2.6 10*3/uL (ref 1.4–6.5)
Platelets: 291 10*3/uL (ref 150–440)
RBC: 3.36 MIL/uL — ABNORMAL LOW (ref 3.80–5.20)
RDW: 13.5 % (ref 11.5–14.5)
WBC: 4.2 10*3/uL (ref 3.6–11.0)

## 2015-11-07 MED ORDER — HEPARIN SOD (PORK) LOCK FLUSH 100 UNIT/ML IV SOLN: 500.0000 [IU] | Freq: Once | INTRAVENOUS | Status: DC | PRN

## 2015-11-07 MED ORDER — HEPARIN SOD (PORK) LOCK FLUSH 100 UNIT/ML IV SOLN
500.0000 [IU] | Freq: Once | INTRAVENOUS | Status: AC
  Administered 2015-11-07: 500 [IU] via INTRAVENOUS
  Filled 2015-11-07: qty 5

## 2015-11-07 MED ORDER — DIPHENHYDRAMINE HCL 50 MG/ML IJ SOLN
50.0000 mg | Freq: Once | INTRAMUSCULAR | Status: AC
  Administered 2015-11-07: 50 mg via INTRAVENOUS
  Filled 2015-11-07: qty 1

## 2015-11-07 MED ORDER — SODIUM CHLORIDE 0.9 % IJ SOLN
10.0000 mL | INTRAMUSCULAR | Status: DC | PRN
  Filled 2015-11-07: qty 10

## 2015-11-07 MED ORDER — FAMOTIDINE IN NACL 20-0.9 MG/50ML-% IV SOLN
20.0000 mg | Freq: Once | INTRAVENOUS | Status: AC
Start: 2015-11-07 — End: 2015-11-07
  Administered 2015-11-07: 20 mg via INTRAVENOUS
  Filled 2015-11-07: qty 50

## 2015-11-07 MED ORDER — SODIUM CHLORIDE 0.9 % IV SOLN
Freq: Once | INTRAVENOUS | Status: AC
  Administered 2015-11-07: 11:00:00 via INTRAVENOUS
  Filled 2015-11-07: qty 4

## 2015-11-07 MED ORDER — PACLITAXEL CHEMO INJECTION 300 MG/50ML
80.0000 mg/m2 | Freq: Once | INTRAVENOUS | Status: AC
  Administered 2015-11-07: 138 mg via INTRAVENOUS
  Filled 2015-11-07: qty 23

## 2015-11-07 MED ORDER — SODIUM CHLORIDE 0.9 % IJ SOLN
10.0000 mL | Freq: Once | INTRAMUSCULAR | Status: AC
  Administered 2015-11-07: 10 mL via INTRAVENOUS
  Filled 2015-11-07: qty 10

## 2015-11-07 MED ORDER — SODIUM CHLORIDE 0.9 % IV SOLN
Freq: Once | INTRAVENOUS | Status: AC
  Administered 2015-11-07: 11:00:00 via INTRAVENOUS
  Filled 2015-11-07: qty 1000

## 2015-11-07 MED ORDER — ZOLPIDEM TARTRATE 5 MG PO TABS: 5.0000 mg | ORAL_TABLET | Freq: Every evening | ORAL | Status: DC | PRN

## 2015-11-07 NOTE — Progress Notes (Signed)
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

## 2015-11-20 NOTE — Progress Notes (Signed)
[facility]  [phone]) [phone] Fax:(336) [phone]  ID: [name] OB: March 06, 1961  MR#: [id number]  PVV#:[id number]  Patient Care Team: [name], NP as PCP - General (Nurse Practitioner) [name], NP (Nurse Practitioner) Florene Glen, MD (Surgery)  CHIEF COMPLAINT: Breast cancer. Chief Complaint  Patient presents with  . Breast Cancer    INTERVAL HISTORY: Patient returns to clinic today for further evaluation and consideration of cycle 12 of 12 of weekly Taxol.  She continues to have a mild peripheral neuropathy but does not affect her day-to-day activity. She has no other neurologic complaints. She denies any recent fevers or illnesses. She has a good appetite and denies weight loss. She denies any pain. She has no chest pain or shortness of breath. She denies any nausea, vomiting, constipation, or diarrhea. She has no urinary complaints. Patient offers no further specific complaints today.  REVIEW OF SYSTEMS:   Review of Systems  Constitutional: Positive for malaise/fatigue.  Respiratory: Negative.   Cardiovascular: Negative.  Negative for leg swelling.  Gastrointestinal: Negative.   Musculoskeletal: Negative.   Neurological: Positive for sensory change and weakness.  Psychiatric/Behavioral: The patient is nervous/anxious. The patient does not have insomnia.     As per HPI. Otherwise, a complete review of systems is negatve.  PAST MEDICAL HISTORY: Past Medical History  Diagnosis Date  . Breast cancer   . Seasonal allergies   . Frequent sinus infections   . Hypertension     PAST SURGICAL HISTORY: Past Surgical History  Procedure Laterality Date  . Breast biopsy Left 04/27/2015    results unknown  . Mastectomy, partial Left 05/19/2015    Procedure: MASTECTOMY PARTIAL;  Surgeon: Florene Glen, MD;  Location: [facility];  Service: General;  Laterality: Left;  . Breast lumpectomy with needle localization Left 05/19/2015    Procedure:  BREAST LUMPECTOMY WITH NEEDLE LOCALIZATION;  Surgeon: Florene Glen, MD;  Location: [facility];  Service: General;  Laterality: Left;  . Breast lumpectomy with sentinel lymph node biopsy Left 05/19/2015    Procedure: BREAST LUMPECTOMY WITH SENTINEL LYMPH NODE BX;  Surgeon: Florene Glen, MD;  Location: [facility];  Service: General;  Laterality: Left;  . Portacath placement Left 05/19/2015    Procedure: INSERTION PORT-A-CATH;  Surgeon: Florene Glen, MD;  Location: [facility];  Service: General;  Laterality: Left;    FAMILY HISTORY Family History  Problem Relation Age of Onset  . Cancer Father   . Hypertension Father   . CAD Father   . Heart attack Father        ADVANCED DIRECTIVES:    HEALTH MAINTENANCE: Social History  Substance Use Topics  . Smoking status: Never Smoker   . Smokeless tobacco: Never Used  . Alcohol Use: Yes     Comment: rare beer     Colonoscopy:  PAP:  Bone density:  Lipid panel:  No Known Allergies  Current Outpatient Prescriptions  Medication Sig Dispense Refill  . acetaminophen (TYLENOL) 500 MG tablet Take 1,000 mg by mouth every 6 (six) hours as needed for mild pain or headache.    Marland Kitchen aspirin 325 MG tablet Take 325 mg by mouth daily.    . B Complex Vitamins (VITAMIN B COMPLEX PO) Take 1 tablet by mouth daily as needed.    . Biotin 10 MG CAPS Take by mouth.    . Calcium Carbonate-Vitamin D (CALCIUM-VITAMIN D) 500-200 MG-UNIT per tablet Take 1 tablet by mouth daily.    . chlorpheniramine-HYDROcodone (  TUSSIONEX) 10-8 MG/5ML SUER Take 5 mLs by mouth every 12 (twelve) hours as needed for cough. 140 mL 0  . fluticasone (FLONASE) 50 MCG/ACT nasal spray Place into the nose.    . lidocaine-prilocaine (EMLA) cream Apply 1 application topically as needed. Apply to port and cover with saran wrap 1-2 hours before chemotherapy 30 g 2  . loratadine (CLARITIN) 10 MG tablet Take by mouth.    . magic mouthwash SOLN Take 5 mLs by mouth 4 (four) times daily as  needed for mouth pain. 480 mL 1  . Multiple Vitamin (MULTIVITAMIN) tablet Take 1 tablet by mouth daily.    . ondansetron (ZOFRAN-ODT) 4 MG disintegrating tablet Take 1 tablet (4 mg total) by mouth every 8 (eight) hours as needed for nausea or vomiting. 20 tablet 0  . polyethylene glycol powder (GLYCOLAX/MIRALAX) powder Take 1 Container by mouth once.    . prochlorperazine (COMPAZINE) 10 MG tablet Take 1 tablet (10 mg total) by mouth every 6 (six) hours as needed for nausea or vomiting. 60 tablet 0  . senna (SENOKOT) 8.6 MG TABS tablet Take 1 tablet by mouth.    . zolpidem (AMBIEN) 5 MG tablet Take 1 tablet (5 mg total) by mouth at bedtime as needed for sleep. 30 tablet 0   No current facility-administered medications for this visit.   Facility-Administered Medications Ordered in Other Visits  Medication Dose Route Frequency Provider Last Rate Last Dose  . heparin lock flush 100 unit/mL  500 Units Intravenous Once Lloyd Huger, MD      . heparin lock flush 100 unit/mL  500 Units Intravenous Once Lloyd Huger, MD      . heparin lock flush 100 unit/mL  500 Units Intracatheter Once PRN Lloyd Huger, MD      . sodium chloride 0.9 % injection 10 mL  10 mL Intracatheter PRN Lloyd Huger, MD   10 mL at 07/22/15 0956  . sodium chloride 0.9 % injection 10 mL  10 mL Intravenous PRN Lloyd Huger, MD   10 mL at 09/02/15 0941  . sodium chloride 0.9 % injection 10 mL  10 mL Intravenous PRN Lloyd Huger, MD      . sodium chloride 0.9 % injection 10 mL  10 mL Intracatheter PRN Lloyd Huger, MD   10 mL at 09/23/15 1205  . sodium chloride 0.9 % injection 10 mL  10 mL Intracatheter PRN Lloyd Huger, MD   10 mL at 09/30/15 0900    OBJECTIVE: Filed Vitals:   11/07/15 0933  BP: 134/81  Pulse: 93  Temp: 98.2 F (36.8 C)  Resp: 18     Body mass index is 33 kg/(m^2).    ECOG FS:0 - Asymptomatic  General: Well-developed, well-nourished, no acute distress. Eyes:  anicteric sclera. Breasts: Exam deferred today. Lungs: Clear to auscultation bilaterally. Heart: Regular rate and rhythm. No rubs, murmurs, or gallops. Abdomen: Soft, nontender, nondistended. No organomegaly noted, normoactive bowel sounds. Musculoskeletal: No edema, cyanosis, or clubbing. Neuro: Alert, answering all questions appropriately. Cranial nerves grossly intact. Skin: No rashes or petechiae noted. Psych: Normal affect.   LAB RESULTS:  Lab Results  Component Value Date   NA 135 11/07/2015   K 3.9 11/07/2015   CL 103 11/07/2015   CO2 24 11/07/2015   GLUCOSE 126* 11/07/2015   BUN 16 11/07/2015   CREATININE 0.76 11/07/2015   CALCIUM 8.8* 11/07/2015   PROT 6.4* 11/07/2015   ALBUMIN 3.6 11/07/2015  AST 24 11/07/2015   ALT 23 11/07/2015   ALKPHOS 68 11/07/2015   BILITOT 0.4 11/07/2015   GFRNONAA >60 11/07/2015   GFRAA >60 11/07/2015    Lab Results  Component Value Date   WBC 4.2 11/07/2015   NEUTROABS 2.6 11/07/2015   HGB 11.5* 11/07/2015   HCT 33.6* 11/07/2015   MCV 100.0 11/07/2015   PLT 291 11/07/2015     STUDIES: No results found.  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  PLAN:    1. Breast cancer: Patient has now completed 4 cycles of dose dense Adriamycin plus Cytoxan. Proceed with cycle 12 of 12 of weekly Taxol.  Patient will have consultation with radiation oncology in the next 1-2 weeks to proceed with adjuvant XRT. An aromatase inhibitor would not offer benefit given the triple negative status of her disease. Return to clinic  In approximately 2 months near the conclusion of her XRT for further evaluation. 2. Anxiety: Improving, monitor.  3. Anemia: Patient's hemoglobin is decreased, but stable.  Secondary to chemotherapy, monitor. 4. Mouth ulcerations:  Resolved. Continue medicated mouthwash and baking soda with water as needed 5. Peripheral edema: Resolved. 6. Difficulty sleeping:  Continue Ambien as needed.  Patient expressed understanding and was in agreement with this plan. She also understands that She can call clinic at any time with any questions, concerns, or complaints.   Breast cancer   Staging form: Breast, AJCC 7th Edition     Clinical stage from 06/04/2015: Stage IA (T1c, N0, M0) - Signed by Lloyd Huger, MD on 06/04/2015   Lloyd Huger, MD   11/20/2015 7:40 AM

## 2015-11-29 ENCOUNTER — Encounter: Payer: Self-pay | Admitting: Radiation Oncology

## 2015-11-29 ENCOUNTER — Ambulatory Visit
Admission: RE | Admit: 2015-11-29 | Discharge: 2015-11-29 | Disposition: A | Discharge status: Home / Self Care | Payer: BLUE CROSS/BLUE SHIELD | Source: Ambulatory Visit | Attending: Radiation Oncology | Admitting: Radiation Oncology

## 2015-11-29 VITALS — BP 156/93 | HR 84 | Temp 97.1°F | Wt 223.9 lb

## 2015-11-29 DIAGNOSIS — Z510 Encounter for antineoplastic radiation therapy: Secondary | ICD-10-CM | POA: Insufficient documentation

## 2015-11-29 DIAGNOSIS — Z171 Estrogen receptor negative status [ER-]: Secondary | ICD-10-CM | POA: Insufficient documentation

## 2015-11-29 DIAGNOSIS — C50912 Malignant neoplasm of unspecified site of left female breast: Secondary | ICD-10-CM

## 2015-11-29 DIAGNOSIS — C50412 Malignant neoplasm of upper-outer quadrant of left female breast: Secondary | ICD-10-CM | POA: Insufficient documentation

## 2015-11-29 NOTE — Consult Note (Signed)
Except an outstanding is perfect of Radiation Oncology NEW PATIENT EVALUATION  Name: Brittany Ryan  MRN: [id number]  Date:   11/29/2015     DOB: 05-21-1961   This 54 y.o. 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  REFERRING PHYSICIAN: [name], NP  CHIEF COMPLAINT:  Chief Complaint  Patient presents with  . Breast Cancer    Initial evaluation     DIAGNOSIS: The encounter diagnosis was Malignant neoplasm of left female breast, unspecified site of breast ([place]).   PREVIOUS INVESTIGATIONS:  Pathology reports reviewed Mammograms and ultrasound reviewed Clinical notes reviewed  HPI: Patient is a 54 year old female presented with a self discovered mass in the left breast. Mammograms confirmed a suspicious 1.6 cm mass in the upper outer left breast 12:30 position 8 cm from the nipple. No evidence of lymphadenopathy was noted. This was confirmed on ultrasound and ultrasound-guided core biopsy of the mass was performed. This was positive for triple negative invasive mammary carcinoma. She underwent a wide local excision and sentinel node biopsy of the left breast showing a 1.5 mm invasive mammary carcinoma overall grade of 3 which was ER/PR negative and HER-2/neu not overexpressed. Margins were clear at 4 mm. One sentinel lymph node was negative. No lymphovascular invasion was identified. Patient on have 4 cycles of socks it Cytoxan H Meissen followed by 12 weeks of Taxol. She tolerated treatments fairly well. She has developed a mild peripheral neuropathy although continues to work 50 hours a week. She is seen today for radiation oncology consultation. She specifically denies breast tenderness or bone pain. She has a slight nonproductive cough which is developed during chemotherapy.Marland Kitchen  PLANNED TREATMENT REGIMEN:  Whole breast radiation  PAST MEDICAL HISTORY:  has a past medical history of Breast cancer ([place]); Seasonal allergies; Frequent sinus infections; and Hypertension.    PAST SURGICAL HISTORY:  Past Surgical History  Procedure Laterality Date  . Breast biopsy Left 04/27/2015    results unknown  . Mastectomy, partial Left 05/19/2015    Procedure: MASTECTOMY PARTIAL;  Surgeon: Florene Glen, MD;  Location: [facility] ORS;  Service: General;  Laterality: Left;  . Breast lumpectomy with needle localization Left 05/19/2015    Procedure: BREAST LUMPECTOMY WITH NEEDLE LOCALIZATION;  Surgeon: Florene Glen, MD;  Location: [facility] OR[facility];  Service: General;  Laterality: Left;  . Breast lumpectomy with sentinel lymph node biopsy Left 05/19/2015    Procedure: BREAST LUMPECTOMY WITH SENTINEL LYMPH NODE BX;  Surgeon: Florene Glen, MD;  Location: [facility] ORS;  Service: General;  Laterality: Left;  . Portacath placement Left 05/19/2015    Procedure: INSERTION PORT-A-CATH;  Surgeon: Florene Glen, MD;  Location: [facility] ORS;  Service: General;  Laterality: Left;    FAMILY HISTORY: family history includes CAD in her father; Cancer in her father; Heart attack in her father; Hypertension in her father.  SOCIAL HISTORY:  reports that she has never smoked. She has never used smokeless tobacco. She reports that she drinks alcohol. She reports that she does not use illicit drugs.  ALLERGIES: Review of patient's allergies indicates no known allergies.  MEDICATIONS:  Current Outpatient Prescriptions  Medication Sig Dispense Refill  . acetaminophen (TYLENOL) 500 MG tablet Take 1,000 mg by mouth every 6 (six) hours as needed for mild pain or headache.    Marland Kitchen aspirin 325 MG tablet  Take 325 mg by mouth daily.    . B Complex Vitamins (VITAMIN B COMPLEX PO) Take 1 tablet by mouth daily as needed.    . Biotin 10 MG CAPS Take by mouth.    . Black Cohosh 20 MG TABS Take by mouth.    . Calcium Carbonate-Vitamin D (CALCIUM-VITAMIN D)  500-200 MG-UNIT per tablet Take 1 tablet by mouth daily.    . chlorpheniramine-HYDROcodone (TUSSIONEX) 10-8 MG/5ML SUER Take 5 mLs by mouth every 12 (twelve) hours as needed for cough. 140 mL 0  . fluticasone (FLONASE) 50 MCG/ACT nasal spray Place into the nose.    . lidocaine-prilocaine (EMLA) cream Apply 1 application topically as needed. Apply to port and cover with saran wrap 1-2 hours before chemotherapy 30 g 2  . loratadine (CLARITIN) 10 MG tablet Take by mouth.    . magic mouthwash SOLN Take 5 mLs by mouth 4 (four) times daily as needed for mouth pain. 480 mL 1  . Multiple Vitamin (MULTIVITAMIN) tablet Take 1 tablet by mouth daily.    . ondansetron (ZOFRAN-ODT) 4 MG disintegrating tablet Take 1 tablet (4 mg total) by mouth every 8 (eight) hours as needed for nausea or vomiting. 20 tablet 0  . polyethylene glycol powder (GLYCOLAX/MIRALAX) powder Take 1 Container by mouth once.    . prochlorperazine (COMPAZINE) 10 MG tablet Take 1 tablet (10 mg total) by mouth every 6 (six) hours as needed for nausea or vomiting. 60 tablet 0  . senna (SENOKOT) 8.6 MG TABS tablet Take 1 tablet by mouth.    . zolpidem (AMBIEN) 5 MG tablet Take 1 tablet (5 mg total) by mouth at bedtime as needed for sleep. 30 tablet 0   No current facility-administered medications for this encounter.   Facility-Administered Medications Ordered in Other Encounters  Medication Dose Route Frequency Provider Last Rate Last Dose  . heparin lock flush 100 unit/mL  500 Units Intravenous Once Lloyd Huger, MD      . heparin lock flush 100 unit/mL  500 Units Intravenous Once Lloyd Huger, MD      . heparin lock flush 100 unit/mL  500 Units Intracatheter Once PRN Lloyd Huger, MD      . sodium chloride 0.9 % injection 10 mL  10 mL Intracatheter PRN Lloyd Huger, MD   10 mL at 07/22/15 0956  . sodium chloride 0.9 % injection 10 mL  10 mL Intravenous PRN Lloyd Huger, MD   10 mL at 09/02/15 0941  . sodium  chloride 0.9 % injection 10 mL  10 mL Intravenous PRN Lloyd Huger, MD      . sodium chloride 0.9 % injection 10 mL  10 mL Intracatheter PRN Lloyd Huger, MD   10 mL at 09/23/15 1205  . sodium chloride 0.9 % injection 10 mL  10 mL Intracatheter PRN Lloyd Huger, MD   10 mL at 09/30/15 0900    ECOG PERFORMANCE STATUS:  0 - Asymptomatic  REVIEW OF SYSTEMS:  Patient denies any weight loss, fatigue, weakness, fever, chills or night sweats. Patient denies any loss of vision, blurred vision. Patient denies any ringing  of the ears or hearing loss. No irregular heartbeat. Patient denies heart murmur or history of fainting. Patient denies any chest pain or pain radiating to her upper extremities. Patient denies any shortness of breath, difficulty breathing at night, cough or hemoptysis. Patient denies any swelling in the lower legs. Patient denies any nausea vomiting, vomiting of  blood, or coffee ground material in the vomitus. Patient denies any stomach pain. Patient states has had normal bowel movements no significant constipation or diarrhea. Patient denies any dysuria, hematuria or significant nocturia. Patient denies any problems walking, swelling in the joints or loss of balance. Patient denies any skin changes, loss of hair or loss of weight. Patient denies any excessive worrying or anxiety or significant depression. Patient denies any problems with insomnia. Patient denies excessive thirst, polyuria, polydipsia. Patient denies any swollen glands, patient denies easy bruising or easy bleeding. Patient denies any recent infections, allergies or URI. Patient "s visual fields have not changed significantly in recent time.    PHYSICAL EXAM: BP 156/93 mmHg  Pulse 84  Temp(Src) 97.1 F (36.2 C)  Wt 223 lb 14 oz (101.55 kg)  LMP  (Approximate) A well-developed female in NAD. Breasts are large and pendulous. She is wide local excision of left breast which is healed well. She also has a  Port-A-Cath placed in the superior portion of the left chest wall which would not be in her treatment field. No dominant mass or nodularity is noted in either breast in 2 positions examined. No axillary or supraclavicular adenopathy is appreciated. Well-developed well-nourished patient in NAD. HEENT reveals PERLA, EOMI, discs not visualized.  Oral cavity is clear. No oral mucosal lesions are identified. Neck is clear without evidence of cervical or supraclavicular adenopathy. Lungs are clear to A&P. Cardiac examination is essentially unremarkable with regular rate and rhythm without murmur rub or thrill. Abdomen is benign with no organomegaly or masses noted. Motor sensory and DTR levels are equal and symmetric in the upper and lower extremities. Cranial nerves II through XII are grossly intact. Proprioception is intact. No peripheral adenopathy or edema is identified. No motor or sensory levels are noted. Crude visual fields are within normal range.  LABORATORY DATA: Pathology reports are reviewed    RADIOLOGY RESULTS: Mammogram and ultrasound are reviewed   IMPRESSION: Stage IA triple negative invasive mammary carcinoma of the left breast status post wide local excision and sentinel node biopsy and adjuvant chemotherapy in 54 year old female  PLAN: At the present time I would recommend whole breast radiation. She has rather large pendulous breasts making hypofractionated course of treatment difficult. I will plan on 5000 cGy to her left breast boosting her scar another 1400 cGy based on her 4 mm margin. Risks and benefits of treatment including skin reaction, fatigue, inclusion of this possible superficial lung, alteration of blood counts all were discussed in detail with the patient. She seems to comprehend my treatment plan well. I have set up and ordered CT simulation next week on the patient.  I would like to take this opportunity for allowing me to participate in the care of your  patient.Armstead Peaks., MD

## 2015-12-01 ENCOUNTER — Other Ambulatory Visit: Payer: No Typology Code available for payment source

## 2015-12-01 ENCOUNTER — Ambulatory Visit: Payer: No Typology Code available for payment source

## 2015-12-01 ENCOUNTER — Ambulatory Visit: Payer: No Typology Code available for payment source | Admitting: Oncology

## 2015-12-06 ENCOUNTER — Other Ambulatory Visit: Payer: Self-pay | Admitting: Radiation Oncology

## 2015-12-06 DIAGNOSIS — R059 Cough, unspecified: Secondary | ICD-10-CM

## 2015-12-06 DIAGNOSIS — R05 Cough: Secondary | ICD-10-CM

## 2015-12-06 MED ORDER — AZITHROMYCIN 250 MG PO TABS: ORAL_TABLET | ORAL | Status: DC

## 2015-12-07 ENCOUNTER — Ambulatory Visit
Admission: RE | Admit: 2015-12-07 | Discharge: 2015-12-07 | Disposition: A | Discharge status: Home / Self Care | Payer: BLUE CROSS/BLUE SHIELD | Source: Ambulatory Visit | Attending: Radiation Oncology | Admitting: Radiation Oncology

## 2015-12-07 ENCOUNTER — Telehealth: Payer: Self-pay | Admitting: *Deleted

## 2015-12-07 ENCOUNTER — Other Ambulatory Visit: Payer: Self-pay

## 2015-12-07 DIAGNOSIS — Z510 Encounter for antineoplastic radiation therapy: Secondary | ICD-10-CM | POA: Diagnosis not present

## 2015-12-07 DIAGNOSIS — Z171 Estrogen receptor negative status [ER-]: Secondary | ICD-10-CM | POA: Diagnosis not present

## 2015-12-07 DIAGNOSIS — R059 Cough, unspecified: Secondary | ICD-10-CM

## 2015-12-07 DIAGNOSIS — R05 Cough: Secondary | ICD-10-CM | POA: Diagnosis not present

## 2015-12-07 DIAGNOSIS — C50412 Malignant neoplasm of upper-outer quadrant of left female breast: Secondary | ICD-10-CM | POA: Diagnosis not present

## 2015-12-07 NOTE — Telephone Encounter (Signed)
Patient called with complaints of productive cough and runny nose of a thick green mucus.  She wanted to know if she should still come for simulation tomorrow.  Discussed case with Dr. Baruch Gouty.  Prescription for a Z-pack escribed to [facility] and order for a chest x-ray placed.  Patient states she will come in early in the morning for the x-ray prior to her appointment.  She is to call if she has any other questions or needs.

## 2015-12-07 NOTE — Telephone Encounter (Signed)
Per Dr. Baruch Gouty,  Patient notified of chest xray results,  It was negative.   Patient verbalized understanding of the results.

## 2015-12-09 ENCOUNTER — Other Ambulatory Visit: Payer: Self-pay | Admitting: *Deleted

## 2015-12-09 DIAGNOSIS — C50912 Malignant neoplasm of unspecified site of left female breast: Secondary | ICD-10-CM

## 2015-12-13 DIAGNOSIS — C50412 Malignant neoplasm of upper-outer quadrant of left female breast: Secondary | ICD-10-CM | POA: Diagnosis not present

## 2015-12-14 ENCOUNTER — Ambulatory Visit
Admission: RE | Admit: 2015-12-14 | Discharge: 2015-12-14 | Disposition: A | Discharge status: Home / Self Care | Payer: BLUE CROSS/BLUE SHIELD | Source: Ambulatory Visit | Attending: Radiation Oncology | Admitting: Radiation Oncology

## 2015-12-14 DIAGNOSIS — C50412 Malignant neoplasm of upper-outer quadrant of left female breast: Secondary | ICD-10-CM | POA: Diagnosis not present

## 2015-12-15 ENCOUNTER — Ambulatory Visit
Admission: RE | Admit: 2015-12-15 | Discharge: 2015-12-15 | Disposition: A | Discharge status: Home / Self Care | Payer: BLUE CROSS/BLUE SHIELD | Source: Ambulatory Visit | Attending: Radiation Oncology | Admitting: Radiation Oncology

## 2015-12-15 DIAGNOSIS — C50412 Malignant neoplasm of upper-outer quadrant of left female breast: Secondary | ICD-10-CM | POA: Diagnosis not present

## 2015-12-16 ENCOUNTER — Ambulatory Visit
Admission: RE | Admit: 2015-12-16 | Discharge: 2015-12-16 | Disposition: A | Discharge status: Home / Self Care | Payer: BLUE CROSS/BLUE SHIELD | Source: Ambulatory Visit | Attending: Radiation Oncology | Admitting: Radiation Oncology

## 2015-12-16 DIAGNOSIS — C50412 Malignant neoplasm of upper-outer quadrant of left female breast: Secondary | ICD-10-CM | POA: Diagnosis not present

## 2015-12-19 ENCOUNTER — Ambulatory Visit
Admission: RE | Admit: 2015-12-19 | Discharge: 2015-12-19 | Disposition: A | Discharge status: Home / Self Care | Payer: BLUE CROSS/BLUE SHIELD | Source: Ambulatory Visit | Attending: Radiation Oncology | Admitting: Radiation Oncology

## 2015-12-19 DIAGNOSIS — C50412 Malignant neoplasm of upper-outer quadrant of left female breast: Secondary | ICD-10-CM | POA: Diagnosis not present

## 2015-12-20 ENCOUNTER — Ambulatory Visit
Admission: RE | Admit: 2015-12-20 | Discharge: 2015-12-20 | Disposition: A | Discharge status: Home / Self Care | Payer: BLUE CROSS/BLUE SHIELD | Source: Ambulatory Visit | Attending: Radiation Oncology | Admitting: Radiation Oncology

## 2015-12-20 ENCOUNTER — Other Ambulatory Visit: Payer: Self-pay

## 2015-12-20 DIAGNOSIS — C50412 Malignant neoplasm of upper-outer quadrant of left female breast: Secondary | ICD-10-CM | POA: Diagnosis not present

## 2015-12-20 DIAGNOSIS — C50912 Malignant neoplasm of unspecified site of left female breast: Secondary | ICD-10-CM

## 2015-12-20 MED ORDER — ZOLPIDEM TARTRATE 5 MG PO TABS: 5.0000 mg | ORAL_TABLET | Freq: Every evening | ORAL | Status: DC | PRN

## 2015-12-21 ENCOUNTER — Ambulatory Visit
Admission: RE | Admit: 2015-12-21 | Discharge: 2015-12-21 | Disposition: A | Discharge status: Home / Self Care | Payer: BLUE CROSS/BLUE SHIELD | Source: Ambulatory Visit | Attending: Radiation Oncology | Admitting: Radiation Oncology

## 2015-12-21 ENCOUNTER — Inpatient Hospital Stay: Payer: BLUE CROSS/BLUE SHIELD | Attending: Oncology

## 2015-12-21 DIAGNOSIS — C50412 Malignant neoplasm of upper-outer quadrant of left female breast: Secondary | ICD-10-CM | POA: Diagnosis not present

## 2015-12-21 DIAGNOSIS — C801 Malignant (primary) neoplasm, unspecified: Secondary | ICD-10-CM

## 2015-12-21 DIAGNOSIS — Z452 Encounter for adjustment and management of vascular access device: Secondary | ICD-10-CM | POA: Diagnosis not present

## 2015-12-21 DIAGNOSIS — C50912 Malignant neoplasm of unspecified site of left female breast: Secondary | ICD-10-CM | POA: Insufficient documentation

## 2015-12-21 DIAGNOSIS — Z171 Estrogen receptor negative status [ER-]: Secondary | ICD-10-CM | POA: Insufficient documentation

## 2015-12-21 MED ORDER — HEPARIN SOD (PORK) LOCK FLUSH 100 UNIT/ML IV SOLN
500.0000 [IU] | Freq: Once | INTRAVENOUS | Status: AC
  Administered 2015-12-21: 500 [IU] via INTRAVENOUS
  Filled 2015-12-21: qty 5

## 2015-12-21 MED ORDER — SODIUM CHLORIDE 0.9 % IJ SOLN
10.0000 mL | INTRAMUSCULAR | Status: DC | PRN
  Administered 2015-12-21: 10 mL via INTRAVENOUS
  Filled 2015-12-21: qty 10

## 2015-12-22 ENCOUNTER — Ambulatory Visit
Admission: RE | Admit: 2015-12-22 | Discharge: 2015-12-22 | Disposition: A | Discharge status: Home / Self Care | Payer: BLUE CROSS/BLUE SHIELD | Source: Ambulatory Visit | Attending: Radiation Oncology | Admitting: Radiation Oncology

## 2015-12-22 DIAGNOSIS — C50412 Malignant neoplasm of upper-outer quadrant of left female breast: Secondary | ICD-10-CM | POA: Diagnosis not present

## 2015-12-23 ENCOUNTER — Ambulatory Visit
Admission: RE | Admit: 2015-12-23 | Discharge: 2015-12-23 | Disposition: A | Discharge status: Home / Self Care | Payer: BLUE CROSS/BLUE SHIELD | Source: Ambulatory Visit | Attending: Radiation Oncology | Admitting: Radiation Oncology

## 2015-12-23 DIAGNOSIS — C50412 Malignant neoplasm of upper-outer quadrant of left female breast: Secondary | ICD-10-CM | POA: Diagnosis not present

## 2015-12-26 ENCOUNTER — Ambulatory Visit
Admission: RE | Admit: 2015-12-26 | Discharge: 2015-12-26 | Disposition: A | Discharge status: Home / Self Care | Payer: BLUE CROSS/BLUE SHIELD | Source: Ambulatory Visit | Attending: Radiation Oncology | Admitting: Radiation Oncology

## 2015-12-26 DIAGNOSIS — C50412 Malignant neoplasm of upper-outer quadrant of left female breast: Secondary | ICD-10-CM | POA: Diagnosis not present

## 2015-12-27 ENCOUNTER — Ambulatory Visit
Admission: RE | Admit: 2015-12-27 | Discharge: 2015-12-27 | Disposition: A | Discharge status: Home / Self Care | Payer: BLUE CROSS/BLUE SHIELD | Source: Ambulatory Visit | Attending: Radiation Oncology | Admitting: Radiation Oncology

## 2015-12-27 DIAGNOSIS — C50412 Malignant neoplasm of upper-outer quadrant of left female breast: Secondary | ICD-10-CM | POA: Diagnosis not present

## 2015-12-28 ENCOUNTER — Ambulatory Visit
Admission: RE | Admit: 2015-12-28 | Discharge: 2015-12-28 | Disposition: A | Discharge status: Home / Self Care | Payer: BLUE CROSS/BLUE SHIELD | Source: Ambulatory Visit | Attending: Radiation Oncology | Admitting: Radiation Oncology

## 2015-12-28 ENCOUNTER — Inpatient Hospital Stay: Payer: BLUE CROSS/BLUE SHIELD

## 2015-12-28 DIAGNOSIS — C50412 Malignant neoplasm of upper-outer quadrant of left female breast: Secondary | ICD-10-CM | POA: Diagnosis not present

## 2015-12-29 ENCOUNTER — Ambulatory Visit
Admission: RE | Admit: 2015-12-29 | Discharge: 2015-12-29 | Disposition: A | Discharge status: Home / Self Care | Payer: BLUE CROSS/BLUE SHIELD | Source: Ambulatory Visit | Attending: Radiation Oncology | Admitting: Radiation Oncology

## 2015-12-29 DIAGNOSIS — C50412 Malignant neoplasm of upper-outer quadrant of left female breast: Secondary | ICD-10-CM | POA: Diagnosis not present

## 2015-12-30 ENCOUNTER — Ambulatory Visit
Admission: RE | Admit: 2015-12-30 | Discharge: 2015-12-30 | Disposition: A | Discharge status: Home / Self Care | Payer: BLUE CROSS/BLUE SHIELD | Source: Ambulatory Visit | Attending: Radiation Oncology | Admitting: Radiation Oncology

## 2015-12-30 DIAGNOSIS — C50412 Malignant neoplasm of upper-outer quadrant of left female breast: Secondary | ICD-10-CM | POA: Diagnosis not present

## 2016-01-02 ENCOUNTER — Ambulatory Visit
Admission: RE | Admit: 2016-01-02 | Discharge: 2016-01-02 | Disposition: A | Discharge status: Home / Self Care | Payer: BLUE CROSS/BLUE SHIELD | Source: Ambulatory Visit | Attending: Radiation Oncology | Admitting: Radiation Oncology

## 2016-01-02 DIAGNOSIS — C50412 Malignant neoplasm of upper-outer quadrant of left female breast: Secondary | ICD-10-CM | POA: Diagnosis not present

## 2016-01-03 ENCOUNTER — Ambulatory Visit
Admission: RE | Admit: 2016-01-03 | Discharge: 2016-01-03 | Disposition: A | Discharge status: Home / Self Care | Payer: BLUE CROSS/BLUE SHIELD | Source: Ambulatory Visit | Attending: Radiation Oncology | Admitting: Radiation Oncology

## 2016-01-03 DIAGNOSIS — C50412 Malignant neoplasm of upper-outer quadrant of left female breast: Secondary | ICD-10-CM | POA: Diagnosis not present

## 2016-01-04 ENCOUNTER — Ambulatory Visit: Payer: BLUE CROSS/BLUE SHIELD

## 2016-01-05 ENCOUNTER — Ambulatory Visit
Admission: RE | Admit: 2016-01-05 | Discharge: 2016-01-05 | Disposition: A | Discharge status: Home / Self Care | Payer: BLUE CROSS/BLUE SHIELD | Source: Ambulatory Visit | Attending: Radiation Oncology | Admitting: Radiation Oncology

## 2016-01-05 DIAGNOSIS — C50412 Malignant neoplasm of upper-outer quadrant of left female breast: Secondary | ICD-10-CM | POA: Diagnosis not present

## 2016-01-06 ENCOUNTER — Ambulatory Visit
Admission: RE | Admit: 2016-01-06 | Discharge: 2016-01-06 | Disposition: A | Discharge status: Home / Self Care | Payer: BLUE CROSS/BLUE SHIELD | Source: Ambulatory Visit | Attending: Radiation Oncology | Admitting: Radiation Oncology

## 2016-01-06 DIAGNOSIS — C50412 Malignant neoplasm of upper-outer quadrant of left female breast: Secondary | ICD-10-CM | POA: Diagnosis not present

## 2016-01-09 ENCOUNTER — Other Ambulatory Visit: Payer: Self-pay | Admitting: Oncology

## 2016-01-09 ENCOUNTER — Telehealth: Payer: Self-pay

## 2016-01-09 ENCOUNTER — Ambulatory Visit
Admission: RE | Admit: 2016-01-09 | Discharge: 2016-01-09 | Disposition: A | Discharge status: Home / Self Care | Payer: BLUE CROSS/BLUE SHIELD | Source: Ambulatory Visit | Attending: Radiation Oncology | Admitting: Radiation Oncology

## 2016-01-09 DIAGNOSIS — C50412 Malignant neoplasm of upper-outer quadrant of left female breast: Secondary | ICD-10-CM | POA: Diagnosis not present

## 2016-01-09 MED ORDER — GABAPENTIN 300 MG PO CAPS: 300.0000 mg | ORAL_CAPSULE | Freq: Every day | ORAL | Status: DC

## 2016-01-09 NOTE — Telephone Encounter (Signed)
-----   Message from Daiva Huge, RN sent at 01/09/2016  8:45 AM EST ----- Regarding: Neuropathy Brittany Ryan is complaining of neuropathy.  She has been taking Vitamin B complex but it is not helping at all.  She is interested in possibly trying an Rx like gabapentin, but she'd like lowest dose possible.   Could you please check with Dr. Grayland Ormond about this for her and either let me know or give her a call if you are going to call something in for her.    Thanks,   [facility]

## 2016-01-09 NOTE — Telephone Encounter (Signed)
Start with gabapentin 300mg  daily.

## 2016-01-09 NOTE — Telephone Encounter (Signed)
rx sent to pharmacy

## 2016-01-10 ENCOUNTER — Ambulatory Visit
Admission: RE | Admit: 2016-01-10 | Discharge: 2016-01-10 | Disposition: A | Discharge status: Home / Self Care | Payer: BLUE CROSS/BLUE SHIELD | Source: Ambulatory Visit | Attending: Radiation Oncology | Admitting: Radiation Oncology

## 2016-01-10 DIAGNOSIS — C50412 Malignant neoplasm of upper-outer quadrant of left female breast: Secondary | ICD-10-CM | POA: Diagnosis not present

## 2016-01-11 ENCOUNTER — Ambulatory Visit
Admission: RE | Admit: 2016-01-11 | Discharge: 2016-01-11 | Disposition: A | Discharge status: Home / Self Care | Payer: BLUE CROSS/BLUE SHIELD | Source: Ambulatory Visit | Attending: Radiation Oncology | Admitting: Radiation Oncology

## 2016-01-11 ENCOUNTER — Inpatient Hospital Stay: Payer: BLUE CROSS/BLUE SHIELD | Attending: Radiation Oncology

## 2016-01-11 DIAGNOSIS — C50912 Malignant neoplasm of unspecified site of left female breast: Secondary | ICD-10-CM | POA: Insufficient documentation

## 2016-01-11 DIAGNOSIS — C50412 Malignant neoplasm of upper-outer quadrant of left female breast: Secondary | ICD-10-CM | POA: Diagnosis not present

## 2016-01-11 LAB — CBC
HCT: 37.2 % (ref 35.0–47.0)
HEMOGLOBIN: 12.8 g/dL (ref 12.0–16.0)
MCH: 32.1 pg (ref 26.0–34.0)
MCHC: 34.3 g/dL (ref 32.0–36.0)
MCV: 93.6 fL (ref 80.0–100.0)
PLATELETS: 219 10*3/uL (ref 150–440)
RBC: 3.98 MIL/uL (ref 3.80–5.20)
RDW: 13.3 % (ref 11.5–14.5)
WBC: 4.2 10*3/uL (ref 3.6–11.0)

## 2016-01-11 LAB — COMPREHENSIVE METABOLIC PANEL
ALBUMIN: 3.9 g/dL (ref 3.5–5.0)
ALK PHOS: 65 U/L (ref 38–126)
ALT: 22 U/L (ref 14–54)
AST: 21 U/L (ref 15–41)
Anion gap: 8 (ref 5–15)
BUN: 17 mg/dL (ref 6–20)
CALCIUM: 9.1 mg/dL (ref 8.9–10.3)
CHLORIDE: 101 mmol/L (ref 101–111)
CO2: 27 mmol/L (ref 22–32)
CREATININE: 0.78 mg/dL (ref 0.44–1.00)
GFR calc Af Amer: >60 mL/min (ref >60)
GFR calc non Af Amer: >60 mL/min (ref >60)
GLUCOSE: 96 mg/dL (ref 65–99)
Potassium: 3.8 mmol/L (ref 3.5–5.1)
SODIUM: 136 mmol/L (ref 135–145)
Total Bilirubin: 0.6 mg/dL (ref 0.3–1.2)
Total Protein: 6.9 g/dL (ref 6.5–8.1)

## 2016-01-12 ENCOUNTER — Ambulatory Visit
Admission: RE | Admit: 2016-01-12 | Discharge: 2016-01-12 | Disposition: A | Discharge status: Home / Self Care | Payer: BLUE CROSS/BLUE SHIELD | Source: Ambulatory Visit | Attending: Radiation Oncology | Admitting: Radiation Oncology

## 2016-01-12 ENCOUNTER — Inpatient Hospital Stay: Payer: BLUE CROSS/BLUE SHIELD | Admitting: Oncology

## 2016-01-12 ENCOUNTER — Ambulatory Visit: Payer: BLUE CROSS/BLUE SHIELD

## 2016-01-12 DIAGNOSIS — C50412 Malignant neoplasm of upper-outer quadrant of left female breast: Secondary | ICD-10-CM | POA: Diagnosis not present

## 2016-01-13 ENCOUNTER — Ambulatory Visit: Payer: BLUE CROSS/BLUE SHIELD

## 2016-01-13 ENCOUNTER — Ambulatory Visit
Admission: RE | Admit: 2016-01-13 | Discharge: 2016-01-13 | Disposition: A | Discharge status: Home / Self Care | Payer: BLUE CROSS/BLUE SHIELD | Source: Ambulatory Visit | Attending: Radiation Oncology | Admitting: Radiation Oncology

## 2016-01-13 DIAGNOSIS — C50412 Malignant neoplasm of upper-outer quadrant of left female breast: Secondary | ICD-10-CM | POA: Diagnosis not present

## 2016-01-16 ENCOUNTER — Ambulatory Visit: Payer: BLUE CROSS/BLUE SHIELD

## 2016-01-17 ENCOUNTER — Ambulatory Visit
Admission: RE | Admit: 2016-01-17 | Discharge: 2016-01-17 | Disposition: A | Discharge status: Home / Self Care | Payer: BLUE CROSS/BLUE SHIELD | Source: Ambulatory Visit | Attending: Radiation Oncology | Admitting: Radiation Oncology

## 2016-01-17 DIAGNOSIS — C50412 Malignant neoplasm of upper-outer quadrant of left female breast: Secondary | ICD-10-CM | POA: Diagnosis not present

## 2016-01-18 ENCOUNTER — Ambulatory Visit
Admission: RE | Admit: 2016-01-18 | Discharge: 2016-01-18 | Disposition: A | Discharge status: Home / Self Care | Payer: BLUE CROSS/BLUE SHIELD | Source: Ambulatory Visit | Attending: Radiation Oncology | Admitting: Radiation Oncology

## 2016-01-18 DIAGNOSIS — C50412 Malignant neoplasm of upper-outer quadrant of left female breast: Secondary | ICD-10-CM | POA: Diagnosis not present

## 2016-01-19 ENCOUNTER — Ambulatory Visit
Admission: RE | Admit: 2016-01-19 | Discharge: 2016-01-19 | Disposition: A | Discharge status: Home / Self Care | Payer: BLUE CROSS/BLUE SHIELD | Source: Ambulatory Visit | Attending: Radiation Oncology | Admitting: Radiation Oncology

## 2016-01-19 DIAGNOSIS — C50412 Malignant neoplasm of upper-outer quadrant of left female breast: Secondary | ICD-10-CM | POA: Diagnosis not present

## 2016-01-20 ENCOUNTER — Other Ambulatory Visit: Payer: Self-pay | Admitting: *Deleted

## 2016-01-20 ENCOUNTER — Ambulatory Visit
Admission: RE | Admit: 2016-01-20 | Discharge: 2016-01-20 | Disposition: A | Discharge status: Home / Self Care | Payer: BLUE CROSS/BLUE SHIELD | Source: Ambulatory Visit | Attending: Radiation Oncology | Admitting: Radiation Oncology

## 2016-01-20 DIAGNOSIS — C50412 Malignant neoplasm of upper-outer quadrant of left female breast: Secondary | ICD-10-CM

## 2016-01-20 MED ORDER — SILVER SULFADIAZINE 1 % EX CREA: 1.0000 "application " | TOPICAL_CREAM | Freq: Two times a day (BID) | CUTANEOUS | Status: DC

## 2016-01-23 ENCOUNTER — Ambulatory Visit
Admission: RE | Admit: 2016-01-23 | Discharge: 2016-01-23 | Disposition: A | Discharge status: Home / Self Care | Payer: BLUE CROSS/BLUE SHIELD | Source: Ambulatory Visit | Attending: Radiation Oncology | Admitting: Radiation Oncology

## 2016-01-23 DIAGNOSIS — C50412 Malignant neoplasm of upper-outer quadrant of left female breast: Secondary | ICD-10-CM | POA: Diagnosis not present

## 2016-01-24 ENCOUNTER — Ambulatory Visit: Payer: BLUE CROSS/BLUE SHIELD

## 2016-01-24 ENCOUNTER — Ambulatory Visit: Payer: BLUE CROSS/BLUE SHIELD | Admitting: Radiation Oncology

## 2016-01-24 DIAGNOSIS — C50412 Malignant neoplasm of upper-outer quadrant of left female breast: Secondary | ICD-10-CM | POA: Diagnosis not present

## 2016-01-25 ENCOUNTER — Inpatient Hospital Stay: Payer: BLUE CROSS/BLUE SHIELD

## 2016-01-25 ENCOUNTER — Ambulatory Visit: Payer: BLUE CROSS/BLUE SHIELD | Admitting: Radiation Oncology

## 2016-01-25 ENCOUNTER — Ambulatory Visit: Payer: BLUE CROSS/BLUE SHIELD

## 2016-01-25 DIAGNOSIS — C50412 Malignant neoplasm of upper-outer quadrant of left female breast: Secondary | ICD-10-CM | POA: Diagnosis not present

## 2016-01-26 ENCOUNTER — Ambulatory Visit: Payer: BLUE CROSS/BLUE SHIELD | Admitting: Radiation Oncology

## 2016-01-26 DIAGNOSIS — C50412 Malignant neoplasm of upper-outer quadrant of left female breast: Secondary | ICD-10-CM | POA: Diagnosis not present

## 2016-01-27 ENCOUNTER — Ambulatory Visit: Payer: BLUE CROSS/BLUE SHIELD | Admitting: Radiation Oncology

## 2016-01-27 DIAGNOSIS — C50412 Malignant neoplasm of upper-outer quadrant of left female breast: Secondary | ICD-10-CM | POA: Diagnosis not present

## 2016-01-30 ENCOUNTER — Ambulatory Visit: Payer: BLUE CROSS/BLUE SHIELD | Admitting: Radiation Oncology

## 2016-01-30 DIAGNOSIS — C50412 Malignant neoplasm of upper-outer quadrant of left female breast: Secondary | ICD-10-CM | POA: Diagnosis not present

## 2016-01-31 ENCOUNTER — Ambulatory Visit: Payer: BLUE CROSS/BLUE SHIELD | Admitting: Radiation Oncology

## 2016-01-31 DIAGNOSIS — C50412 Malignant neoplasm of upper-outer quadrant of left female breast: Secondary | ICD-10-CM | POA: Diagnosis not present

## 2016-02-01 ENCOUNTER — Ambulatory Visit: Payer: BLUE CROSS/BLUE SHIELD | Admitting: Radiation Oncology

## 2016-02-01 ENCOUNTER — Other Ambulatory Visit: Payer: Self-pay

## 2016-02-01 DIAGNOSIS — C50912 Malignant neoplasm of unspecified site of left female breast: Secondary | ICD-10-CM

## 2016-02-01 DIAGNOSIS — C50412 Malignant neoplasm of upper-outer quadrant of left female breast: Secondary | ICD-10-CM | POA: Diagnosis not present

## 2016-02-01 MED ORDER — ZOLPIDEM TARTRATE 5 MG PO TABS: 5.0000 mg | ORAL_TABLET | Freq: Every evening | ORAL | Status: DC | PRN

## 2016-02-02 ENCOUNTER — Ambulatory Visit: Payer: BLUE CROSS/BLUE SHIELD | Admitting: Radiation Oncology

## 2016-02-02 DIAGNOSIS — C50412 Malignant neoplasm of upper-outer quadrant of left female breast: Secondary | ICD-10-CM | POA: Diagnosis not present

## 2016-02-03 ENCOUNTER — Ambulatory Visit: Payer: BLUE CROSS/BLUE SHIELD | Admitting: Radiation Oncology

## 2016-02-03 DIAGNOSIS — C50412 Malignant neoplasm of upper-outer quadrant of left female breast: Secondary | ICD-10-CM | POA: Diagnosis not present

## 2016-02-06 ENCOUNTER — Ambulatory Visit: Payer: BLUE CROSS/BLUE SHIELD | Admitting: Radiation Oncology

## 2016-02-06 ENCOUNTER — Ambulatory Visit: Payer: BLUE CROSS/BLUE SHIELD | Admitting: Oncology

## 2016-02-06 DIAGNOSIS — C50412 Malignant neoplasm of upper-outer quadrant of left female breast: Secondary | ICD-10-CM | POA: Diagnosis not present

## 2016-02-10 ENCOUNTER — Inpatient Hospital Stay: Payer: BLUE CROSS/BLUE SHIELD | Attending: Radiation Oncology | Admitting: Oncology

## 2016-02-10 ENCOUNTER — Inpatient Hospital Stay: Payer: BLUE CROSS/BLUE SHIELD

## 2016-02-10 VITALS — BP 128/74 | HR 68 | Temp 98.1°F | Ht 69.0 in | Wt 216.3 lb

## 2016-02-10 DIAGNOSIS — Z7982 Long term (current) use of aspirin: Secondary | ICD-10-CM | POA: Insufficient documentation

## 2016-02-10 DIAGNOSIS — Z9012 Acquired absence of left breast and nipple: Secondary | ICD-10-CM | POA: Insufficient documentation

## 2016-02-10 DIAGNOSIS — C50912 Malignant neoplasm of unspecified site of left female breast: Secondary | ICD-10-CM

## 2016-02-10 DIAGNOSIS — Z79899 Other long term (current) drug therapy: Secondary | ICD-10-CM | POA: Diagnosis not present

## 2016-02-10 DIAGNOSIS — Z171 Estrogen receptor negative status [ER-]: Secondary | ICD-10-CM | POA: Insufficient documentation

## 2016-02-10 DIAGNOSIS — I10 Essential (primary) hypertension: Secondary | ICD-10-CM | POA: Insufficient documentation

## 2016-02-10 DIAGNOSIS — Z9221 Personal history of antineoplastic chemotherapy: Secondary | ICD-10-CM

## 2016-02-10 DIAGNOSIS — G629 Polyneuropathy, unspecified: Secondary | ICD-10-CM

## 2016-02-10 DIAGNOSIS — F419 Anxiety disorder, unspecified: Secondary | ICD-10-CM | POA: Diagnosis not present

## 2016-02-10 LAB — CBC WITH DIFFERENTIAL/PLATELET
BASOS PCT: 1 %
Basophils Absolute: 0.1 10*3/uL (ref 0–0.1)
EOS PCT: 6 %
Eosinophils Absolute: 0.3 10*3/uL (ref 0–0.7)
HCT: 38.4 % (ref 35.0–47.0)
Hemoglobin: 12.8 g/dL (ref 12.0–16.0)
Lymphocytes Relative: 17 %
Lymphs Abs: 0.8 10*3/uL — ABNORMAL LOW (ref 1.0–3.6)
MCH: 30.9 pg (ref 26.0–34.0)
MCHC: 33.5 g/dL (ref 32.0–36.0)
MCV: 92.3 fL (ref 80.0–100.0)
MONO ABS: 0.5 10*3/uL (ref 0.2–0.9)
Monocytes Relative: 10 %
NEUTROS ABS: 3 10*3/uL (ref 1.4–6.5)
Neutrophils Relative %: 66 %
PLATELETS: 218 10*3/uL (ref 150–440)
RBC: 4.16 MIL/uL (ref 3.80–5.20)
RDW: 12.9 % (ref 11.5–14.5)
WBC: 4.6 10*3/uL (ref 3.6–11.0)

## 2016-02-10 MED ORDER — HEPARIN SOD (PORK) LOCK FLUSH 100 UNIT/ML IV SOLN
INTRAVENOUS | Status: AC
  Filled 2016-02-10: qty 5

## 2016-02-10 MED ORDER — SODIUM CHLORIDE 0.9% FLUSH
10.0000 mL | INTRAVENOUS | Status: DC | PRN
  Filled 2016-02-10: qty 10

## 2016-02-10 MED ORDER — HEPARIN SOD (PORK) LOCK FLUSH 100 UNIT/ML IV SOLN: 500.0000 [IU] | Freq: Once | INTRAVENOUS | Status: DC

## 2016-02-10 NOTE — Progress Notes (Signed)
Patient does not have an improvement with the neuropathy since starting Gabapentin.  The neuropathy is worse during the night and sometimes is a throbbing sensation when tying down.

## 2016-02-11 LAB — CA 27.29 (SERIAL MONITOR): CA 27.29: 12.7 U/mL (ref 0.0–38.6)

## 2016-02-18 NOTE — Progress Notes (Signed)
[facility]  [phone] [phone]  ID: Brittany Ryan OB: 1961-07-13  MR#: [id number]  LFY#:[id number]  Patient Care Team: [name], NP as PCP - General (Nurse Practitioner) [name], NP (Nurse Practitioner) Florene Glen, MD (Surgery)  CHIEF COMPLAINT: Breast cancer. Chief Complaint  Patient presents with  . Breast Cancer    INTERVAL HISTORY: Patient returns to clinic today for Routine 3 month evaluation. She continues to have peripheral neuropathy which is worse at night. She otherwise feels well. She has no other neurologic complaints. She denies any recent fevers or illnesses. She has a good appetite and denies weight loss. She denies any pain. She has no chest pain or shortness of breath. She denies any nausea, vomiting, constipation, or diarrhea. She has no urinary complaints. Patient offers no further specific complaints today.  REVIEW OF SYSTEMS:   Review of Systems  Constitutional: Negative.  Negative for fever and malaise/fatigue.  Respiratory: Negative.   Cardiovascular: Negative.  Negative for leg swelling.  Gastrointestinal: Negative.   Musculoskeletal: Negative.   Neurological: Positive for sensory change. Negative for weakness.  Psychiatric/Behavioral: The patient is nervous/anxious. The patient does not have insomnia.     As per HPI. Otherwise, a complete review of systems is negatve.  PAST MEDICAL HISTORY: Past Medical History  Diagnosis Date  . Breast cancer ([place])   . Seasonal allergies   . Frequent sinus infections   . Hypertension     PAST SURGICAL HISTORY: Past Surgical History  Procedure Laterality Date  . Breast biopsy Left 04/27/2015    results unknown  . Mastectomy, partial Left 05/19/2015    Procedure: MASTECTOMY PARTIAL;  Surgeon: Florene Glen, MD;  Location: [facility] ORS;  Service: General;  Laterality: Left;  . Breast lumpectomy with needle localization Left 05/19/2015    Procedure: BREAST  LUMPECTOMY WITH NEEDLE LOCALIZATION;  Surgeon: Florene Glen, MD;  Location: [facility] OR[facility];  Service: General;  Laterality: Left;  . Breast lumpectomy with sentinel lymph node biopsy Left 05/19/2015    Procedure: BREAST LUMPECTOMY WITH SENTINEL LYMPH NODE BX;  Surgeon: Florene Glen, MD;  Location: [facility] ORS;  Service: General;  Laterality: Left;  . Portacath placement Left 05/19/2015    Procedure: INSERTION PORT-A-CATH;  Surgeon: Florene Glen, MD;  Location: [facility] ORS;  Service: General;  Laterality: Left;    FAMILY HISTORY Family History  Problem Relation Age of Onset  . Cancer Father   . Hypertension Father   . CAD Father   . Heart attack Father        ADVANCED DIRECTIVES:    HEALTH MAINTENANCE: Social History  Substance Use Topics  . Smoking status: Never Smoker   . Smokeless tobacco: Never Used  . Alcohol Use: Yes     Comment: rare beer     Colonoscopy:  PAP:  Bone density:  Lipid panel:  No Known Allergies  Current Outpatient Prescriptions  Medication Sig Dispense Refill  . acetaminophen (TYLENOL) 500 MG tablet Take 1,000 mg by mouth every 6 (six) hours as needed for mild pain or headache.    Marland Kitchen aspirin 325 MG tablet Take 325 mg by mouth daily.    . B Complex Vitamins (VITAMIN B COMPLEX PO) Take 1 tablet by mouth daily as needed.    . Biotin 10 MG CAPS Take by mouth.    . Calcium Carbonate-Vitamin D (CALCIUM-VITAMIN D) 500-200 MG-UNIT per tablet Take 1 tablet by mouth daily.    . fluticasone (FLONASE) 50 MCG/ACT  nasal spray Place into the nose.    . gabapentin (NEURONTIN) 300 MG capsule Take 1 capsule (300 mg total) by mouth at bedtime. 30 capsule 1  . lidocaine-prilocaine (EMLA) cream Apply 1 application topically as needed. Apply to port and cover with saran wrap 1-2 hours before chemotherapy 30 g 2  . loratadine (CLARITIN) 10 MG tablet Take by mouth.    . Multiple Vitamin (MULTIVITAMIN) tablet Take 1 tablet by mouth daily.    . polyethylene glycol powder  (GLYCOLAX/MIRALAX) powder Take 1 Container by mouth once.    . senna (SENOKOT) 8.6 MG TABS tablet Take 1 tablet by mouth.    . zolpidem (AMBIEN) 5 MG tablet Take 1 tablet (5 mg total) by mouth at bedtime as needed for sleep. 30 tablet 0   No current facility-administered medications for this visit.   Facility-Administered Medications Ordered in Other Visits  Medication Dose Route Frequency Provider Last Rate Last Dose  . heparin lock flush 100 unit/mL  500 Units Intravenous Once Lloyd Huger, MD      . heparin lock flush 100 unit/mL  500 Units Intravenous Once Lloyd Huger, MD      . heparin lock flush 100 unit/mL  500 Units Intracatheter Once PRN Lloyd Huger, MD      . sodium chloride 0.9 % injection 10 mL  10 mL Intracatheter PRN Lloyd Huger, MD   10 mL at 07/22/15 0956  . sodium chloride 0.9 % injection 10 mL  10 mL Intravenous PRN Lloyd Huger, MD   10 mL at 09/02/15 0941  . sodium chloride 0.9 % injection 10 mL  10 mL Intravenous PRN Lloyd Huger, MD      . sodium chloride 0.9 % injection 10 mL  10 mL Intracatheter PRN Lloyd Huger, MD   10 mL at 09/23/15 1205  . sodium chloride 0.9 % injection 10 mL  10 mL Intracatheter PRN Lloyd Huger, MD   10 mL at 09/30/15 0900    OBJECTIVE: Filed Vitals:   02/10/16 0834  BP: 128/74  Pulse: 68  Temp: 98.1 F (36.7 C)     Body mass index is 31.92 kg/(m^2).    ECOG FS:0 - Asymptomatic  General: Well-developed, well-nourished, no acute distress. Eyes: anicteric sclera. Breasts: Exam deferred today. Lungs: Clear to auscultation bilaterally. Heart: Regular rate and rhythm. No rubs, murmurs, or gallops. Abdomen: Soft, nontender, nondistended. No organomegaly noted, normoactive bowel sounds. Musculoskeletal: No edema, cyanosis, or clubbing. Neuro: Alert, answering all questions appropriately. Cranial nerves grossly intact. Skin: No rashes or petechiae noted. Psych: Normal affect.   LAB  RESULTS:  Lab Results  Component Value Date   NA 136 01/11/2016   K 3.8 01/11/2016   CL 101 01/11/2016   CO2 27 01/11/2016   GLUCOSE 96 01/11/2016   BUN 17 01/11/2016   CREATININE 0.78 01/11/2016   CALCIUM 9.1 01/11/2016   PROT 6.9 01/11/2016   ALBUMIN 3.9 01/11/2016   AST 21 01/11/2016   ALT 22 01/11/2016   ALKPHOS 65 01/11/2016   BILITOT 0.6 01/11/2016   GFRNONAA >60 01/11/2016   GFRAA >60 01/11/2016    Lab Results  Component Value Date   WBC 4.6 02/10/2016   NEUTROABS 3.0 02/10/2016   HGB 12.8 02/10/2016   HCT 38.4 02/10/2016   MCV 92.3 02/10/2016   PLT 218 02/10/2016     STUDIES: No results found.  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  PLAN:    1. Breast cancer: Patient completed her chemotherapy on November 07, 2015. She has also completed adjuvant XRT. No further intervention is needed. An aromatase inhibitor would not offer benefit given the triple negative status of her disease. Return to clinic in 3 months for further evaluation. 2. Anxiety: Improving, monitor.  3. Anemia: Patient's hemoglobin is now within normal limits. 4. Peripheral neuropathy: Patient has been instructed to increase gabapentin to 600 mg before bedtime.  5. Difficulty sleeping: Continue Ambien as needed.  Patient expressed understanding and was in agreement with this plan. She also understands that She can call clinic at any time with any questions, concerns, or complaints.   Breast cancer   Staging form: Breast, AJCC 7th Edition     Clinical stage from 06/04/2015: Stage IA (T1c, N0, M0) - Signed by Lloyd Huger, MD on 06/04/2015   Lloyd Huger, MD   02/18/2016 11:45 AM

## 2016-02-28 ENCOUNTER — Other Ambulatory Visit: Payer: Self-pay | Admitting: *Deleted

## 2016-02-28 MED ORDER — GABAPENTIN 300 MG PO CAPS: 600.0000 mg | ORAL_CAPSULE | Freq: Every day | ORAL | Status: DC

## 2016-03-07 ENCOUNTER — Ambulatory Visit: Payer: BLUE CROSS/BLUE SHIELD | Attending: Radiation Oncology | Admitting: Radiation Oncology

## 2016-03-23 ENCOUNTER — Inpatient Hospital Stay: Payer: BLUE CROSS/BLUE SHIELD | Attending: Radiation Oncology

## 2016-03-23 VITALS — BP 148/85 | HR 76 | Temp 96.2°F | Resp 20

## 2016-03-23 DIAGNOSIS — Z452 Encounter for adjustment and management of vascular access device: Secondary | ICD-10-CM | POA: Diagnosis not present

## 2016-03-23 DIAGNOSIS — Z853 Personal history of malignant neoplasm of breast: Secondary | ICD-10-CM | POA: Diagnosis present

## 2016-03-23 DIAGNOSIS — Z95828 Presence of other vascular implants and grafts: Secondary | ICD-10-CM

## 2016-03-23 MED ORDER — SODIUM CHLORIDE 0.9% FLUSH
10.0000 mL | INTRAVENOUS | Status: DC | PRN
  Administered 2016-03-23: 10 mL via INTRAVENOUS
  Filled 2016-03-23: qty 10

## 2016-03-23 MED ORDER — HEPARIN SOD (PORK) LOCK FLUSH 100 UNIT/ML IV SOLN
500.0000 [IU] | Freq: Once | INTRAVENOUS | Status: AC
  Administered 2016-03-23: 500 [IU] via INTRAVENOUS
  Filled 2016-03-23: qty 5

## 2016-03-23 NOTE — Progress Notes (Signed)
Survivorship Care Plan reviewed with patient with a review of treatment, side effects, and available resources.  Patient verbalized understanding and was instructed to call if any new changes develop.

## 2016-04-24 ENCOUNTER — Other Ambulatory Visit: Payer: Self-pay | Admitting: Obstetrics and Gynecology

## 2016-04-24 DIAGNOSIS — N6082 Other benign mammary dysplasias of left breast: Secondary | ICD-10-CM

## 2016-04-26 ENCOUNTER — Other Ambulatory Visit: Payer: Self-pay | Admitting: Oncology

## 2016-05-03 ENCOUNTER — Ambulatory Visit
Admission: RE | Admit: 2016-05-03 | Discharge: 2016-05-03 | Disposition: A | Discharge status: Home / Self Care | Payer: BLUE CROSS/BLUE SHIELD | Source: Ambulatory Visit | Attending: Obstetrics and Gynecology | Admitting: Obstetrics and Gynecology

## 2016-05-03 DIAGNOSIS — N6082 Other benign mammary dysplasias of left breast: Secondary | ICD-10-CM

## 2016-05-03 HISTORY — DX: Personal history of antineoplastic chemotherapy: Z92.21

## 2016-05-03 HISTORY — DX: Reserved for inherently not codable concepts without codable children: IMO0001

## 2016-05-03 HISTORY — DX: Reserved for concepts with insufficient information to code with codable children: IMO0002

## 2016-05-04 ENCOUNTER — Inpatient Hospital Stay: Payer: BLUE CROSS/BLUE SHIELD | Attending: Radiation Oncology

## 2016-05-04 DIAGNOSIS — G629 Polyneuropathy, unspecified: Secondary | ICD-10-CM | POA: Insufficient documentation

## 2016-05-04 DIAGNOSIS — F419 Anxiety disorder, unspecified: Secondary | ICD-10-CM | POA: Insufficient documentation

## 2016-05-04 DIAGNOSIS — Z803 Family history of malignant neoplasm of breast: Secondary | ICD-10-CM | POA: Insufficient documentation

## 2016-05-04 DIAGNOSIS — Z79899 Other long term (current) drug therapy: Secondary | ICD-10-CM | POA: Diagnosis not present

## 2016-05-04 DIAGNOSIS — G4700 Insomnia, unspecified: Secondary | ICD-10-CM | POA: Insufficient documentation

## 2016-05-04 DIAGNOSIS — Z923 Personal history of irradiation: Secondary | ICD-10-CM | POA: Diagnosis not present

## 2016-05-04 DIAGNOSIS — Z9221 Personal history of antineoplastic chemotherapy: Secondary | ICD-10-CM | POA: Diagnosis not present

## 2016-05-04 DIAGNOSIS — Z9012 Acquired absence of left breast and nipple: Secondary | ICD-10-CM | POA: Insufficient documentation

## 2016-05-04 DIAGNOSIS — Z7982 Long term (current) use of aspirin: Secondary | ICD-10-CM | POA: Diagnosis not present

## 2016-05-04 DIAGNOSIS — I10 Essential (primary) hypertension: Secondary | ICD-10-CM | POA: Diagnosis not present

## 2016-05-04 DIAGNOSIS — C801 Malignant (primary) neoplasm, unspecified: Secondary | ICD-10-CM

## 2016-05-04 DIAGNOSIS — Z853 Personal history of malignant neoplasm of breast: Secondary | ICD-10-CM | POA: Diagnosis present

## 2016-05-04 MED ORDER — SODIUM CHLORIDE 0.9% FLUSH
10.0000 mL | INTRAVENOUS | Status: DC | PRN
  Administered 2016-05-04: 10 mL via INTRAVENOUS
  Filled 2016-05-04: qty 10

## 2016-05-04 MED ORDER — HEPARIN SOD (PORK) LOCK FLUSH 100 UNIT/ML IV SOLN
500.0000 [IU] | Freq: Once | INTRAVENOUS | Status: AC
  Administered 2016-05-04: 500 [IU] via INTRAVENOUS

## 2016-05-09 ENCOUNTER — Other Ambulatory Visit: Payer: Self-pay | Admitting: Oncology

## 2016-05-11 ENCOUNTER — Inpatient Hospital Stay (HOSPITAL_BASED_OUTPATIENT_CLINIC_OR_DEPARTMENT_OTHER): Payer: BLUE CROSS/BLUE SHIELD | Admitting: Oncology

## 2016-05-11 ENCOUNTER — Inpatient Hospital Stay: Payer: BLUE CROSS/BLUE SHIELD

## 2016-05-11 VITALS — BP 144/82 | HR 73 | Temp 98.2°F | Ht 69.0 in | Wt 215.8 lb

## 2016-05-11 DIAGNOSIS — F419 Anxiety disorder, unspecified: Secondary | ICD-10-CM | POA: Diagnosis not present

## 2016-05-11 DIAGNOSIS — G629 Polyneuropathy, unspecified: Secondary | ICD-10-CM

## 2016-05-11 DIAGNOSIS — G4700 Insomnia, unspecified: Secondary | ICD-10-CM | POA: Diagnosis not present

## 2016-05-11 DIAGNOSIS — C50912 Malignant neoplasm of unspecified site of left female breast: Secondary | ICD-10-CM

## 2016-05-11 DIAGNOSIS — Z803 Family history of malignant neoplasm of breast: Secondary | ICD-10-CM

## 2016-05-11 DIAGNOSIS — Z853 Personal history of malignant neoplasm of breast: Secondary | ICD-10-CM

## 2016-05-11 DIAGNOSIS — Z7982 Long term (current) use of aspirin: Secondary | ICD-10-CM

## 2016-05-11 DIAGNOSIS — Z9012 Acquired absence of left breast and nipple: Secondary | ICD-10-CM

## 2016-05-11 DIAGNOSIS — Z9221 Personal history of antineoplastic chemotherapy: Secondary | ICD-10-CM

## 2016-05-11 DIAGNOSIS — Z923 Personal history of irradiation: Secondary | ICD-10-CM

## 2016-05-11 DIAGNOSIS — I10 Essential (primary) hypertension: Secondary | ICD-10-CM

## 2016-05-11 DIAGNOSIS — Z79899 Other long term (current) drug therapy: Secondary | ICD-10-CM

## 2016-05-11 LAB — CBC
HCT: 37.9 % (ref 35.0–47.0)
Hemoglobin: 13 g/dL (ref 12.0–16.0)
MCH: 31.9 pg (ref 26.0–34.0)
MCHC: 34.3 g/dL (ref 32.0–36.0)
MCV: 93.2 fL (ref 80.0–100.0)
Platelets: 242 10*3/uL (ref 150–440)
RBC: 4.07 MIL/uL (ref 3.80–5.20)
RDW: 13.5 % (ref 11.5–14.5)
WBC: 5.5 10*3/uL (ref 3.6–11.0)

## 2016-05-11 NOTE — Progress Notes (Signed)
Patient here for follow up and results. 

## 2016-05-12 LAB — CANCER ANTIGEN 27.29: CA 27.29: 17.4 U/mL (ref 0.0–38.6)

## 2016-05-20 NOTE — Progress Notes (Signed)
[place]  [phone]) [phone] Fax:(336) [phone]  ID: [name] OB: 1961/09/12  MR#: [id number]  FXT#:[id number]  Patient Care Team: [name], NP as PCP - General (Nurse Practitioner) [name], NP (Nurse Practitioner) Florene Glen, MD (Surgery)  CHIEF COMPLAINT: Breast cancer. Chief Complaint  Patient presents with  . malignant neoplasm of the left female breast  . foolow up    INTERVAL HISTORY: Patient returns to clinic today for routine 3 month evaluation. She Currently feels well and is asymptomatic. Her peripheral neuropathy has slightly improved. She has no other neurologic complaints. She denies any recent fevers or illnesses. She has a good appetite and denies weight loss. She denies any pain. She has no chest pain or shortness of breath. She denies any nausea, vomiting, constipation, or diarrhea. She has no urinary complaints. Patient offers no further specific complaints today.  REVIEW OF SYSTEMS:   Review of Systems  Constitutional: Negative.  Negative for fever and malaise/fatigue.  Respiratory: Negative.  Negative for shortness of breath.   Cardiovascular: Negative.  Negative for chest pain and leg swelling.  Gastrointestinal: Negative.   Musculoskeletal: Negative.   Neurological: Positive for sensory change. Negative for weakness.  Psychiatric/Behavioral: The patient is nervous/anxious. The patient does not have insomnia.     As per HPI. Otherwise, a complete review of systems is negatve.  PAST MEDICAL HISTORY: Past Medical History  Diagnosis Date  . Seasonal allergies   . Frequent sinus infections   . Hypertension   . Breast cancer ([place]) 2016    LT LUMPECTOMY  . Radiation 2016-17    BREAST CA  . History of chemotherapy 2016    BREAST CA    PAST SURGICAL HISTORY: Past Surgical History  Procedure Laterality Date  . Mastectomy, partial Left 05/19/2015    Procedure: MASTECTOMY PARTIAL;  Surgeon: Florene Glen,  MD;  Location: [facility] ORS;  Service: General;  Laterality: Left;  . Breast lumpectomy with needle localization Left 05/19/2015    Procedure: BREAST LUMPECTOMY WITH NEEDLE LOCALIZATION;  Surgeon: Florene Glen, MD;  Location: [facility] OR[facility];  Service: General;  Laterality: Left;  . Breast lumpectomy with sentinel lymph node biopsy Left 05/19/2015    Procedure: BREAST LUMPECTOMY WITH SENTINEL LYMPH NODE BX;  Surgeon: Florene Glen, MD;  Location: [facility] OR[facility];  Service: General;  Laterality: Left;  . Portacath placement Left 05/19/2015    Procedure: INSERTION PORT-A-CATH;  Surgeon: Florene Glen, MD;  Location: [facility] OR[facility];  Service: General;  Laterality: Left;  . Breast biopsy Left 04/27/2015    POS    FAMILY HISTORY Family History  Problem Relation Age of Onset  . Cancer Father   . Hypertension Father   . CAD Father   . Heart attack Father   . Breast cancer Cousin        ADVANCED DIRECTIVES:    HEALTH MAINTENANCE: Social History  Substance Use Topics  . Smoking status: Never Smoker   . Smokeless tobacco: Never Used  . Alcohol Use: Yes     Comment: rare beer     Colonoscopy:  PAP:  Bone density:  Lipid panel:  No Known Allergies  Current Outpatient Prescriptions  Medication Sig Dispense Refill  . acetaminophen (TYLENOL) 500 MG tablet Take 1,000 mg by mouth every 6 (six) hours as needed for mild pain or headache.    Marland Kitchen aspirin 325 MG tablet Take 325 mg by mouth daily.    . B Complex Vitamins (VITAMIN B COMPLEX PO)  Take 1 tablet by mouth daily as needed.    . Biotin 10 MG CAPS Take by mouth.    . Calcium Carbonate-Vitamin D (CALCIUM-VITAMIN D) 500-200 MG-UNIT per tablet Take 1 tablet by mouth daily.    . fluticasone (FLONASE) 50 MCG/ACT nasal spray Place into the nose.    . lidocaine-prilocaine (EMLA) cream Apply 1 application topically as needed. Apply to port and cover with saran wrap 1-2 hours before chemotherapy 30 g 2  . loratadine (CLARITIN) 10 MG tablet Take by mouth.    .  Multiple Vitamin (MULTIVITAMIN) tablet Take 1 tablet by mouth daily.    . polyethylene glycol powder (GLYCOLAX/MIRALAX) powder Take 1 Container by mouth once.    Marland Kitchen zolpidem (AMBIEN) 5 MG tablet TAKE 1 TABLET AT BEDTIME AS NEEDED FOR SLEEP 30 tablet 2  . gabapentin (NEURONTIN) 300 MG capsule Take 2 capsules (600 mg total) by mouth at bedtime. (Patient not taking: Reported on 05/11/2016) 60 capsule 0   No current facility-administered medications for this visit.   Facility-Administered Medications Ordered in Other Visits  Medication Dose Route Frequency Provider Last Rate Last Dose  . heparin lock flush 100 unit/mL  500 Units Intravenous Once Lloyd Huger, MD      . heparin lock flush 100 unit/mL  500 Units Intravenous Once Lloyd Huger, MD      . heparin lock flush 100 unit/mL  500 Units Intracatheter Once PRN Lloyd Huger, MD      . sodium chloride 0.9 % injection 10 mL  10 mL Intracatheter PRN Lloyd Huger, MD   10 mL at 07/22/15 0956  . sodium chloride 0.9 % injection 10 mL  10 mL Intravenous PRN Lloyd Huger, MD   10 mL at 09/02/15 0941  . sodium chloride 0.9 % injection 10 mL  10 mL Intravenous PRN Lloyd Huger, MD      . sodium chloride 0.9 % injection 10 mL  10 mL Intracatheter PRN Lloyd Huger, MD   10 mL at 09/23/15 1205  . sodium chloride 0.9 % injection 10 mL  10 mL Intracatheter PRN Lloyd Huger, MD   10 mL at 09/30/15 0900    OBJECTIVE: Filed Vitals:   05/11/16 0901  BP: 144/82  Pulse: 73  Temp: 98.2 F (36.8 C)     Body mass index is 31.86 kg/(m^2).    ECOG FS:0 - Asymptomatic  General: Well-developed, well-nourished, no acute distress. Eyes: anicteric sclera. Breasts: Bilateral breast and axilla without lumps or masses. Lungs: Clear to auscultation bilaterally. Heart: Regular rate and rhythm. No rubs, murmurs, or gallops. Abdomen: Soft, nontender, nondistended. No organomegaly noted, normoactive bowel  sounds. Musculoskeletal: No edema, cyanosis, or clubbing. Neuro: Alert, answering all questions appropriately. Cranial nerves grossly intact. Skin: No rashes or petechiae noted. Psych: Normal affect.   LAB RESULTS:  Lab Results  Component Value Date   NA 136 01/11/2016   K 3.8 01/11/2016   CL 101 01/11/2016   CO2 27 01/11/2016   GLUCOSE 96 01/11/2016   BUN 17 01/11/2016   CREATININE 0.78 01/11/2016   CALCIUM 9.1 01/11/2016   PROT 6.9 01/11/2016   ALBUMIN 3.9 01/11/2016   AST 21 01/11/2016   ALT 22 01/11/2016   ALKPHOS 65 01/11/2016   BILITOT 0.6 01/11/2016   GFRNONAA >60 01/11/2016   GFRAA >60 01/11/2016    Lab Results  Component Value Date   WBC 5.5 05/11/2016   NEUTROABS 3.0 02/10/2016   HGB 13.0  05/11/2016   HCT 37.9 05/11/2016   MCV 93.2 05/11/2016   PLT 242 05/11/2016     STUDIES: US Breast Ltd Uni Left Inc Axilla  05/03/2016  CLINICAL DATA:  55 year old female with history of left lumpectomy in 2016. The patient received radiation. Per the patient, her referring clinician noted an orange peel appearance of the patient's lower, inner left breast. The patient reports that she had significant blistering and burns after radiation in this region. EXAM: 2D DIGITAL DIAGNOSTIC BILATERAL MAMMOGRAM WITH CAD AND ADJUNCT TOMO ULTRASOUND LEFT BREAST COMPARISON:  Previous exam(s). ACR Breast Density Category b: There are scattered areas of fibroglandular density. FINDINGS: New posttreatment changes are identified of the left breast including diffuse skin thickening of the left breast, likely representing postradiation change. No suspicious mass, calcifications, or other abnormality is identified within either breast. Mammographic images were processed with CAD. On physical exam, no discrete mass is felt in the area of concern within the lower, inner left breast from 7-9 o'clock. Targeted ultrasound of the lower, inner left breast was performed demonstrating diffuse skin thickening  without underlying mass or other suspicious sonographic finding. The patient's diffuse skin thickening is thought secondary to radiation. IMPRESSION: No mammographic or sonographic evidence of malignancy. RECOMMENDATION: Bilateral diagnostic mammogram in 1 year. I have discussed the findings and recommendations with the patient. Results were also provided in writing at the conclusion of the visit. If applicable, a reminder letter will be sent to the patient regarding the next appointment. BI-RADS CATEGORY  2: Benign. Electronically Signed   By: Pamelia Hoit M.D.   On: 05/03/2016 10:43   Mm Diag Breast Tomo Bilateral  05/03/2016  CLINICAL DATA:  55 year old female with history of left lumpectomy in 2016. The patient received radiation. Per the patient, her referring clinician noted an orange peel appearance of the patient's lower, inner left breast. The patient reports that she had significant blistering and burns after radiation in this region. EXAM: 2D DIGITAL DIAGNOSTIC BILATERAL MAMMOGRAM WITH CAD AND ADJUNCT TOMO ULTRASOUND LEFT BREAST COMPARISON:  Previous exam(s). ACR Breast Density Category b: There are scattered areas of fibroglandular density. FINDINGS: New posttreatment changes are identified of the left breast including diffuse skin thickening of the left breast, likely representing postradiation change. No suspicious mass, calcifications, or other abnormality is identified within either breast. Mammographic images were processed with CAD. On physical exam, no discrete mass is felt in the area of concern within the lower, inner left breast from 7-9 o'clock. Targeted ultrasound of the lower, inner left breast was performed demonstrating diffuse skin thickening without underlying mass or other suspicious sonographic finding. The patient's diffuse skin thickening is thought secondary to radiation. IMPRESSION: No mammographic or sonographic evidence of malignancy. RECOMMENDATION: Bilateral diagnostic  mammogram in 1 year. I have discussed the findings and recommendations with the patient. Results were also provided in writing at the conclusion of the visit. If applicable, a reminder letter will be sent to the patient regarding the next appointment. BI-RADS CATEGORY  2: Benign. Electronically Signed   By: Pamelia Hoit M.D.   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  PLAN:    1. Breast cancer: Patient completed her chemotherapy on November 07, 2015. She has also completed adjuvant XRT. No further intervention is needed. An aromatase inhibitor would not offer benefit given the triple negative status of her disease. Her most recent mammogram on May 03, 2016  was reported as BI-RADS 2.  Return to clinic in 3 months for further evaluation. 2. Anxiety: Improving, monitor.  3. Anemia: Patient's hemoglobin is now within normal limits. 4. Peripheral neuropathy: Continue gabapentin to 600 mg before bedtime.  5. Difficulty sleeping: Continue Ambien as needed.  Patient expressed understanding and was in agreement with this plan. She also understands that She can call clinic at any time with any questions, concerns, or complaints.   Breast cancer   Staging form: Breast, AJCC 7th Edition     Clinical stage from 06/04/2015: Stage IA (T1c, N0, M0) - Signed by Lloyd Huger, MD on 06/04/2015   Lloyd Huger, MD   05/20/2016 7:48 AM

## 2016-06-15 ENCOUNTER — Inpatient Hospital Stay: Payer: BLUE CROSS/BLUE SHIELD | Attending: Radiation Oncology

## 2016-06-15 VITALS — Resp 20

## 2016-06-15 DIAGNOSIS — Z452 Encounter for adjustment and management of vascular access device: Secondary | ICD-10-CM | POA: Insufficient documentation

## 2016-06-15 DIAGNOSIS — C50912 Malignant neoplasm of unspecified site of left female breast: Secondary | ICD-10-CM | POA: Diagnosis not present

## 2016-06-15 DIAGNOSIS — Z95828 Presence of other vascular implants and grafts: Secondary | ICD-10-CM

## 2016-06-15 DIAGNOSIS — Z170 Estrogen receptor positive status [ER+]: Secondary | ICD-10-CM | POA: Diagnosis not present

## 2016-06-15 MED ORDER — SODIUM CHLORIDE 0.9% FLUSH
10.0000 mL | INTRAVENOUS | Status: DC | PRN
  Administered 2016-06-15: 10 mL via INTRAVENOUS
  Filled 2016-06-15: qty 10

## 2016-06-15 MED ORDER — HEPARIN SOD (PORK) LOCK FLUSH 100 UNIT/ML IV SOLN
500.0000 [IU] | Freq: Once | INTRAVENOUS | Status: AC
  Administered 2016-06-15: 500 [IU] via INTRAVENOUS
  Filled 2016-06-15: qty 5

## 2016-06-19 IMAGING — MG MM DIGITAL DIAGNOSTIC BILAT W/ TOMO W/ CAD
8 of 18 series · 8 of 38 positions shown · non-contrast
Comparison: Previous exam(s).

CLINICAL DATA: 55-year-old female with history of left lumpectomy
in 9139. The patient received radiation. Per the patient, her
referring clinician noted an [REDACTED] peel appearance of the patient's
lower, inner left breast. The patient reports that she had
significant blistering and burns after radiation in this region.

EXAM:
2D DIGITAL DIAGNOSTIC BILATERAL MAMMOGRAM WITH CAD AND ADJUNCT TOMO
ULTRASOUND LEFT BREAST

[L MLO (1 of 4)]
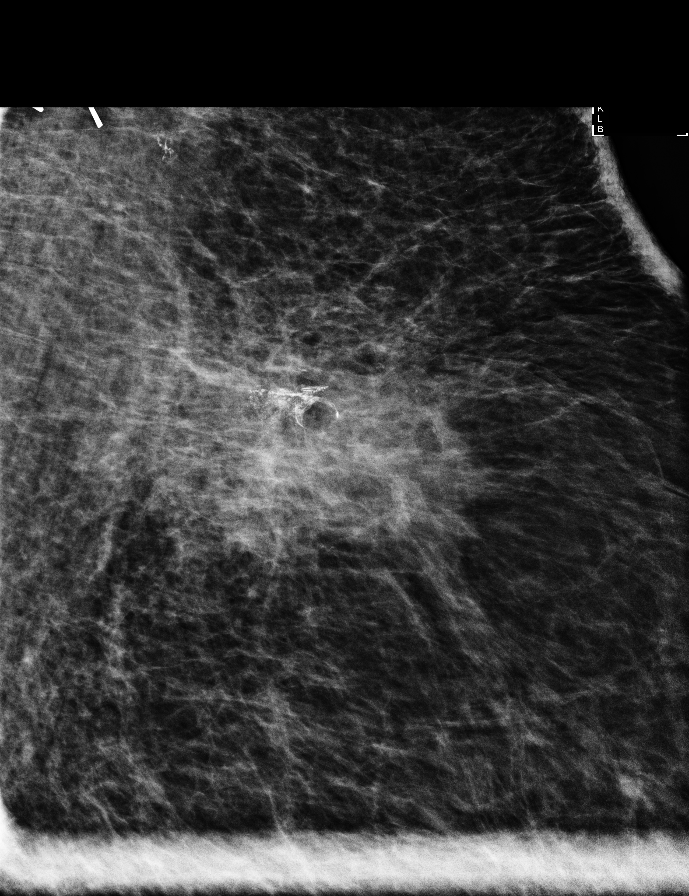

[L MLO (2 of 4)]
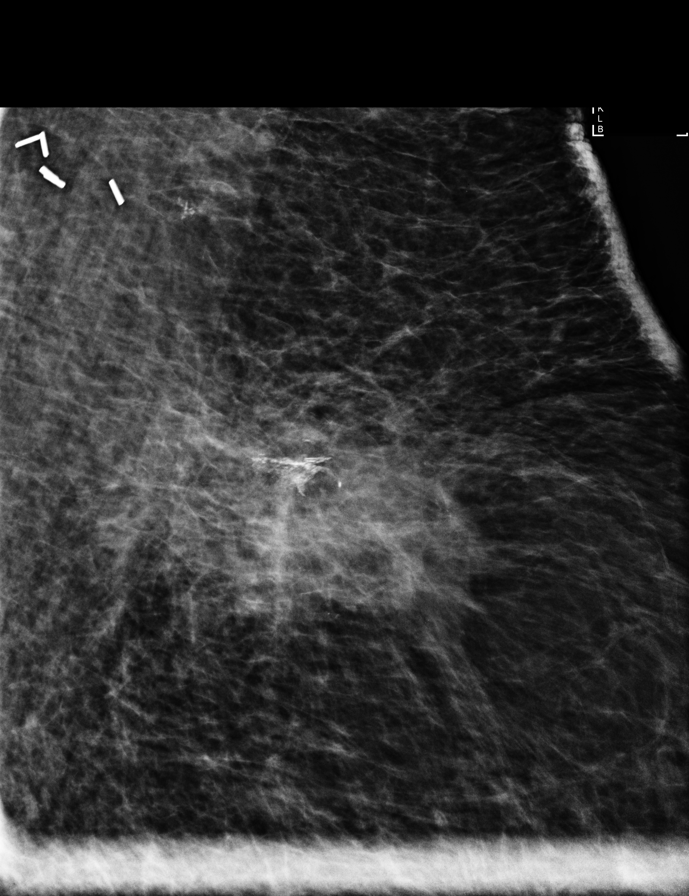

[L MLO (3 of 4)]
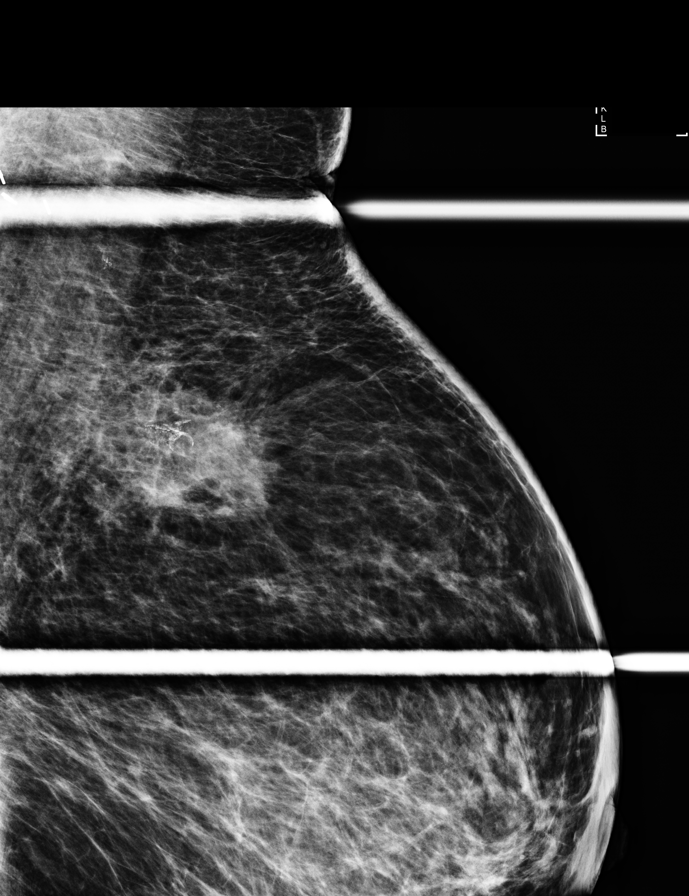

[R MLO]
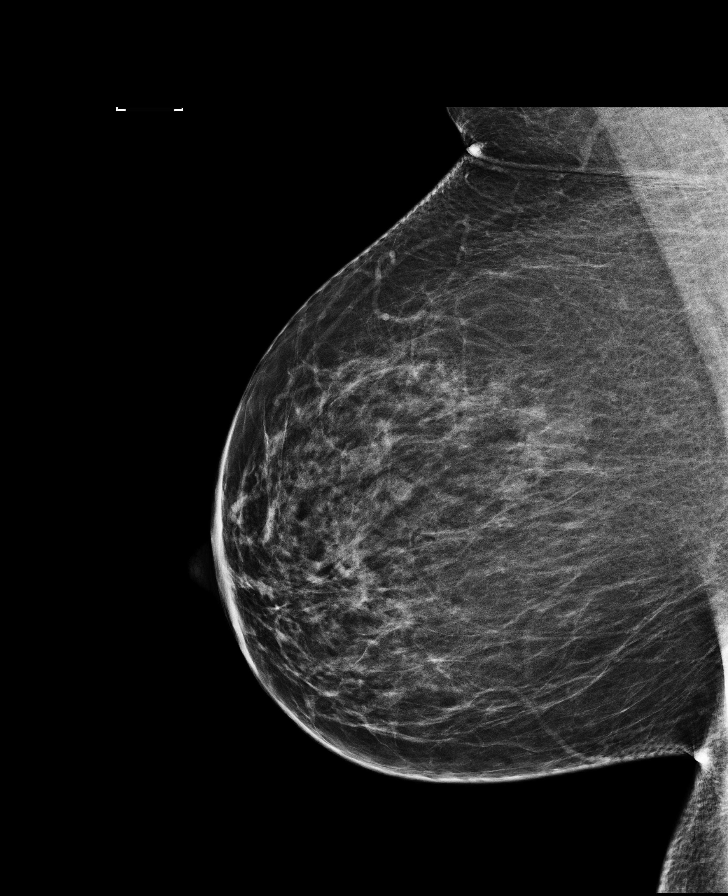

[R CC]
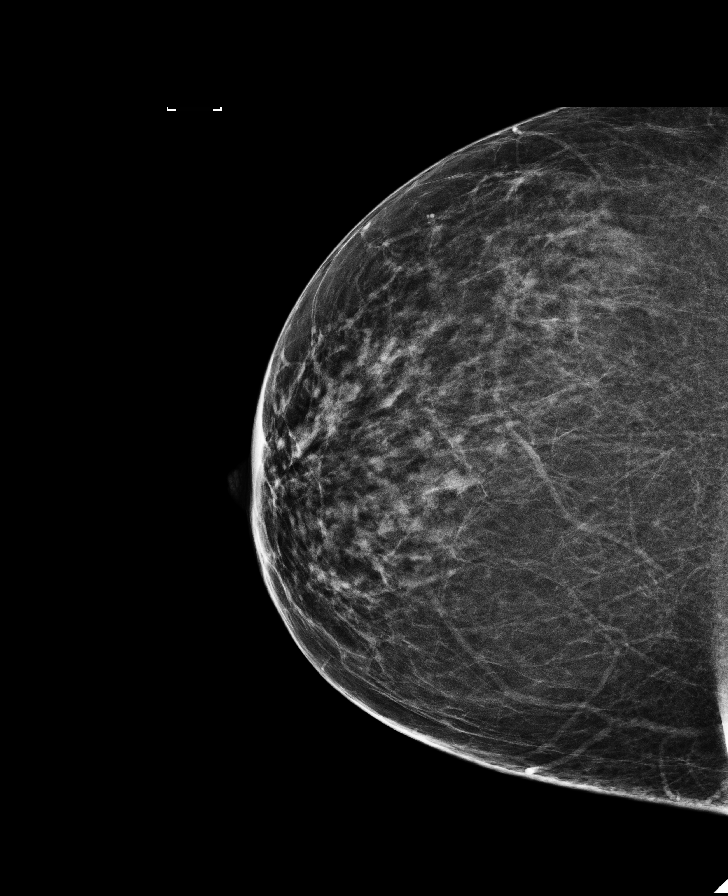

[L MLO synth-2D]
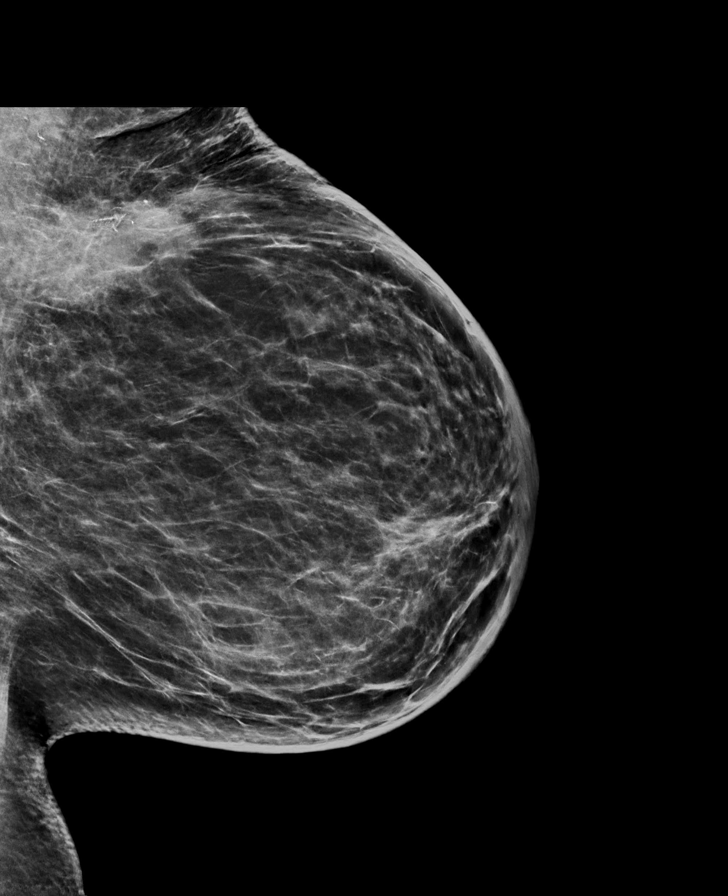

[L MLO (4 of 4)]
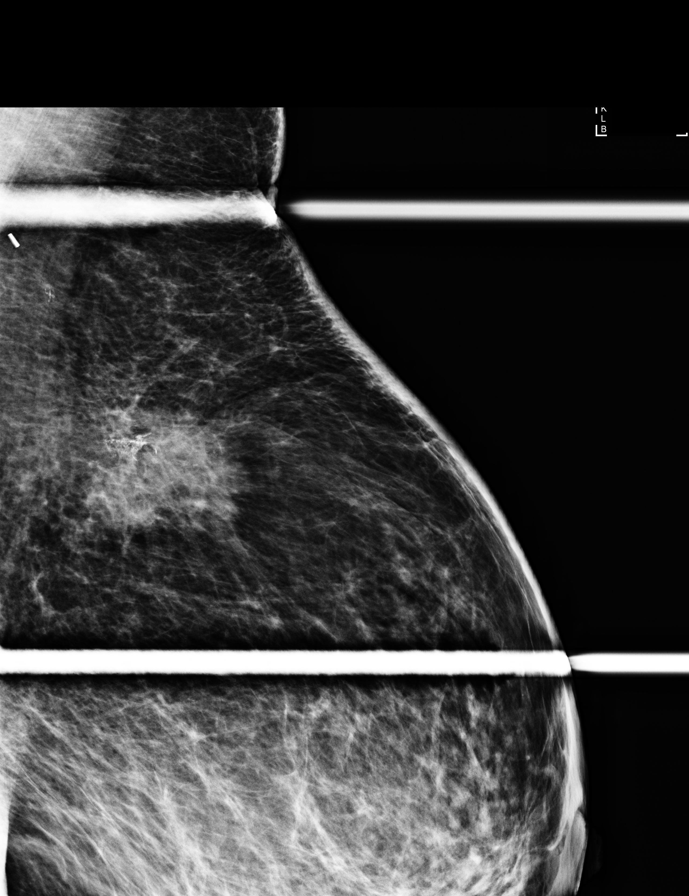

[R CC synth-2D]
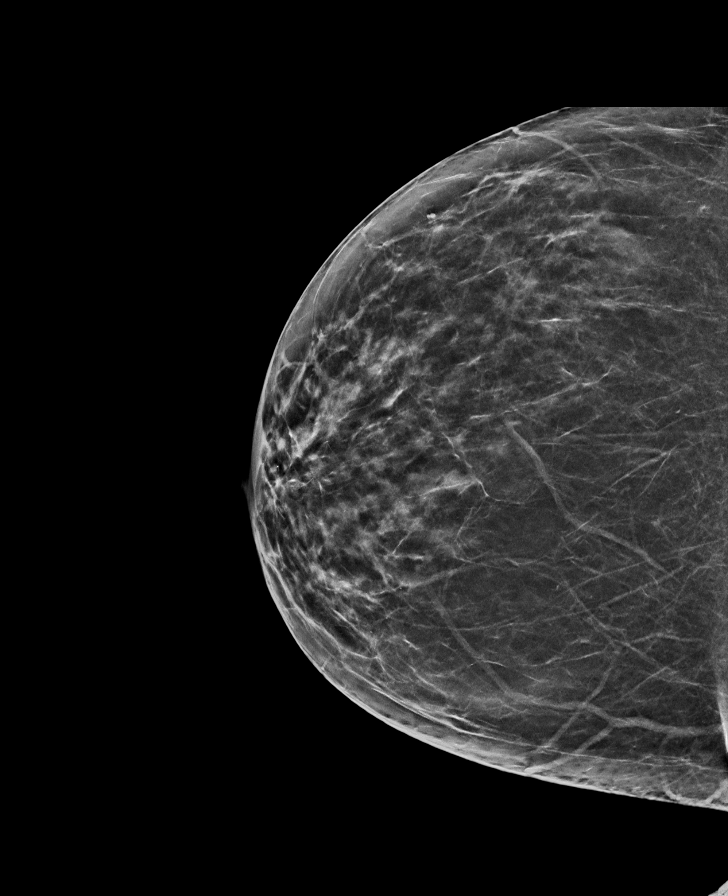

[8 of 38 positions shown; findings below may reference images not displayed]

ACR Breast Density Category b: There are scattered areas of
fibroglandular density.
FINDINGS: New posttreatment changes are identified of the left breast
including diffuse skin thickening of the left breast, likely
representing postradiation change. No suspicious mass,
calcifications, or other abnormality is identified within either
breast.

Mammographic images were processed with CAD.

On physical exam, no discrete mass is felt in the area of concern
within the lower, inner left breast from 7-9 o'clock.

Targeted ultrasound of the lower, inner left breast was performed
demonstrating diffuse skin thickening without underlying mass or
other suspicious sonographic finding. The patient's diffuse skin
thickening is thought secondary to radiation.
IMPRESSION: No mammographic or sonographic evidence of malignancy.

RECOMMENDATION:
Bilateral diagnostic mammogram in 1 year.

I have discussed the findings and recommendations with the patient.
Results were also provided in writing at the conclusion of the
visit. If applicable, a reminder letter will be sent to the patient
regarding the next appointment.

BI-RADS CATEGORY  2: Benign.

## 2016-07-27 ENCOUNTER — Inpatient Hospital Stay: Payer: BLUE CROSS/BLUE SHIELD | Attending: Radiation Oncology

## 2016-07-27 DIAGNOSIS — Z171 Estrogen receptor negative status [ER-]: Secondary | ICD-10-CM | POA: Insufficient documentation

## 2016-07-27 DIAGNOSIS — C50912 Malignant neoplasm of unspecified site of left female breast: Secondary | ICD-10-CM | POA: Insufficient documentation

## 2016-07-27 DIAGNOSIS — Z452 Encounter for adjustment and management of vascular access device: Secondary | ICD-10-CM | POA: Insufficient documentation

## 2016-07-27 DIAGNOSIS — Z95828 Presence of other vascular implants and grafts: Secondary | ICD-10-CM

## 2016-07-27 MED ORDER — SODIUM CHLORIDE 0.9% FLUSH
10.0000 mL | INTRAVENOUS | Status: DC | PRN
  Administered 2016-07-27: 10 mL via INTRAVENOUS
  Filled 2016-07-27: qty 10

## 2016-07-27 MED ORDER — HEPARIN SOD (PORK) LOCK FLUSH 100 UNIT/ML IV SOLN
INTRAVENOUS | Status: AC
  Filled 2016-07-27: qty 5

## 2016-07-27 MED ORDER — HEPARIN SOD (PORK) LOCK FLUSH 100 UNIT/ML IV SOLN
500.0000 [IU] | Freq: Once | INTRAVENOUS | Status: AC
  Administered 2016-07-27: 500 [IU] via INTRAVENOUS

## 2016-08-23 NOTE — Progress Notes (Signed)
[place]  Telephone:(336) [phone] Fax:(336) [phone]  ID: Brittany Ryan OB: 13-Jun-1961  MR#: [id number]  OIZ#:[id number]  Patient Care Team: [name], NP as PCP - General (Nurse Practitioner) [name], NP (Nurse Practitioner) Florene Glen, MD (Surgery)  CHIEF COMPLAINT: Pathologic stage Ia triple negative adenocarcinoma of the upper outer quadrant of the left breast, BRCA 1 and 2 negative.  INTERVAL HISTORY: Patient returns to clinic today for routine 3 month evaluation. She continues to feel well and is asymptomatic. Her peripheral neuropathy is unchanged, but improved with gabapentin. She has no other neurologic complaints. She denies any recent fevers or illnesses. She has a good appetite and denies weight loss. She denies any pain. She has no chest pain or shortness of breath. She denies any nausea, vomiting, constipation, or diarrhea. She has no urinary complaints. Patient offers no further specific complaints today.  REVIEW OF SYSTEMS:   Review of Systems  Constitutional: Negative.  Negative for fever and malaise/fatigue.  Respiratory: Negative.  Negative for shortness of breath.   Cardiovascular: Negative.  Negative for chest pain and leg swelling.  Gastrointestinal: Negative.   Musculoskeletal: Negative.   Neurological: Positive for sensory change. Negative for weakness.  Endo/Heme/Allergies: Does not bruise/bleed easily.  Psychiatric/Behavioral: The patient is not nervous/anxious and does not have insomnia.     As per HPI. Otherwise, a complete review of systems is negative.  PAST MEDICAL HISTORY: Past Medical History:  Diagnosis Date  . Breast cancer ([place]) 2016   LT LUMPECTOMY  . Frequent sinus infections   . History of chemotherapy 2016   BREAST CA  . Hypertension   . Radiation 2016-17   BREAST CA  . Seasonal allergies     PAST SURGICAL HISTORY: Past Surgical History:  Procedure Laterality Date  . BREAST BIOPSY Left 04/27/2015    POS  . BREAST LUMPECTOMY WITH NEEDLE LOCALIZATION Left 05/19/2015   Procedure: BREAST LUMPECTOMY WITH NEEDLE LOCALIZATION;  Surgeon: Florene Glen, MD;  Location: [facility];  Service: General;  Laterality: Left;  . BREAST LUMPECTOMY WITH SENTINEL LYMPH NODE BIOPSY Left 05/19/2015   Procedure: BREAST LUMPECTOMY WITH SENTINEL LYMPH NODE BX;  Surgeon: Florene Glen, MD;  Location: [facility];  Service: General;  Laterality: Left;  Marland Kitchen MASTECTOMY, PARTIAL Left 05/19/2015   Procedure: MASTECTOMY PARTIAL;  Surgeon: Florene Glen, MD;  Location: [facility];  Service: General;  Laterality: Left;  . PORTACATH PLACEMENT Left 05/19/2015   Procedure: INSERTION PORT-A-CATH;  Surgeon: Florene Glen, MD;  Location: [facility];  Service: General;  Laterality: Left;    FAMILY HISTORY Family History  Problem Relation Age of Onset  . Cancer Father   . Hypertension Father   . CAD Father   . Heart attack Father   . Breast cancer Cousin        ADVANCED DIRECTIVES:    HEALTH MAINTENANCE: Social History  Substance Use Topics  . Smoking status: Never Smoker  . Smokeless tobacco: Never Used  . Alcohol use Yes     Comment: rare beer     Colonoscopy:  PAP:  Bone density:  Lipid panel:  No Known Allergies  Current Outpatient Prescriptions  Medication Sig Dispense Refill  . acetaminophen (TYLENOL) 500 MG tablet Take 1,000 mg by mouth every 6 (six) hours as needed for mild pain or headache.    Marland Kitchen aspirin 325 MG tablet Take 325 mg by mouth daily.    . B Complex Vitamins (VITAMIN B COMPLEX PO) Take 1  tablet by mouth daily as needed.    . Biotin 10 MG CAPS Take by mouth.    . Calcium Carbonate-Vitamin D (CALCIUM-VITAMIN D) 500-200 MG-UNIT per tablet Take 1 tablet by mouth daily.    Marland Kitchen gabapentin (NEURONTIN) 300 MG capsule Take 2 capsules (600 mg total) by mouth at bedtime. 60 capsule 3  . lidocaine-prilocaine (EMLA) cream Apply 1 application topically as needed. Apply to port and cover with saran wrap  1-2 hours before chemotherapy 30 g 2  . loratadine (CLARITIN) 10 MG tablet Take by mouth.    . Multiple Vitamin (MULTIVITAMIN) tablet Take 1 tablet by mouth daily.    . polyethylene glycol powder (GLYCOLAX/MIRALAX) powder Take 1 Container by mouth once.    Marland Kitchen zolpidem (AMBIEN) 5 MG tablet Take 1 tablet (5 mg total) by mouth at bedtime as needed. for sleep 30 tablet 3  . fluticasone (FLONASE) 50 MCG/ACT nasal spray Place into the nose.     No current facility-administered medications for this visit.    Facility-Administered Medications Ordered in Other Visits  Medication Dose Route Frequency Provider Last Rate Last Dose  . heparin lock flush 100 unit/mL  500 Units Intravenous Once Lloyd Huger, MD      . heparin lock flush 100 unit/mL  500 Units Intravenous Once Lloyd Huger, MD      . heparin lock flush 100 unit/mL  500 Units Intracatheter Once PRN Lloyd Huger, MD      . sodium chloride 0.9 % injection 10 mL  10 mL Intracatheter PRN Lloyd Huger, MD   10 mL at 07/22/15 0956  . sodium chloride 0.9 % injection 10 mL  10 mL Intravenous PRN Lloyd Huger, MD   10 mL at 09/02/15 0941  . sodium chloride 0.9 % injection 10 mL  10 mL Intravenous PRN Lloyd Huger, MD      . sodium chloride 0.9 % injection 10 mL  10 mL Intracatheter PRN Lloyd Huger, MD   10 mL at 09/23/15 1205  . sodium chloride 0.9 % injection 10 mL  10 mL Intracatheter PRN Lloyd Huger, MD   10 mL at 09/30/15 0900    OBJECTIVE: Vitals:   08/24/16 0913  BP: (!) 144/93  Pulse: 90  Resp: 20  Temp: (!) 96.9 F (36.1 C)     Body mass index is 31.68 kg/m.    ECOG FS:0 - Asymptomatic  General: Well-developed, well-nourished, no acute distress. Eyes: anicteric sclera. Breasts: Bilateral breast and axilla without lumps or masses. Lungs: Clear to auscultation bilaterally. Heart: Regular rate and rhythm. No rubs, murmurs, or gallops. Abdomen: Soft, nontender, nondistended. No  organomegaly noted, normoactive bowel sounds. Musculoskeletal: No edema, cyanosis, or clubbing. Neuro: Alert, answering all questions appropriately. Cranial nerves grossly intact. Skin: No rashes or petechiae noted. Psych: Normal affect.   LAB RESULTS:  Lab Results  Component Value Date   NA 136 01/11/2016   K 3.8 01/11/2016   CL 101 01/11/2016   CO2 27 01/11/2016   GLUCOSE 96 01/11/2016   BUN 17 01/11/2016   CREATININE 0.78 01/11/2016   CALCIUM 9.1 01/11/2016   PROT 6.9 01/11/2016   ALBUMIN 3.9 01/11/2016   AST 21 01/11/2016   ALT 22 01/11/2016   ALKPHOS 65 01/11/2016   BILITOT 0.6 01/11/2016   GFRNONAA >60 01/11/2016   GFRAA >60 01/11/2016    Lab Results  Component Value Date   WBC 5.5 08/24/2016   NEUTROABS 3.3 08/24/2016  HGB 12.9 08/24/2016   HCT 38.2 08/24/2016   MCV 93.1 08/24/2016   PLT 244 08/24/2016     STUDIES: No results found.  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  PLAN:    1. 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 She has also completed adjuvant XRT. No further intervention is needed. An aromatase inhibitor would not offer benefit given the triple negative status of her disease. Her most recent mammogram on May 03, 2016 was reported as BI-RADS 2.  Return to clinic in 3 months for further evaluation. 2. Anxiety: Improving, monitor.  3. Anemia: Patient's hemoglobin is now within normal limits. 4. Peripheral neuropathy: Continue gabapentin to 600 mg before bedtime.  5. Difficulty sleeping: Improved. Continue Ambien as needed.  Patient expressed understanding and was in agreement with this plan. She also understands that She can call clinic at any time with any questions, concerns, or complaints.   Breast cancer    Staging form: Breast, AJCC 7th Edition     Clinical stage from 06/04/2015: Stage IA (T1c, N0, M0) - Signed by Lloyd Huger, MD on 06/04/2015   Lloyd Huger, MD   08/28/2016 9:22 AM

## 2016-08-24 ENCOUNTER — Inpatient Hospital Stay: Payer: BLUE CROSS/BLUE SHIELD | Attending: Radiation Oncology

## 2016-08-24 ENCOUNTER — Inpatient Hospital Stay (HOSPITAL_BASED_OUTPATIENT_CLINIC_OR_DEPARTMENT_OTHER): Payer: BLUE CROSS/BLUE SHIELD | Admitting: Oncology

## 2016-08-24 VITALS — BP 144/93 | HR 90 | Temp 96.9°F | Resp 20 | Wt 214.5 lb

## 2016-08-24 DIAGNOSIS — Z7982 Long term (current) use of aspirin: Secondary | ICD-10-CM | POA: Insufficient documentation

## 2016-08-24 DIAGNOSIS — Z9221 Personal history of antineoplastic chemotherapy: Secondary | ICD-10-CM

## 2016-08-24 DIAGNOSIS — Z79899 Other long term (current) drug therapy: Secondary | ICD-10-CM | POA: Insufficient documentation

## 2016-08-24 DIAGNOSIS — Z923 Personal history of irradiation: Secondary | ICD-10-CM | POA: Insufficient documentation

## 2016-08-24 DIAGNOSIS — G629 Polyneuropathy, unspecified: Secondary | ICD-10-CM | POA: Diagnosis not present

## 2016-08-24 DIAGNOSIS — Z853 Personal history of malignant neoplasm of breast: Secondary | ICD-10-CM | POA: Diagnosis not present

## 2016-08-24 DIAGNOSIS — Z809 Family history of malignant neoplasm, unspecified: Secondary | ICD-10-CM | POA: Diagnosis not present

## 2016-08-24 DIAGNOSIS — C50912 Malignant neoplasm of unspecified site of left female breast: Secondary | ICD-10-CM | POA: Diagnosis present

## 2016-08-24 DIAGNOSIS — F419 Anxiety disorder, unspecified: Secondary | ICD-10-CM

## 2016-08-24 DIAGNOSIS — I10 Essential (primary) hypertension: Secondary | ICD-10-CM | POA: Diagnosis not present

## 2016-08-24 DIAGNOSIS — C50412 Malignant neoplasm of upper-outer quadrant of left female breast: Secondary | ICD-10-CM

## 2016-08-24 DIAGNOSIS — Z803 Family history of malignant neoplasm of breast: Secondary | ICD-10-CM | POA: Insufficient documentation

## 2016-08-24 LAB — CBC WITH DIFFERENTIAL/PLATELET
BASOS PCT: 1 %
Basophils Absolute: 0.1 10*3/uL (ref 0–0.1)
Eosinophils Absolute: 0.4 10*3/uL (ref 0–0.7)
Eosinophils Relative: 7 %
HEMATOCRIT: 38.2 % (ref 35.0–47.0)
Hemoglobin: 12.9 g/dL (ref 12.0–16.0)
Lymphocytes Relative: 21 %
Lymphs Abs: 1.2 10*3/uL (ref 1.0–3.6)
MCH: 31.4 pg (ref 26.0–34.0)
MCHC: 33.7 g/dL (ref 32.0–36.0)
MCV: 93.1 fL (ref 80.0–100.0)
MONO ABS: 0.6 10*3/uL (ref 0.2–0.9)
MONOS PCT: 11 %
NEUTROS ABS: 3.3 10*3/uL (ref 1.4–6.5)
Neutrophils Relative %: 60 %
Platelets: 244 10*3/uL (ref 150–440)
RBC: 4.11 MIL/uL (ref 3.80–5.20)
RDW: 12.6 % (ref 11.5–14.5)
WBC: 5.5 10*3/uL (ref 3.6–11.0)

## 2016-08-24 MED ORDER — GABAPENTIN 300 MG PO CAPS: 600.0000 mg | ORAL_CAPSULE | Freq: Every day | ORAL | 3 refills | Status: DC

## 2016-08-24 MED ORDER — ZOLPIDEM TARTRATE 5 MG PO TABS: 5.0000 mg | ORAL_TABLET | Freq: Every evening | ORAL | 3 refills | Status: DC | PRN

## 2016-08-24 NOTE — Progress Notes (Signed)
Patient here today for routine follow up regarding breast cancer. Patient reports continuing neuropathy, still taking Gabapentin.

## 2016-08-25 LAB — CANCER ANTIGEN 27.29: CA 27.29: 17.9 U/mL (ref 0.0–38.6)

## 2016-09-07 ENCOUNTER — Inpatient Hospital Stay: Payer: BLUE CROSS/BLUE SHIELD | Attending: Radiation Oncology

## 2016-09-07 ENCOUNTER — Inpatient Hospital Stay: Payer: BLUE CROSS/BLUE SHIELD

## 2016-09-07 DIAGNOSIS — C801 Malignant (primary) neoplasm, unspecified: Secondary | ICD-10-CM

## 2016-09-07 DIAGNOSIS — C50412 Malignant neoplasm of upper-outer quadrant of left female breast: Secondary | ICD-10-CM | POA: Diagnosis not present

## 2016-09-07 DIAGNOSIS — Z171 Estrogen receptor negative status [ER-]: Secondary | ICD-10-CM | POA: Diagnosis not present

## 2016-09-07 DIAGNOSIS — Z452 Encounter for adjustment and management of vascular access device: Secondary | ICD-10-CM | POA: Insufficient documentation

## 2016-09-07 DIAGNOSIS — Z23 Encounter for immunization: Secondary | ICD-10-CM | POA: Insufficient documentation

## 2016-09-07 MED ORDER — INFLUENZA VAC SPLIT QUAD 0.5 ML IM SUSY
0.5000 mL | PREFILLED_SYRINGE | Freq: Once | INTRAMUSCULAR | Status: AC
  Administered 2016-09-07: 0.5 mL via INTRAMUSCULAR

## 2016-09-07 MED ORDER — INFLUENZA VAC SPLIT QUAD 0.5 ML IM SUSY
PREFILLED_SYRINGE | INTRAMUSCULAR | Status: AC
  Filled 2016-09-07: qty 0.5

## 2016-09-07 MED ORDER — SODIUM CHLORIDE 0.9% FLUSH
10.0000 mL | INTRAVENOUS | Status: DC | PRN
  Administered 2016-09-07: 10 mL via INTRAVENOUS
  Filled 2016-09-07: qty 10

## 2016-09-07 MED ORDER — HEPARIN SOD (PORK) LOCK FLUSH 100 UNIT/ML IV SOLN
500.0000 [IU] | Freq: Once | INTRAVENOUS | Status: AC
  Administered 2016-09-07: 500 [IU] via INTRAVENOUS
  Filled 2016-09-07: qty 5

## 2016-10-19 ENCOUNTER — Other Ambulatory Visit: Payer: Self-pay | Admitting: *Deleted

## 2016-10-19 ENCOUNTER — Inpatient Hospital Stay: Payer: BLUE CROSS/BLUE SHIELD | Attending: Radiation Oncology

## 2016-10-19 DIAGNOSIS — Z95828 Presence of other vascular implants and grafts: Secondary | ICD-10-CM

## 2016-10-19 DIAGNOSIS — Z171 Estrogen receptor negative status [ER-]: Secondary | ICD-10-CM | POA: Insufficient documentation

## 2016-10-19 DIAGNOSIS — C50412 Malignant neoplasm of upper-outer quadrant of left female breast: Secondary | ICD-10-CM | POA: Diagnosis not present

## 2016-10-19 DIAGNOSIS — Z452 Encounter for adjustment and management of vascular access device: Secondary | ICD-10-CM | POA: Insufficient documentation

## 2016-10-19 MED ORDER — GABAPENTIN 300 MG PO CAPS: 300.0000 mg | ORAL_CAPSULE | Freq: Four times a day (QID) | ORAL | 3 refills | Status: DC

## 2016-10-19 MED ORDER — SODIUM CHLORIDE 0.9% FLUSH
10.0000 mL | INTRAVENOUS | Status: DC | PRN
  Administered 2016-10-19: 10 mL via INTRAVENOUS
  Filled 2016-10-19: qty 10

## 2016-10-19 MED ORDER — HEPARIN SOD (PORK) LOCK FLUSH 100 UNIT/ML IV SOLN
INTRAVENOUS | Status: AC
  Filled 2016-10-19: qty 5

## 2016-10-19 MED ORDER — HEPARIN SOD (PORK) LOCK FLUSH 100 UNIT/ML IV SOLN
500.0000 [IU] | Freq: Once | INTRAVENOUS | Status: AC
  Administered 2016-10-19: 500 [IU] via INTRAVENOUS

## 2016-11-16 ENCOUNTER — Ambulatory Visit: Payer: BLUE CROSS/BLUE SHIELD | Admitting: Oncology

## 2016-11-23 ENCOUNTER — Telehealth: Payer: Self-pay | Admitting: *Deleted

## 2016-11-23 MED ORDER — AMOXICILLIN-POT CLAVULANATE 875-125 MG PO TABS: 1.0000 | ORAL_TABLET | Freq: Two times a day (BID) | ORAL | 0 refills | Status: AC

## 2016-11-23 NOTE — Telephone Encounter (Signed)
Patient called to report a two week "sinus infection" she would like an ABT for this condition. She has tried two OTC medications without results.

## 2016-11-23 NOTE — Telephone Encounter (Signed)
Called patient and advised her script was ready.

## 2016-11-23 NOTE — Telephone Encounter (Signed)
augmentin 875/125 bid x7 days.

## 2016-11-28 NOTE — Progress Notes (Signed)
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  INTERVAL HISTORY: Patient returns to clinic today for routine 3 month evaluation. She recently saw her PCP for congestion and sinus pressure and was given Augmentin. She states she is not feeling any better, but is following up with PCP next week. She continues to feel well and is asymptomatic. Her peripheral neuropathy remains unchanged in her feet but better in her hands with gabapentin. She is very tearful today and worried about recurrence of her cancer. She continues to do SBE. She has no other neurologic complaints. She denies any recent fevers She has a good appetite and denies weight loss. She denies any pain. She has no chest pain or shortness of breath. She denies any nausea, vomiting, constipation, or diarrhea. She has no urinary complaints. Patient offers no further specific complaints today.  REVIEW OF SYSTEMS:   Review of Systems  Constitutional: Negative.  Negative for fever and malaise/fatigue.  Respiratory: Negative.  Negative for shortness of breath.   Cardiovascular: Negative.  Negative for chest pain and leg swelling.  Gastrointestinal: Negative.   Musculoskeletal: Negative.   Neurological: Positive for sensory change. Negative for weakness.  Endo/Heme/Allergies: Does not bruise/bleed easily.  Psychiatric/Behavioral: The patient is nervous/anxious and has insomnia.     As per HPI. Otherwise, a complete review of systems is negative.  PAST MEDICAL HISTORY: Past Medical History:  Diagnosis Date  . Breast cancer ([place]) 2016   LT LUMPECTOMY  .  Frequent sinus infections   . History of chemotherapy 2016   BREAST CA  . Hypertension   . Radiation 2016-17   BREAST CA  . Seasonal allergies     PAST SURGICAL HISTORY: Past Surgical History:  Procedure Laterality Date  . BREAST BIOPSY Left 04/27/2015   POS  . BREAST LUMPECTOMY WITH NEEDLE LOCALIZATION Left 05/19/2015   Procedure: BREAST LUMPECTOMY WITH NEEDLE LOCALIZATION;  Surgeon: Florene Glen, MD;  Location: [facility];  Service: General;  Laterality: Left;  . BREAST LUMPECTOMY WITH SENTINEL LYMPH NODE BIOPSY Left 05/19/2015   Procedure: BREAST LUMPECTOMY WITH SENTINEL LYMPH NODE BX;  Surgeon: Florene Glen, MD;  Location: [facility];  Service: General;  Laterality: Left;  Marland Kitchen MASTECTOMY, PARTIAL Left 05/19/2015   Procedure: MASTECTOMY PARTIAL;  Surgeon: Florene Glen, MD;  Location: [facility];  Service: General;  Laterality: Left;  . PORTACATH PLACEMENT Left 05/19/2015   Procedure: INSERTION PORT-A-CATH;  Surgeon: Florene Glen, MD;  Location: [facility];  Service: General;  Laterality: Left;    FAMILY HISTORY Family History  Problem Relation Age of Onset  . Cancer Father   . Hypertension Father   . CAD Father   . Heart attack Father   . Breast cancer Cousin        ADVANCED DIRECTIVES:    HEALTH MAINTENANCE: Social History  Substance Use Topics  . Smoking status: Never Smoker  . Smokeless tobacco: Never Used  . Alcohol use Yes     Comment: rare beer     Colonoscopy:  PAP:  Bone density:  Lipid panel:  No Known Allergies  Current Outpatient Prescriptions  Medication Sig Dispense Refill  .  acetaminophen (TYLENOL) 500 MG tablet Take 1,000 mg by mouth every 6 (six) hours as needed for mild pain or headache.    Marland Kitchen amoxicillin-clavulanate (AUGMENTIN) 875-125 MG tablet Take 1 tablet by mouth 2 (two) times daily. 14 tablet 0  . aspirin 325 MG tablet Take 325 mg by mouth daily.    . B Complex Vitamins (VITAMIN B COMPLEX PO) Take 1 tablet by mouth daily as needed.     . Biotin 10 MG CAPS Take by mouth.    . Calcium Carbonate-Vitamin D (CALCIUM-VITAMIN D) 500-200 MG-UNIT per tablet Take 1 tablet by mouth daily.    . fluticasone (FLONASE) 50 MCG/ACT nasal spray Place into the nose.    . gabapentin (NEURONTIN) 300 MG capsule Take 1 capsule (300 mg total) by mouth 4 (four) times daily. 60 capsule 3  . lidocaine-prilocaine (EMLA) cream Apply 1 application topically as needed. Apply to port and cover with saran wrap 1-2 hours before chemotherapy 30 g 2  . loratadine (CLARITIN) 10 MG tablet Take by mouth.    . Multiple Vitamin (MULTIVITAMIN) tablet Take 1 tablet by mouth daily.    . polyethylene glycol powder (GLYCOLAX/MIRALAX) powder Take 1 Container by mouth once.    Marland Kitchen zolpidem (AMBIEN) 5 MG tablet Take 1 tablet (5 mg total) by mouth at bedtime as needed. for sleep 30 tablet 3   No current facility-administered medications for this visit.    Facility-Administered Medications Ordered in Other Visits  Medication Dose Route Frequency Provider Last Rate Last Dose  . heparin lock flush 100 unit/mL  500 Units Intravenous Once Lloyd Huger, MD      . heparin lock flush 100 unit/mL  500 Units Intravenous Once Lloyd Huger, MD      . heparin lock flush 100 unit/mL  500 Units Intracatheter Once PRN Lloyd Huger, MD      . sodium chloride 0.9 % injection 10 mL  10 mL Intracatheter PRN Lloyd Huger, MD   10 mL at 07/22/15 0956  . sodium chloride 0.9 % injection 10 mL  10 mL Intravenous PRN Lloyd Huger, MD   10 mL at 09/02/15 0941  . sodium chloride 0.9 % injection 10 mL  10 mL Intravenous PRN Lloyd Huger, MD      . sodium chloride 0.9 % injection 10 mL  10 mL Intracatheter PRN Lloyd Huger, MD   10 mL at 09/23/15 1205  . sodium chloride 0.9 % injection 10 mL  10 mL Intracatheter PRN Lloyd Huger, MD   10 mL at 09/30/15 0900  . sodium chloride flush (NS) 0.9 % injection 10 mL  10 mL Intravenous PRN Lloyd Huger, MD    10 mL at 11/30/16 1005    OBJECTIVE: Vitals:   11/30/16 0909  BP: (!) 154/95  Pulse: 85  Resp: 18  Temp: 97.3 F (36.3 C)     Body mass index is 31.55 kg/m.    ECOG FS:0 - Asymptomatic  General: Well-developed, well-nourished, no acute distress. Eyes: anicteric sclera. Breasts: Bilateral breast and axilla without lumps or masses. Lungs: Clear to auscultation bilaterally. Heart: Regular rate and rhythm. No rubs, murmurs, or gallops. Abdomen: Soft, nontender, nondistended. No organomegaly noted, normoactive bowel sounds. Musculoskeletal: No edema, cyanosis, or clubbing. Neuro: Alert, answering all questions appropriately. Cranial nerves grossly intact. Skin: No rashes or petechiae noted. Psych: Normal affect.   LAB RESULTS:  Lab Results  Component Value Date   NA 136 01/11/2016  K 3.8 01/11/2016   CL 101 01/11/2016   CO2 27 01/11/2016   GLUCOSE 96 01/11/2016   BUN 17 01/11/2016   CREATININE 0.78 01/11/2016   CALCIUM 9.1 01/11/2016   PROT 6.9 01/11/2016   ALBUMIN 3.9 01/11/2016   AST 21 01/11/2016   ALT 22 01/11/2016   ALKPHOS 65 01/11/2016   BILITOT 0.6 01/11/2016   GFRNONAA >60 01/11/2016   GFRAA >60 01/11/2016    Lab Results  Component Value Date   WBC 5.5 08/24/2016   NEUTROABS 3.3 08/24/2016   HGB 12.9 08/24/2016   HCT 38.2 08/24/2016   MCV 93.1 08/24/2016   PLT 244 08/24/2016   Lab Results  Component Value Date   LABCA2 17.9 08/24/2016     STUDIES: No results found.  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  PLAN:    1. 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 She has also completed adjuvant XRT. No further intervention is needed. An aromatase inhibitor would not offer benefit given the triple negative status of her  disease. Her most recent mammogram on May 03, 2016 was reported as BI-RADS 2.  Return to clinic in 3 months for further evaluation. 2. Anxiety: Improving. She is just concerned about recurrence. Monitor.  3. Anemia: Resolved. 4. Peripheral neuropathy: Continue gabapentin 600 mg before bedtime.  5. Difficulty sleeping: Improved. Continue Ambien as needed.  Patient expressed understanding and was in agreement with this plan. She also understands that She can call clinic at any time with any questions, concerns, or complaints.   Breast cancer   Staging form: Breast, AJCC 7th Edition     Clinical stage from 06/04/2015: Stage IA (T1c, N0, M0) - Signed by Lloyd Huger, MD on 06/04/2015  Faythe Casa, NP 11/30/2016 10:20 AM  Patient was seen and evaluated independently and I agree with the assessment and plan as dictated above. Patient was instructed to complete her prescription for Augmentin and continue OTC treatments as needed for her cough and congestion.  Lloyd Huger, MD 11/30/16 11:56 AM

## 2016-11-30 ENCOUNTER — Inpatient Hospital Stay (HOSPITAL_BASED_OUTPATIENT_CLINIC_OR_DEPARTMENT_OTHER): Payer: BLUE CROSS/BLUE SHIELD | Admitting: Oncology

## 2016-11-30 ENCOUNTER — Inpatient Hospital Stay: Payer: BLUE CROSS/BLUE SHIELD | Attending: Radiation Oncology

## 2016-11-30 VITALS — BP 154/95 | HR 85 | Temp 97.3°F | Resp 18 | Wt 213.6 lb

## 2016-11-30 DIAGNOSIS — Z79899 Other long term (current) drug therapy: Secondary | ICD-10-CM | POA: Insufficient documentation

## 2016-11-30 DIAGNOSIS — Z171 Estrogen receptor negative status [ER-]: Secondary | ICD-10-CM | POA: Diagnosis not present

## 2016-11-30 DIAGNOSIS — G4700 Insomnia, unspecified: Secondary | ICD-10-CM | POA: Diagnosis not present

## 2016-11-30 DIAGNOSIS — C50412 Malignant neoplasm of upper-outer quadrant of left female breast: Secondary | ICD-10-CM | POA: Diagnosis not present

## 2016-11-30 DIAGNOSIS — G629 Polyneuropathy, unspecified: Secondary | ICD-10-CM | POA: Diagnosis not present

## 2016-11-30 DIAGNOSIS — Z9221 Personal history of antineoplastic chemotherapy: Secondary | ICD-10-CM | POA: Insufficient documentation

## 2016-11-30 DIAGNOSIS — Z7982 Long term (current) use of aspirin: Secondary | ICD-10-CM | POA: Insufficient documentation

## 2016-11-30 DIAGNOSIS — Z923 Personal history of irradiation: Secondary | ICD-10-CM

## 2016-11-30 DIAGNOSIS — F419 Anxiety disorder, unspecified: Secondary | ICD-10-CM

## 2016-11-30 DIAGNOSIS — Z9012 Acquired absence of left breast and nipple: Secondary | ICD-10-CM | POA: Insufficient documentation

## 2016-11-30 DIAGNOSIS — Z803 Family history of malignant neoplasm of breast: Secondary | ICD-10-CM

## 2016-11-30 DIAGNOSIS — C50912 Malignant neoplasm of unspecified site of left female breast: Secondary | ICD-10-CM

## 2016-11-30 DIAGNOSIS — I10 Essential (primary) hypertension: Secondary | ICD-10-CM | POA: Diagnosis not present

## 2016-11-30 MED ORDER — HEPARIN SOD (PORK) LOCK FLUSH 100 UNIT/ML IV SOLN
500.0000 [IU] | Freq: Once | INTRAVENOUS | Status: AC
  Administered 2016-11-30: 500 [IU] via INTRAVENOUS

## 2016-11-30 MED ORDER — SODIUM CHLORIDE 0.9% FLUSH
10.0000 mL | INTRAVENOUS | Status: DC | PRN
  Administered 2016-11-30: 10 mL via INTRAVENOUS
  Filled 2016-11-30: qty 10

## 2016-11-30 NOTE — Progress Notes (Signed)
Complains of sinus congestion that has been unrelieved by augmentin. Pt has been using OTC decongestants without relief as well.

## 2016-12-01 LAB — CANCER ANTIGEN 27.29: CA 27.29: 15.5 U/mL (ref 0.0–38.6)

## 2017-01-11 ENCOUNTER — Inpatient Hospital Stay: Payer: BLUE CROSS/BLUE SHIELD | Attending: Radiation Oncology

## 2017-01-11 DIAGNOSIS — Z452 Encounter for adjustment and management of vascular access device: Secondary | ICD-10-CM | POA: Diagnosis not present

## 2017-01-11 DIAGNOSIS — Z853 Personal history of malignant neoplasm of breast: Secondary | ICD-10-CM | POA: Diagnosis not present

## 2017-01-11 DIAGNOSIS — Z95828 Presence of other vascular implants and grafts: Secondary | ICD-10-CM

## 2017-01-11 DIAGNOSIS — C50412 Malignant neoplasm of upper-outer quadrant of left female breast: Secondary | ICD-10-CM | POA: Diagnosis present

## 2017-01-11 MED ORDER — HEPARIN SOD (PORK) LOCK FLUSH 100 UNIT/ML IV SOLN
INTRAVENOUS | Status: AC
  Filled 2017-01-11: qty 5

## 2017-01-11 MED ORDER — HEPARIN SOD (PORK) LOCK FLUSH 100 UNIT/ML IV SOLN
500.0000 [IU] | Freq: Once | INTRAVENOUS | Status: AC
  Administered 2017-01-11: 500 [IU] via INTRAVENOUS

## 2017-01-11 MED ORDER — SODIUM CHLORIDE 0.9% FLUSH
10.0000 mL | INTRAVENOUS | Status: DC | PRN
  Administered 2017-01-11: 10 mL via INTRAVENOUS
  Filled 2017-01-11: qty 10

## 2017-02-15 ENCOUNTER — Other Ambulatory Visit: Payer: Self-pay

## 2017-02-15 DIAGNOSIS — C50412 Malignant neoplasm of upper-outer quadrant of left female breast: Secondary | ICD-10-CM

## 2017-02-21 NOTE — Progress Notes (Signed)
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  INTERVAL HISTORY: Patient returns to clinic today for routine 3 month evaluation. She continues to feel well and is asymptomatic. Her peripheral neuropathy remains unchanged in her legs. She has no other neurologic complaints. She denies any recent fevers.  She has a good appetite and denies weight loss. She denies any pain. She has no chest pain or shortness of breath. She denies any nausea, vomiting, constipation, or diarrhea. She has no urinary complaints. Patient offers no further specific complaints today.  REVIEW OF SYSTEMS:   Review of Systems  Constitutional: Negative.  Negative for fever, malaise/fatigue and weight loss.  Respiratory: Negative.  Negative for shortness of breath.   Cardiovascular: Negative.  Negative for chest pain and leg swelling.  Gastrointestinal: Negative.  Negative for abdominal pain.  Genitourinary: Negative.   Musculoskeletal: Negative.   Neurological: Positive for sensory change. Negative for weakness.  Endo/Heme/Allergies: Does not bruise/bleed easily.  Psychiatric/Behavioral: The patient is nervous/anxious. The patient does not have insomnia.     As per HPI. Otherwise, a complete review of systems is negative.  PAST MEDICAL HISTORY: Past Medical History:  Diagnosis Date  . Breast cancer ([place]) 2016   LT LUMPECTOMY  . Frequent sinus infections   . History of chemotherapy 2016   BREAST CA  . Hypertension   . Radiation 2016-17   BREAST CA  . Seasonal allergies     PAST SURGICAL HISTORY: Past Surgical History:    Procedure Laterality Date  . BREAST BIOPSY Left 04/27/2015   POS  . BREAST LUMPECTOMY WITH NEEDLE LOCALIZATION Left 05/19/2015   Procedure: BREAST LUMPECTOMY WITH NEEDLE LOCALIZATION;  Surgeon: Florene Glen, MD;  Location: [facility];  Service: General;  Laterality: Left;  . BREAST LUMPECTOMY WITH SENTINEL LYMPH NODE BIOPSY Left 05/19/2015   Procedure: BREAST LUMPECTOMY WITH SENTINEL LYMPH NODE BX;  Surgeon: Florene Glen, MD;  Location: [facility];  Service: General;  Laterality: Left;  Marland Kitchen MASTECTOMY, PARTIAL Left 05/19/2015   Procedure: MASTECTOMY PARTIAL;  Surgeon: Florene Glen, MD;  Location: [facility];  Service: General;  Laterality: Left;  . PORTACATH PLACEMENT Left 05/19/2015   Procedure: INSERTION PORT-A-CATH;  Surgeon: Florene Glen, MD;  Location: [facility];  Service: General;  Laterality: Left;    FAMILY HISTORY Family History  Problem Relation Age of Onset  . Cancer Father   . Hypertension Father   . CAD Father   . Heart attack Father   . Breast cancer Cousin        ADVANCED DIRECTIVES:    HEALTH MAINTENANCE: Social History  Substance Use Topics  . Smoking status: Never Smoker  . Smokeless tobacco: Never Used  . Alcohol use Yes     Comment: rare beer     Colonoscopy:  PAP:  Bone density:  Lipid panel:  No Known Allergies  Current Outpatient Prescriptions  Medication Sig Dispense Refill  . acetaminophen (TYLENOL) 500 MG tablet Take 1,000 mg by mouth every 6 (six) hours as needed for mild pain or headache.    Marland Kitchen aspirin 325 MG tablet Take 325 mg by mouth daily.    Marland Kitchen  B Complex Vitamins (VITAMIN B COMPLEX PO) Take 1 tablet by mouth daily as needed.    . Biotin 10 MG CAPS Take 1 capsule by mouth daily.     . Calcium Carbonate-Vitamin D (CALCIUM-VITAMIN D) 500-200 MG-UNIT per tablet Take 1 tablet by mouth daily.    . fluticasone (FLONASE) 50 MCG/ACT nasal spray Place 1 spray into both nostrils as needed.     . gabapentin (NEURONTIN) 300 MG capsule Take 1  capsule (300 mg total) by mouth 4 (four) times daily. 60 capsule 3  . ibuprofen (ADVIL,MOTRIN) 200 MG tablet Take 200 mg by mouth every 6 (six) hours as needed.    . lidocaine-prilocaine (EMLA) cream Apply 1 application topically as needed. Apply to port and cover with saran wrap 1-2 hours before chemotherapy 30 g 2  . loratadine (CLARITIN) 10 MG tablet Take 10 mg by mouth daily as needed.     . Multiple Vitamin (MULTIVITAMIN) tablet Take 1 tablet by mouth daily.    . polyethylene glycol powder (GLYCOLAX/MIRALAX) powder Take 17 g by mouth daily as needed.     . TURMERIC PO Take 1 capsule by mouth daily.    Marland Kitchen zolpidem (AMBIEN) 5 MG tablet Take 1 tablet (5 mg total) by mouth at bedtime as needed. for sleep 30 tablet 3  . pregabalin (LYRICA) 75 MG capsule Take one capsule (85m) daily 30 capsule 0   No current facility-administered medications for this visit.    Facility-Administered Medications Ordered in Other Visits  Medication Dose Route Frequency Provider Last Rate Last Dose  . heparin lock flush 100 unit/mL  500 Units Intravenous Once TLloyd Huger MD      . heparin lock flush 100 unit/mL  500 Units Intravenous Once TLloyd Huger MD      . heparin lock flush 100 unit/mL  500 Units Intracatheter Once PRN TLloyd Huger MD      . sodium chloride 0.9 % injection 10 mL  10 mL Intracatheter PRN TLloyd Huger MD   10 mL at 07/22/15 0956  . sodium chloride 0.9 % injection 10 mL  10 mL Intravenous PRN TLloyd Huger MD   10 mL at 09/02/15 0941  . sodium chloride 0.9 % injection 10 mL  10 mL Intravenous PRN TLloyd Huger MD      . sodium chloride 0.9 % injection 10 mL  10 mL Intracatheter PRN TLloyd Huger MD   10 mL at 09/23/15 1205  . sodium chloride 0.9 % injection 10 mL  10 mL Intracatheter PRN TLloyd Huger MD   10 mL at 09/30/15 0900    OBJECTIVE: Vitals:   02/22/17 0926  BP: (!) 161/102  Pulse: 76  Resp: 18  Temp: 97 F (36.1 C)     Body  mass index is 32.31 kg/m.    ECOG FS:0 - Asymptomatic  General: Well-developed, well-nourished, no acute distress. Eyes: anicteric sclera. Breasts: Bilateral breast and axilla without lumps or masses. Lungs: Clear to auscultation bilaterally. Heart: Regular rate and rhythm. No rubs, murmurs, or gallops. Abdomen: Soft, nontender, nondistended. No organomegaly noted, normoactive bowel sounds. Musculoskeletal: No edema, cyanosis, or clubbing. Neuro: Alert, answering all questions appropriately. Cranial nerves grossly intact. Skin: No rashes or petechiae noted. Psych: Normal affect.   LAB RESULTS:  Lab Results  Component Value Date   NA 136 01/11/2016   K 3.8 01/11/2016   CL 101 01/11/2016   CO2 27 01/11/2016   GLUCOSE 96 01/11/2016  BUN 17 01/11/2016   CREATININE 0.78 01/11/2016   CALCIUM 9.1 01/11/2016   PROT 6.9 01/11/2016   ALBUMIN 3.9 01/11/2016   AST 21 01/11/2016   ALT 22 01/11/2016   ALKPHOS 65 01/11/2016   BILITOT 0.6 01/11/2016   GFRNONAA >60 01/11/2016   GFRAA >60 01/11/2016    Lab Results  Component Value Date   WBC 5.5 08/24/2016   NEUTROABS 3.3 08/24/2016   HGB 12.9 08/24/2016   HCT 38.2 08/24/2016   MCV 93.1 08/24/2016   PLT 244 08/24/2016   Lab Results  Component Value Date   LABCA2 15.5 11/30/2016     STUDIES: No results found.  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  PLAN:    1. 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 She has also completed adjuvant XRT. No further intervention is needed. An aromatase inhibitor would not offer benefit given the triple negative status of her disease. Her most recent mammogram on May 03, 2016 was reported as BI-RADS 2.  Return to clinic in 6 months for further evaluation. 2.  Anxiety: Improving. Monitor.  3. Anemia: Resolved. 4. Peripheral neuropathy: Patient will discontinue gabapentin and was given a prescription for Lyrica 75 mg today.  5. Difficulty sleeping: Improved. Continue Ambien as needed.  Patient expressed understanding and was in agreement with this plan. She also understands that She can call clinic at any time with any questions, concerns, or complaints.   Breast cancer   Staging form: Breast, AJCC 7th Edition     Clinical stage from 06/04/2015: Stage IA (T1c, N0, M0) - Signed by Lloyd Huger, MD on 06/04/2015   Lloyd Huger, MD 02/22/17 10:52 AM

## 2017-02-22 ENCOUNTER — Inpatient Hospital Stay (HOSPITAL_BASED_OUTPATIENT_CLINIC_OR_DEPARTMENT_OTHER): Payer: BLUE CROSS/BLUE SHIELD | Admitting: Oncology

## 2017-02-22 ENCOUNTER — Inpatient Hospital Stay: Payer: BLUE CROSS/BLUE SHIELD

## 2017-02-22 ENCOUNTER — Inpatient Hospital Stay: Payer: BLUE CROSS/BLUE SHIELD | Attending: Radiation Oncology

## 2017-02-22 VITALS — BP 161/102 | HR 76 | Temp 97.0°F | Resp 18 | Wt 218.8 lb

## 2017-02-22 DIAGNOSIS — Z923 Personal history of irradiation: Secondary | ICD-10-CM | POA: Insufficient documentation

## 2017-02-22 DIAGNOSIS — Z803 Family history of malignant neoplasm of breast: Secondary | ICD-10-CM | POA: Insufficient documentation

## 2017-02-22 DIAGNOSIS — Z7982 Long term (current) use of aspirin: Secondary | ICD-10-CM

## 2017-02-22 DIAGNOSIS — I10 Essential (primary) hypertension: Secondary | ICD-10-CM | POA: Insufficient documentation

## 2017-02-22 DIAGNOSIS — F419 Anxiety disorder, unspecified: Secondary | ICD-10-CM | POA: Insufficient documentation

## 2017-02-22 DIAGNOSIS — Z79899 Other long term (current) drug therapy: Secondary | ICD-10-CM

## 2017-02-22 DIAGNOSIS — C50412 Malignant neoplasm of upper-outer quadrant of left female breast: Secondary | ICD-10-CM | POA: Diagnosis present

## 2017-02-22 DIAGNOSIS — Z171 Estrogen receptor negative status [ER-]: Secondary | ICD-10-CM | POA: Diagnosis not present

## 2017-02-22 DIAGNOSIS — G629 Polyneuropathy, unspecified: Secondary | ICD-10-CM

## 2017-02-22 MED ORDER — ZOLPIDEM TARTRATE 5 MG PO TABS: 5.0000 mg | ORAL_TABLET | Freq: Every evening | ORAL | 3 refills | Status: DC | PRN

## 2017-02-22 MED ORDER — SODIUM CHLORIDE 0.9% FLUSH
10.0000 mL | INTRAVENOUS | Status: DC | PRN
  Administered 2017-02-22: 10 mL via INTRAVENOUS
  Filled 2017-02-22: qty 10

## 2017-02-22 MED ORDER — PREGABALIN 75 MG PO CAPS: ORAL_CAPSULE | ORAL | 0 refills | Status: DC

## 2017-02-22 MED ORDER — HEPARIN SOD (PORK) LOCK FLUSH 100 UNIT/ML IV SOLN
500.0000 [IU] | Freq: Once | INTRAVENOUS | Status: AC
  Administered 2017-02-22: 500 [IU] via INTRAVENOUS
  Filled 2017-02-22: qty 5

## 2017-02-22 NOTE — Progress Notes (Signed)
Complains of bilateral leg pain due to neuropathy. Eyes are watering today due to allergies. Pt requests refill of ambien as well.

## 2017-02-23 LAB — CANCER ANTIGEN 27.29: CA 27.29: 20.6 U/mL (ref 0.0–38.6)

## 2017-03-07 ENCOUNTER — Ambulatory Visit: Payer: BLUE CROSS/BLUE SHIELD | Admitting: Surgery

## 2017-03-08 ENCOUNTER — Encounter: Payer: Self-pay | Admitting: Oncology

## 2017-03-12 ENCOUNTER — Ambulatory Visit: Payer: BLUE CROSS/BLUE SHIELD | Admitting: Surgery

## 2017-03-27 ENCOUNTER — Telehealth: Payer: Self-pay

## 2017-03-27 NOTE — Telephone Encounter (Signed)
Patient has been notified that she had refills on file and will go by and pick up this afternoon

## 2017-03-27 NOTE — Telephone Encounter (Signed)
-----   Message from Secundino Ginger sent at 03/27/2017  7:56 AM EDT ----- Regarding: med refill This pt needs refill on her Ambien please  [facility]

## 2017-04-01 ENCOUNTER — Encounter: Payer: Self-pay | Admitting: Surgery

## 2017-04-01 ENCOUNTER — Ambulatory Visit (INDEPENDENT_AMBULATORY_CARE_PROVIDER_SITE_OTHER): Payer: BLUE CROSS/BLUE SHIELD | Admitting: Surgery

## 2017-04-01 VITALS — BP 174/104 | HR 78 | Temp 98.1°F | Ht 69.0 in | Wt 219.2 lb

## 2017-04-01 DIAGNOSIS — C50212 Malignant neoplasm of upper-inner quadrant of left female breast: Secondary | ICD-10-CM | POA: Diagnosis not present

## 2017-04-01 NOTE — Progress Notes (Signed)
Outpatient Surgical Follow Up  04/01/2017  Brittany Ryan is an 56 y.o. female.   CC: Breast cancer  HPI: This patient with breast cancer who desires port removal. Patient has no problems no new medical problems. She has finished her chemotherapy and radiotherapy last year.  Past Medical History:  Diagnosis Date  . Breast cancer ([place]) 2016   LT LUMPECTOMY  . Frequent sinus infections   . History of chemotherapy 2016   BREAST CA  . Hypertension   . Radiation 2016-17   BREAST CA  . Seasonal allergies     Past Surgical History:  Procedure Laterality Date  . BREAST BIOPSY Left 04/27/2015   POS  . BREAST LUMPECTOMY WITH NEEDLE LOCALIZATION Left 05/19/2015   Procedure: BREAST LUMPECTOMY WITH NEEDLE LOCALIZATION;  Surgeon: Florene Glen, MD;  Location: [facility];  Service: General;  Laterality: Left;  . BREAST LUMPECTOMY WITH SENTINEL LYMPH NODE BIOPSY Left 05/19/2015   Procedure: BREAST LUMPECTOMY WITH SENTINEL LYMPH NODE BX;  Surgeon: Florene Glen, MD;  Location: [facility];  Service: General;  Laterality: Left;  Marland Kitchen MASTECTOMY, PARTIAL Left 05/19/2015   Procedure: MASTECTOMY PARTIAL;  Surgeon: Florene Glen, MD;  Location: [facility];  Service: General;  Laterality: Left;  . PORTACATH PLACEMENT Left 05/19/2015   Procedure: INSERTION PORT-A-CATH;  Surgeon: Florene Glen, MD;  Location: [facility];  Service: General;  Laterality: Left;    Family History  Problem Relation Age of Onset  . Cancer Father   . Hypertension Father   . CAD Father   . Heart attack Father   . Breast cancer Cousin     Social History:  reports that she has never smoked. She has never used smokeless tobacco. She reports that she drinks alcohol. She reports that she does not use drugs.  Allergies: No Known Allergies  Medications reviewed.   Review of Systems:   Review of Systems  Constitutional: Negative.   HENT: Negative.   Eyes: Negative.   Respiratory: Negative.   Cardiovascular: Negative.    Gastrointestinal: Negative.   Genitourinary: Negative.   Musculoskeletal: Negative.   Skin: Negative.   Neurological: Negative.   Endo/Heme/Allergies: Negative.   Psychiatric/Behavioral: Negative.      Physical Exam:  There were no vitals taken for this visit.  Physical Exam  Constitutional: She is oriented to person, place, and time and well-developed, well-nourished, and in no distress. No distress.  HENT:  Head: Normocephalic and atraumatic.  Eyes: Pupils are equal, round, and reactive to light. Right eye exhibits no discharge. Left eye exhibits no discharge. No scleral icterus.  Neck: Normal range of motion.  Cardiovascular: Normal rate, regular rhythm and normal heart sounds.   Pulmonary/Chest: Effort normal and breath sounds normal. No respiratory distress. She has no wheezes.  Left chest port site with port. No erythema  Musculoskeletal: Normal range of motion. She exhibits no edema or tenderness.  Lymphadenopathy:    She has no cervical adenopathy.  Neurological: She is alert and oriented to person, place, and time.  Skin: Skin is warm and dry. No rash noted. She is not diaphoretic. No erythema.  Psychiatric: Mood and affect normal.  Vitals reviewed.     No results found for this or any previous visit (from the past 48 hour(s)). No results found.  Assessment/Plan:  This a patient with breast cancer who desires port removal she has completed all of her therapy. We discussed rationale for removing the port the options of observation the leaving the  port in but she wishes to have it removed we discussed risk bleeding infection cosmetic problems she understood and agreed to proceed  Florene Glen, MD, FACS

## 2017-04-01 NOTE — Patient Instructions (Signed)
We have your surgery scheduled for 04/16/17 at [facility] with [place]. Please see your [facility] pre-care sheet for surgery information. Please call our office if you have any questions or concerns.

## 2017-04-02 ENCOUNTER — Telehealth: Payer: Self-pay

## 2017-04-02 DIAGNOSIS — I10 Essential (primary) hypertension: Secondary | ICD-10-CM | POA: Insufficient documentation

## 2017-04-02 DIAGNOSIS — J302 Other seasonal allergic rhinitis: Secondary | ICD-10-CM | POA: Insufficient documentation

## 2017-04-02 DIAGNOSIS — J329 Chronic sinusitis, unspecified: Secondary | ICD-10-CM | POA: Insufficient documentation

## 2017-04-02 DIAGNOSIS — Z923 Personal history of irradiation: Secondary | ICD-10-CM | POA: Insufficient documentation

## 2017-04-02 NOTE — Telephone Encounter (Signed)
Patient has been advised of Surgery Date as well as Pre-Admission appointment date, time, and location.  Surgery Date: 04/16/17  Pre-admit Appointment: 04/09/17 at 0900am (Office)  Patient has been advised to call [phone] the day before surgery between 1-3pm to obtain arrival time.  Patient has been advised to hold her Aspirin from 5/10-5/14. Her last dose that she will take is on 04/10/17. Patient understands this information.

## 2017-04-03 NOTE — Telephone Encounter (Signed)
No authorization required for CPT code [id number] per Jeanette Caprice S.@ [place].  [facility] is inactive as of 12/03/15. Reference number#[id number]

## 2017-04-05 ENCOUNTER — Inpatient Hospital Stay: Payer: BLUE CROSS/BLUE SHIELD | Attending: Radiation Oncology

## 2017-04-05 ENCOUNTER — Other Ambulatory Visit: Payer: BLUE CROSS/BLUE SHIELD

## 2017-04-05 DIAGNOSIS — Z452 Encounter for adjustment and management of vascular access device: Secondary | ICD-10-CM | POA: Insufficient documentation

## 2017-04-05 DIAGNOSIS — C50412 Malignant neoplasm of upper-outer quadrant of left female breast: Secondary | ICD-10-CM | POA: Insufficient documentation

## 2017-04-05 DIAGNOSIS — Z95828 Presence of other vascular implants and grafts: Secondary | ICD-10-CM

## 2017-04-05 MED ORDER — HEPARIN SOD (PORK) LOCK FLUSH 100 UNIT/ML IV SOLN
500.0000 [IU] | Freq: Once | INTRAVENOUS | Status: AC
  Administered 2017-04-05: 500 [IU] via INTRAVENOUS
  Filled 2017-04-05: qty 5

## 2017-04-05 MED ORDER — SODIUM CHLORIDE 0.9% FLUSH
10.0000 mL | INTRAVENOUS | Status: DC | PRN
  Administered 2017-04-05: 10 mL via INTRAVENOUS
  Filled 2017-04-05: qty 10

## 2017-04-08 ENCOUNTER — Other Ambulatory Visit: Payer: Self-pay

## 2017-04-08 MED ORDER — GABAPENTIN 300 MG PO CAPS
300.0000 mg | ORAL_CAPSULE | Freq: Three times a day (TID) | ORAL | 5 refills | Status: DC
Start: 2017-04-08 — End: 2017-08-23

## 2017-04-09 ENCOUNTER — Encounter
Admission: RE | Admit: 2017-04-09 | Discharge: 2017-04-09 | Disposition: A | Discharge status: Home / Self Care | Payer: BLUE CROSS/BLUE SHIELD | Source: Ambulatory Visit | Attending: Surgery | Admitting: Surgery

## 2017-04-09 DIAGNOSIS — Z01810 Encounter for preprocedural cardiovascular examination: Secondary | ICD-10-CM | POA: Diagnosis not present

## 2017-04-09 DIAGNOSIS — Z01812 Encounter for preprocedural laboratory examination: Secondary | ICD-10-CM | POA: Insufficient documentation

## 2017-04-09 DIAGNOSIS — I10 Essential (primary) hypertension: Secondary | ICD-10-CM | POA: Diagnosis not present

## 2017-04-09 HISTORY — DX: Unspecified osteoarthritis, unspecified site: M19.90

## 2017-04-09 HISTORY — DX: Polyneuropathy, unspecified: G62.9

## 2017-04-09 LAB — BASIC METABOLIC PANEL
Anion gap: 7 (ref 5–15)
BUN: 16 mg/dL (ref 6–20)
CO2: 30 mmol/L (ref 22–32)
CREATININE: 0.63 mg/dL (ref 0.44–1.00)
Calcium: 9.4 mg/dL (ref 8.9–10.3)
Chloride: 103 mmol/L (ref 101–111)
GFR calc Af Amer: >60 mL/min (ref >60)
GFR calc non Af Amer: >60 mL/min (ref >60)
GLUCOSE: 98 mg/dL (ref 65–99)
Potassium: 4.1 mmol/L (ref 3.5–5.1)
SODIUM: 140 mmol/L (ref 135–145)

## 2017-04-09 LAB — CBC WITH DIFFERENTIAL/PLATELET
BASOS ABS: 0.1 10*3/uL (ref 0–0.1)
Basophils Relative: 2 %
EOS ABS: 0.2 10*3/uL (ref 0–0.7)
EOS PCT: 5 %
HCT: 37.3 % (ref 35.0–47.0)
Hemoglobin: 12.9 g/dL (ref 12.0–16.0)
LYMPHS ABS: 1.2 10*3/uL (ref 1.0–3.6)
LYMPHS PCT: 23 %
MCH: 32.6 pg (ref 26.0–34.0)
MCHC: 34.6 g/dL (ref 32.0–36.0)
MCV: 94.3 fL (ref 80.0–100.0)
MONO ABS: 0.6 10*3/uL (ref 0.2–0.9)
Monocytes Relative: 12 %
Neutro Abs: 3.1 10*3/uL (ref 1.4–6.5)
Neutrophils Relative %: 58 %
PLATELETS: 254 10*3/uL (ref 150–440)
RBC: 3.96 MIL/uL (ref 3.80–5.20)
RDW: 12.9 % (ref 11.5–14.5)
WBC: 5.3 10*3/uL (ref 3.6–11.0)

## 2017-04-09 NOTE — Patient Instructions (Signed)
Your procedure is scheduled on:Apr 16, 2017 (Tuesday) Report to Same Day Surgery 2nd floor medical mall [facility] Entrance-take elevator on left to 2nd floor.  Check in with surgery information desk.) To find out your arrival time please call [phone] between 1PM - 3PM on Apr 15, 2017 (Monday)  Remember: Instructions that are not followed completely may result in serious medical risk, up to and including death, or upon the discretion of your surgeon and anesthesiologist your surgery may need to be rescheduled.    _x___ 1. Do not eat food or drink liquids after midnight. No gum chewing or  hard candies                               __x__ 2. No Alcohol for 24 hours before or after surgery.   __x__3. No Smoking for 24 prior to surgery.   ____  4. Bring all medications with you on the day of surgery if instructed.    __x__ 5. Notify your doctor if there is any change in your medical condition     (cold, fever, infections).     Do not wear jewelry, make-up, hairpins, clips or nail polish.  Do not wear lotions, powders, or perfumes.  Do not shave 48 hours prior to surgery. Men may shave face and neck.  Do not bring valuables to the hospital.    [facility] is not responsible for any belongings or valuables.               Contacts, dentures or bridgework may not be worn into surgery.  Leave your suitcase in the car. After surgery it may be brought to your room.  For patients admitted to the hospital, discharge time is determined by your treatment team                   Patients discharged the day of surgery will not be allowed to drive home.  You will need someone to drive you home and stay with you the night of your procedure.    Please read over the following fact sheets that you were given:   [facility] Preparing for Surgery and or MRSA Information   _x___ Take anti-hypertensive (unless it includes a diuretic), cardiac, seizure, asthma,     anti-reflux and psychiatric  medicines with a sip of water These include:  1. GABAPENTIN  2.  3.  4.  5.  6.  ____Fleets enema or Magnesium Citrate as directed.   _x___ Use CHG Soap or sage wipes as directed on instruction sheet   ____ Use inhalers on the day of surgery and bring to hospital day of surgery  ____ Stop Metformin and Janumet 2 days prior to surgery.    ____ Take 1/2 of usual insulin dose the night before surgery and none on the morning     surgery.   _x___ Follow recommendations from Cardiologist, Pulmonologist or PCP regarding          stopping Aspirin, Coumadin, Pllavix ,Eliquis, Effient, or Pradaxa, and Pletal. (STOP ASPIRIN ONE WEEK BEFORE SURGERY AS INSTRUCTED BY OFFICE)  X____Stop Anti-inflammatories such as Advil, Aleve, Ibuprofen, Motrin, Naproxen, Naprosyn, Goodies powders or aspirin products. OK to take Tylenol (STOP IBUPROFEN NOW)   _x___ Stop supplements until after surgery.  But may continue Vitamin D, Vitamin B, and multivitamin (STOP BIOTIN AND TURMERIC NOW)        ____ Bring C-Pap to  the hospital.

## 2017-04-10 NOTE — Pre-Procedure Instructions (Signed)
EKG COMPARED TO 2012

## 2017-04-16 ENCOUNTER — Ambulatory Visit: Payer: BLUE CROSS/BLUE SHIELD | Admitting: Certified Registered Nurse Anesthetist

## 2017-04-16 ENCOUNTER — Ambulatory Visit
Admission: RE | Admit: 2017-04-16 | Discharge: 2017-04-16 | Disposition: A | Discharge status: Home / Self Care | Payer: BLUE CROSS/BLUE SHIELD | Source: Ambulatory Visit | Attending: Surgery | Admitting: Surgery

## 2017-04-16 ENCOUNTER — Encounter: Payer: Self-pay | Admitting: Certified Registered Nurse Anesthetist

## 2017-04-16 ENCOUNTER — Encounter
Admission: RE | Disposition: A | Discharge status: Home / Self Care | Payer: Self-pay | Source: Ambulatory Visit | Attending: Surgery

## 2017-04-16 DIAGNOSIS — M1990 Unspecified osteoarthritis, unspecified site: Secondary | ICD-10-CM | POA: Diagnosis not present

## 2017-04-16 DIAGNOSIS — C50919 Malignant neoplasm of unspecified site of unspecified female breast: Secondary | ICD-10-CM | POA: Diagnosis not present

## 2017-04-16 DIAGNOSIS — Z452 Encounter for adjustment and management of vascular access device: Secondary | ICD-10-CM | POA: Insufficient documentation

## 2017-04-16 DIAGNOSIS — Z803 Family history of malignant neoplasm of breast: Secondary | ICD-10-CM | POA: Insufficient documentation

## 2017-04-16 DIAGNOSIS — Z79899 Other long term (current) drug therapy: Secondary | ICD-10-CM | POA: Insufficient documentation

## 2017-04-16 DIAGNOSIS — Z8249 Family history of ischemic heart disease and other diseases of the circulatory system: Secondary | ICD-10-CM | POA: Diagnosis not present

## 2017-04-16 DIAGNOSIS — J302 Other seasonal allergic rhinitis: Secondary | ICD-10-CM | POA: Diagnosis not present

## 2017-04-16 DIAGNOSIS — Z7982 Long term (current) use of aspirin: Secondary | ICD-10-CM | POA: Diagnosis not present

## 2017-04-16 DIAGNOSIS — Z9012 Acquired absence of left breast and nipple: Secondary | ICD-10-CM | POA: Insufficient documentation

## 2017-04-16 DIAGNOSIS — I10 Essential (primary) hypertension: Secondary | ICD-10-CM | POA: Insufficient documentation

## 2017-04-16 DIAGNOSIS — G629 Polyneuropathy, unspecified: Secondary | ICD-10-CM | POA: Insufficient documentation

## 2017-04-16 DIAGNOSIS — Z7951 Long term (current) use of inhaled steroids: Secondary | ICD-10-CM | POA: Diagnosis not present

## 2017-04-16 DIAGNOSIS — Z923 Personal history of irradiation: Secondary | ICD-10-CM | POA: Diagnosis not present

## 2017-04-16 DIAGNOSIS — Z9221 Personal history of antineoplastic chemotherapy: Secondary | ICD-10-CM | POA: Diagnosis not present

## 2017-04-16 DIAGNOSIS — Z9889 Other specified postprocedural states: Secondary | ICD-10-CM | POA: Diagnosis not present

## 2017-04-16 DIAGNOSIS — C50412 Malignant neoplasm of upper-outer quadrant of left female breast: Secondary | ICD-10-CM | POA: Diagnosis not present

## 2017-04-16 HISTORY — PX: PORT-A-CATH REMOVAL: SHX5289

## 2017-04-16 SURGERY — REMOVAL PORT-A-CATH
Anesthesia: General | Laterality: Left | Wound class: Clean

## 2017-04-16 MED ORDER — HEPARIN SODIUM (PORCINE) 5000 UNIT/ML IJ SOLN
5000.0000 [IU] | Freq: Once | INTRAMUSCULAR | Status: AC
  Administered 2017-04-16: 5000 [IU] via SUBCUTANEOUS

## 2017-04-16 MED ORDER — HEPARIN SODIUM (PORCINE) 5000 UNIT/ML IJ SOLN
INTRAMUSCULAR | Status: AC
  Administered 2017-04-16: 5000 [IU] via SUBCUTANEOUS
  Filled 2017-04-16: qty 1

## 2017-04-16 MED ORDER — CHLORHEXIDINE GLUCONATE CLOTH 2 % EX PADS: 6.0000 | MEDICATED_PAD | Freq: Once | CUTANEOUS | Status: DC

## 2017-04-16 MED ORDER — FENTANYL CITRATE (PF) 100 MCG/2ML IJ SOLN: 25.0000 ug | INTRAMUSCULAR | Status: DC | PRN

## 2017-04-16 MED ORDER — CEFAZOLIN SODIUM-DEXTROSE 2-4 GM/100ML-% IV SOLN
2.0000 g | INTRAVENOUS | Status: AC
  Administered 2017-04-16: 2 g via INTRAVENOUS

## 2017-04-16 MED ORDER — DEXAMETHASONE SODIUM PHOSPHATE 10 MG/ML IJ SOLN
INTRAMUSCULAR | Status: AC
  Filled 2017-04-16: qty 1

## 2017-04-16 MED ORDER — CEFAZOLIN SODIUM-DEXTROSE 2-4 GM/100ML-% IV SOLN
INTRAVENOUS | Status: AC
  Filled 2017-04-16: qty 100

## 2017-04-16 MED ORDER — DEXAMETHASONE SODIUM PHOSPHATE 10 MG/ML IJ SOLN
INTRAMUSCULAR | Status: DC | PRN
  Administered 2017-04-16: 10 mg via INTRAVENOUS

## 2017-04-16 MED ORDER — MIDAZOLAM HCL 2 MG/2ML IJ SOLN
INTRAMUSCULAR | Status: DC | PRN
  Administered 2017-04-16: 2 mg via INTRAVENOUS

## 2017-04-16 MED ORDER — FENTANYL CITRATE (PF) 100 MCG/2ML IJ SOLN
INTRAMUSCULAR | Status: AC
  Filled 2017-04-16: qty 2

## 2017-04-16 MED ORDER — ONDANSETRON HCL 4 MG/2ML IJ SOLN
INTRAMUSCULAR | Status: AC
  Filled 2017-04-16: qty 2

## 2017-04-16 MED ORDER — FAMOTIDINE 20 MG PO TABS
ORAL_TABLET | ORAL | Status: AC
  Administered 2017-04-16: 20 mg via ORAL
  Filled 2017-04-16: qty 1

## 2017-04-16 MED ORDER — BUPIVACAINE-EPINEPHRINE (PF) 0.25% -1:200000 IJ SOLN
INTRAMUSCULAR | Status: DC | PRN
  Administered 2017-04-16: 13 mL

## 2017-04-16 MED ORDER — CHLORHEXIDINE GLUCONATE CLOTH 2 % EX PADS
6.0000 | MEDICATED_PAD | Freq: Once | CUTANEOUS | Status: AC
  Administered 2017-04-16: 6 via TOPICAL

## 2017-04-16 MED ORDER — PROPOFOL 500 MG/50ML IV EMUL
INTRAVENOUS | Status: DC | PRN
  Administered 2017-04-16: 75 ug/kg/min via INTRAVENOUS

## 2017-04-16 MED ORDER — PROPOFOL 10 MG/ML IV BOLUS
INTRAVENOUS | Status: AC
  Filled 2017-04-16: qty 20

## 2017-04-16 MED ORDER — ONDANSETRON HCL 4 MG/2ML IJ SOLN
INTRAMUSCULAR | Status: DC | PRN
  Administered 2017-04-16: 4 mg via INTRAVENOUS

## 2017-04-16 MED ORDER — BUPIVACAINE-EPINEPHRINE (PF) 0.25% -1:200000 IJ SOLN
INTRAMUSCULAR | Status: AC
  Filled 2017-04-16: qty 30

## 2017-04-16 MED ORDER — FAMOTIDINE 20 MG PO TABS
20.0000 mg | ORAL_TABLET | Freq: Once | ORAL | Status: AC
  Administered 2017-04-16: 20 mg via ORAL

## 2017-04-16 MED ORDER — LIDOCAINE HCL (PF) 2 % IJ SOLN
INTRAMUSCULAR | Status: AC
  Filled 2017-04-16: qty 2

## 2017-04-16 MED ORDER — LACTATED RINGERS IV SOLN
INTRAVENOUS | Status: DC
  Administered 2017-04-16: 13:00:00 via INTRAVENOUS

## 2017-04-16 MED ORDER — FENTANYL CITRATE (PF) 100 MCG/2ML IJ SOLN
INTRAMUSCULAR | Status: DC | PRN
  Administered 2017-04-16 (×2): 25 ug via INTRAVENOUS

## 2017-04-16 MED ORDER — LIDOCAINE HCL (CARDIAC) 20 MG/ML IV SOLN
INTRAVENOUS | Status: DC | PRN
  Administered 2017-04-16: 80 mg via INTRAVENOUS

## 2017-04-16 MED ORDER — ONDANSETRON HCL 4 MG/2ML IJ SOLN: 4.0000 mg | Freq: Once | INTRAMUSCULAR | Status: DC | PRN

## 2017-04-16 MED ORDER — MIDAZOLAM HCL 2 MG/2ML IJ SOLN
INTRAMUSCULAR | Status: AC
  Filled 2017-04-16: qty 2

## 2017-04-16 SURGICAL SUPPLY — 19 items
ADHESIVE MASTISOL STRL (MISCELLANEOUS) ×2 IMPLANT
CANISTER SUCT 1200ML W/VALVE (MISCELLANEOUS) ×2 IMPLANT
CHLORAPREP W/TINT 26ML (MISCELLANEOUS) ×2 IMPLANT
ELECT REM PT RETURN 9FT ADLT (ELECTROSURGICAL) ×2
ELECTRODE REM PT RTRN 9FT ADLT (ELECTROSURGICAL) ×1 IMPLANT
GAUZE SPONGE 4X4 12PLY STRL (GAUZE/BANDAGES/DRESSINGS) ×2 IMPLANT
GLOVE BIO SURGEON STRL SZ8 (GLOVE) ×8 IMPLANT
GOWN STRL REUS W/ TWL LRG LVL3 (GOWN DISPOSABLE) ×2 IMPLANT
GOWN STRL REUS W/TWL LRG LVL3 (GOWN DISPOSABLE) ×2
KIT RM TURNOVER STRD PROC AR (KITS) ×2 IMPLANT
LABEL OR SOLS (LABEL) ×2 IMPLANT
NEEDLE HYPO 25X1 1.5 SAFETY (NEEDLE) ×2 IMPLANT
PACK BASIN MINOR ARMC (MISCELLANEOUS) ×2 IMPLANT
SPONGE LAP 18X18 5 PK (GAUZE/BANDAGES/DRESSINGS) ×2 IMPLANT
STRIP CLOSURE SKIN 1/2X4 (GAUZE/BANDAGES/DRESSINGS) ×2 IMPLANT
SUT MNCRL AB 4-0 PS2 18 (SUTURE) ×2 IMPLANT
SUT VIC AB 3-0 SH 27 (SUTURE) ×1
SUT VIC AB 3-0 SH 27X BRD (SUTURE) ×1 IMPLANT
SYR 20CC LL (SYRINGE) ×2 IMPLANT

## 2017-04-16 NOTE — Discharge Instructions (Signed)
AMBULATORY SURGERY  DISCHARGE INSTRUCTIONS   1) The drugs that you were given will stay in your system until tomorrow so for the next 24 hours you should not:  A) Drive an automobile B) Make any legal decisions C) Drink any alcoholic beverage   2) You may resume regular meals tomorrow.  Today it is better to start with liquids and gradually work up to solid foods.  You may eat anything you prefer, but it is better to start with liquids, then soup and crackers, and gradually work up to solid foods.   3) Please notify your doctor immediately if you have any unusual bleeding, trouble breathing, redness and pain at the surgery site, drainage, fever, or pain not relieved by medication.   4) Additional Instructions: Remove dressing tomorrow and may shower.  No swimming 2 weeks.  Water waste deep only until then

## 2017-04-16 NOTE — Anesthesia Postprocedure Evaluation (Signed)
Anesthesia Post Note  Patient: Brittany Ryan  Procedure(s) Performed: Procedure(s) (LRB): REMOVAL PORT-A-CATH (Left)  Patient location during evaluation: PACU Anesthesia Type: General Level of consciousness: awake and alert Pain management: pain level controlled Vital Signs Assessment: post-procedure vital signs reviewed and stable Respiratory status: spontaneous breathing and respiratory function stable Cardiovascular status: stable Anesthetic complications: no     Last Vitals:  Vitals:   04/16/17 1223 04/16/17 1318  BP: (!) 148/84 106/76  Pulse: 77 72  Resp: 20 12  Temp: 36.8 C 36.6 C    Last Pain:  Vitals:   04/16/17 1223  TempSrc: Oral                 KEPHART,WILLIAM K

## 2017-04-16 NOTE — Op Note (Signed)
04/16/2017  1:44 PM  PATIENT:  Brittany Ryan  56 y.o. female  PRE-OPERATIVE DIAGNOSIS:  Breast cancer  POST-OPERATIVE DIAGNOSIS:  Same  PROCEDURE: Port removal  SURGEON:  Florene Glen MD, FACS   ANESTHESIA:   Local/mac   Details of Procedure: This patient with breast cancer requesting port removal. Preoperatively discussed rationale for surgery the options of observation risk bleeding infection and the need for [facility] she understood and agreed to proceed.  After inducing the patient to monitored anesthesia care and prepping and draping in a sterile fashion a 2 surgical time out was held.  Local anesthetic was infiltrated into the skin and subcutaneous tissues tissues around the palpable port in the left chest. The old incision scar was opened sharply and dissection down to the Prolene sutures was performed. Prolene sutures were cut the capsule was opened and the port was removed pressure was held. Additional Marcaine was placed and once assuring hemostasis was adequate. The wound was closed with layers of 3-0 Vicryl and 4-0 Monocryl Steri-Strips Mastisol and sterile dressings were placed. Patient called tolerated the procedure well the workup occasion she was taken to recovery room in stable condition to be discharged care of her family and follow-up in 55 days   Florene Glen, MD FACS

## 2017-04-16 NOTE — Anesthesia Preprocedure Evaluation (Signed)
Anesthesia Evaluation  Patient identified by MRN, date of birth, ID band Patient awake    Reviewed: Allergy & Precautions, NPO status , Patient's Chart, lab work & pertinent test results  Airway Mallampati: III       Dental   Pulmonary neg pulmonary ROS,           Cardiovascular hypertension (pt denies), negative cardio ROS       Neuro/Psych negative neurological ROS     GI/Hepatic negative GI ROS, Neg liver ROS,   Endo/Other  negative endocrine ROS  Renal/GU negative Renal ROS     Musculoskeletal   Abdominal   Peds  Hematology   Anesthesia Other Findings   Reproductive/Obstetrics                             Anesthesia Physical Anesthesia Plan  ASA: II  Anesthesia Plan: General   Post-op Pain Management:    Induction: Intravenous  Airway Management Planned:   Additional Equipment:   Intra-op Plan:   Post-operative Plan:   Informed Consent: I have reviewed the patients History and Physical, chart, labs and discussed the procedure including the risks, benefits and alternatives for the proposed anesthesia with the patient or authorized representative who has indicated his/her understanding and acceptance.     Plan Discussed with:   Anesthesia Plan Comments:         Anesthesia Quick Evaluation

## 2017-04-16 NOTE — Anesthesia Post-op Follow-up Note (Cosign Needed)
Anesthesia QCDR form completed.        

## 2017-04-16 NOTE — Transfer of Care (Signed)
Immediate Anesthesia Transfer of Care Note  Patient: Brittany Ryan  Procedure(s) Performed: Procedure(s): REMOVAL PORT-A-CATH (Left)  Patient Location: PACU  Anesthesia Type:General  Level of Consciousness: awake, alert  and oriented  Airway & Oxygen Therapy: Patient Spontanous Breathing and Patient connected to nasal cannula oxygen  Post-op Assessment: Report given to RN and Post -op Vital signs reviewed and stable  Post vital signs: Reviewed and stable  Last Vitals:  Vitals:   04/16/17 1223  BP: (!) 148/84  Pulse: 77  Resp: 20  Temp: 36.8 C    Last Pain:  Vitals:   04/16/17 1223  TempSrc: Oral         Complications: No apparent anesthesia complications

## 2017-04-16 NOTE — Progress Notes (Signed)
Preoperative Review   Patient is met in the preoperative holding area. The history is reviewed in the chart and with the patient. I personally reviewed the options and rationale as well as the risks of this procedure that have been previously discussed with the patient. All questions asked by the patient and/or family were answered to their satisfaction.  Patient agrees to proceed with this procedure at this time.  Eliz Nigg E Cailan General M.D. FACS  

## 2017-04-16 NOTE — Anesthesia Procedure Notes (Signed)
Date/Time: 04/16/2017 12:57 PM Performed by: Johnna Acosta Pre-anesthesia Checklist: Patient identified, Emergency Drugs available, Suction available, Patient being monitored and Timeout performed Patient Re-evaluated:Patient Re-evaluated prior to inductionOxygen Delivery Method: Nasal cannula

## 2017-05-02 ENCOUNTER — Ambulatory Visit
Admission: EM | Admit: 2017-05-02 | Discharge: 2017-05-02 | Disposition: A | Discharge status: Home / Self Care | Payer: BLUE CROSS/BLUE SHIELD | Attending: Family Medicine | Admitting: Family Medicine

## 2017-05-02 DIAGNOSIS — N390 Urinary tract infection, site not specified: Secondary | ICD-10-CM

## 2017-05-02 DIAGNOSIS — R319 Hematuria, unspecified: Secondary | ICD-10-CM

## 2017-05-02 DIAGNOSIS — N3000 Acute cystitis without hematuria: Secondary | ICD-10-CM | POA: Diagnosis not present

## 2017-05-02 LAB — URINALYSIS, COMPLETE (UACMP) WITH MICROSCOPIC

## 2017-05-02 MED ORDER — PHENAZOPYRIDINE HCL 200 MG PO TABS: 200.0000 mg | ORAL_TABLET | Freq: Three times a day (TID) | ORAL | 0 refills | Status: DC

## 2017-05-02 MED ORDER — FLUCONAZOLE 150 MG PO TABS: 150.0000 mg | ORAL_TABLET | Freq: Once | ORAL | 0 refills | Status: AC

## 2017-05-02 MED ORDER — SULFAMETHOXAZOLE-TRIMETHOPRIM 800-160 MG PO TABS: 1.0000 | ORAL_TABLET | Freq: Two times a day (BID) | ORAL | 0 refills | Status: DC

## 2017-05-02 NOTE — ED Triage Notes (Signed)
Patient complains of urinary frequency, urgency. Patient states that she also has sinus pain and pressure. Patient states that she has had symptoms for over 1 week. Has taken Uristat over the last week.

## 2017-05-02 NOTE — ED Provider Notes (Signed)
[facility]    CSN: [id number] Arrival date & time: 05/02/17  6659     History   Chief Complaint Chief Complaint  Patient presents with  . Urinary Frequency  . Sinusitis    HPI Brittany Ryan is a 56 y.o. female.   She is here because of symptoms of a UTI. She states that everything started last week Thursday. She reports burning urination frequency feeling miserable. Because of her symptoms she started using Uristat over-the-counter. She reports not feeling that much better but she had it filled the second time she figured she need to be seen and be evaluated. She denies any fever just burning and frequency. She had a history of breast cancer and recently got through with chemotherapy following a lumpectomy. She is on no other chronic medications other than medication for neuropathy. She does not smoke or never smoked no known drug allergies and family medical history is positive for cancer hypertension and heart disease in her father and breast cancer in a cousin.   The history is provided by the patient. No language interpreter was used.  Dysuria  Pain quality:  Burning Pain severity:  Moderate Duration:  7 days Timing:  Constant Progression:  Unchanged Chronicity:  New Recent urinary tract infections: no   Relieved by:  Nothing Worsened by:  Nothing Ineffective treatments:  Phenazopyridine Urinary symptoms: discolored urine and frequent urination   Associated symptoms: no abdominal pain, no fever, no flank pain, no genital lesions and no vomiting     Past Medical History:  Diagnosis Date  . Arthritis   . Breast cancer ([place]) 2016   LT LUMPECTOMY  . Frequent sinus infections   . History of chemotherapy 2016   BREAST CA  . Hypertension   . Neuropathy   . Radiation 2016-17   BREAST CA  . Seasonal allergies     Patient Active Problem List   Diagnosis Date Noted  . Malignant neoplasm of female breast ([place])   . Seasonal allergies   . Hx of  radiation therapy   . Hypertension   . Frequent sinus infections   . Primary cancer of upper outer quadrant of left female breast ([place])   . History of chemotherapy 12/03/2014    Past Surgical History:  Procedure Laterality Date  . BREAST BIOPSY Left 04/27/2015   POS  . BREAST LUMPECTOMY WITH NEEDLE LOCALIZATION Left 05/19/2015   Procedure: BREAST LUMPECTOMY WITH NEEDLE LOCALIZATION;  Surgeon: Florene Glen, MD;  Location: [facility] ORS;  Service: General;  Laterality: Left;  . BREAST LUMPECTOMY WITH SENTINEL LYMPH NODE BIOPSY Left 05/19/2015   Procedure: BREAST LUMPECTOMY WITH SENTINEL LYMPH NODE BX;  Surgeon: Florene Glen, MD;  Location: [facility] OR[facility];  Service: General;  Laterality: Left;  Marland Kitchen MASTECTOMY, PARTIAL Left 05/19/2015   Procedure: MASTECTOMY PARTIAL;  Surgeon: Florene Glen, MD;  Location: [facility] OR[facility];  Service: General;  Laterality: Left;  . PORT-A-CATH REMOVAL Left 04/16/2017   Procedure: REMOVAL PORT-A-CATH;  Surgeon: Florene Glen, MD;  Location: [facility] OR[facility];  Service: General;  Laterality: Left;  . PORTACATH PLACEMENT Left 05/19/2015   Procedure: INSERTION PORT-A-CATH;  Surgeon: Florene Glen, MD;  Location: [facility];  Service: General;  Laterality: Left;    OB History    No data available       Home Medications    Prior to Admission medications   Medication Sig Start Date End Date Taking? Authorizing Provider  acetaminophen (TYLENOL) 500 MG tablet Take 1,000 mg  by mouth every 6 (six) hours as needed for mild pain or headache.   Yes [provider]  aspirin 81 MG chewable tablet Chew 81 mg by mouth daily.   Yes [provider]  B Complex Vitamins (VITAMIN B COMPLEX PO) Take 1 tablet by mouth daily as needed.   Yes [provider]  BIOTIN PO Take 1 tablet by mouth daily.   Yes [provider]  CALCIUM PO Take 1 tablet by mouth daily.   Yes [provider]  Cholecalciferol (VITAMIN D3 PO) Take 1 tablet by mouth daily.   Yes  [provider]  gabapentin (NEURONTIN) 300 MG capsule Take 1 capsule (300 mg total) by mouth 3 (three) times daily. 04/08/17  Yes Lloyd Huger, MD  ibuprofen (ADVIL,MOTRIN) 200 MG tablet Take 400 mg by mouth every 8 (eight) hours as needed (for pain/headaches.).    Yes [provider]  TURMERIC PO Take 1 capsule by mouth daily.   Yes [provider]  zolpidem (AMBIEN) 5 MG tablet Take 1 tablet (5 mg total) by mouth at bedtime as needed. for sleep 02/22/17  Yes Lloyd Huger, MD  fluconazole (DIFLUCAN) 150 MG tablet Take 1 tablet (150 mg total) by mouth once. 05/02/17 05/02/17  Frederich Cha, MD  fluticasone (FLONASE) 50 MCG/ACT nasal spray Place 1 spray into both nostrils as needed.     [provider]  lidocaine-prilocaine (EMLA) cream Apply 1 application topically as needed. Apply to port and cover with saran wrap 1-2 hours before chemotherapy Patient taking differently: Apply 1 application topically daily as needed. Prior to port flushing/accessed 06/01/15   Lloyd Huger, MD  loratadine (CLARITIN) 10 MG tablet Take 10 mg by mouth daily as needed (for allergies.).     [provider]  phenazopyridine (PYRIDIUM) 200 MG tablet Take 1 tablet (200 mg total) by mouth 3 (three) times daily. 05/02/17   Frederich Cha, MD  polyethylene glycol powder (GLYCOLAX/MIRALAX) powder Take 17 g by mouth daily as needed (for constipation).     [provider]  pregabalin (LYRICA) 75 MG capsule Take one capsule (75mg ) daily Patient not taking: Reported on 04/05/2017 02/22/17   Lloyd Huger, MD  sulfamethoxazole-trimethoprim (BACTRIM DS,SEPTRA DS) 800-160 MG tablet Take 1 tablet by mouth 2 (two) times daily. 05/02/17   Frederich Cha, MD    Family History Family History  Problem Relation Age of Onset  . Cancer Father   . Hypertension Father   . CAD Father   . Heart attack Father   . Breast cancer Cousin     Social History Social History    Substance Use Topics  . Smoking status: Never Smoker  . Smokeless tobacco: Never Used  . Alcohol use Yes     Comment: rare beer     Allergies   Patient has no known allergies.   Review of Systems Review of Systems  Constitutional: Negative for fever.  HENT: Positive for postnasal drip, sinus pressure and sneezing.   Gastrointestinal: Negative for abdominal pain and vomiting.  Genitourinary: Positive for dysuria. Negative for flank pain.  All other systems reviewed and are negative.    Physical Exam Triage Vital Signs ED Triage Vitals  Enc Vitals Group     BP 05/02/17 0900 (!) 148/84     Pulse Rate 05/02/17 0900 76     Resp 05/02/17 0900 18     Temp 05/02/17 0900 98.2 F (36.8 C)     Temp Source 05/02/17  0900 Oral     SpO2 05/02/17 0900 96 %     Weight 05/02/17 0859 210 lb (95.3 kg)     Height 05/02/17 0859 5\' 9"  (1.753 m)     Head Circumference --      Peak Flow --      Pain Score 05/02/17 0859 8     Pain Loc --      Pain Edu? --      Excl. in [place]? --    No data found.   Updated Vital Signs BP (!) 148/84 (BP Location: Left Arm)   Pulse 76   Temp 98.2 F (36.8 C) (Oral)   Resp 18   Ht 5\' 9"  (1.753 m)   Wt 210 lb (95.3 kg)   SpO2 96%   BMI 31.01 kg/m   Visual Acuity Right Eye Distance:   Left Eye Distance:   Bilateral Distance:    Right Eye Near:   Left Eye Near:    Bilateral Near:     Physical Exam  Constitutional: She is oriented to person, place, and time. She appears well-developed and well-nourished.  HENT:  Head: Normocephalic and atraumatic.  Right Ear: External ear normal.  Left Ear: External ear normal.  Eyes: Pupils are equal, round, and reactive to light.  Neck: Normal range of motion. Neck supple.  Pulmonary/Chest: Breath sounds normal.  Abdominal: Soft. She exhibits no distension. There is no tenderness.  Musculoskeletal: Normal range of motion.  Neurological: She is alert and oriented to person, place, and time.  Skin: Skin is  warm.  Psychiatric: She has a normal mood and affect. Her behavior is normal.  Vitals reviewed.    UC Treatments / Results  Labs (all labs ordered are listed, but only abnormal results are displayed) Labs Reviewed  URINALYSIS, COMPLETE (UACMP) WITH MICROSCOPIC - Abnormal; Notable for the following:       Result Value   Color, Urine ORANGE (*)    APPearance CLOUDY (*)    Glucose, UA   (*)    Value: TEST NOT REPORTED DUE TO COLOR INTERFERENCE OF URINE PIGMENT   Hgb urine dipstick   (*)    Value: TEST NOT REPORTED DUE TO COLOR INTERFERENCE OF URINE PIGMENT   Bilirubin Urine   (*)    Value: TEST NOT REPORTED DUE TO COLOR INTERFERENCE OF URINE PIGMENT   Ketones, ur   (*)    Value: TEST NOT REPORTED DUE TO COLOR INTERFERENCE OF URINE PIGMENT   Protein, ur   (*)    Value: TEST NOT REPORTED DUE TO COLOR INTERFERENCE OF URINE PIGMENT   Nitrite   (*)    Value: TEST NOT REPORTED DUE TO COLOR INTERFERENCE OF URINE PIGMENT   Leukocytes, UA   (*)    Value: TEST NOT REPORTED DUE TO COLOR INTERFERENCE OF URINE PIGMENT   Squamous Epithelial / LPF 0-5 (*)    Bacteria, UA MANY (*)    All other components within normal limits  URINE CULTURE    EKG  EKG Interpretation None       Radiology No results found.  Procedures Procedures (including critical care time)  Medications Ordered in UC Medications - No data to display  Results for orders placed or performed during the hospital encounter of 05/02/17  Urinalysis, Complete w Microscopic  Result Value Ref Range   Color, Urine ORANGE (A) YELLOW   APPearance CLOUDY (A) CLEAR   Specific Gravity, Urine  1.005 - 1.030    TEST NOT  REPORTED DUE TO COLOR INTERFERENCE OF URINE PIGMENT   pH  5.0 - 8.0    TEST NOT REPORTED DUE TO COLOR INTERFERENCE OF URINE PIGMENT   Glucose, UA (A) NEGATIVE mg/dL    TEST NOT REPORTED DUE TO COLOR INTERFERENCE OF URINE PIGMENT   Hgb urine dipstick (A) NEGATIVE    TEST NOT REPORTED DUE TO COLOR INTERFERENCE  OF URINE PIGMENT   Bilirubin Urine (A) NEGATIVE    TEST NOT REPORTED DUE TO COLOR INTERFERENCE OF URINE PIGMENT   Ketones, ur (A) NEGATIVE mg/dL    TEST NOT REPORTED DUE TO COLOR INTERFERENCE OF URINE PIGMENT   Protein, ur (A) NEGATIVE mg/dL    TEST NOT REPORTED DUE TO COLOR INTERFERENCE OF URINE PIGMENT   Nitrite (A) NEGATIVE    TEST NOT REPORTED DUE TO COLOR INTERFERENCE OF URINE PIGMENT   Leukocytes, UA (A) NEGATIVE    TEST NOT REPORTED DUE TO COLOR INTERFERENCE OF URINE PIGMENT   Squamous Epithelial / LPF 0-5 (A) NONE SEEN   WBC, UA 6-30 0 - 5 WBC/hpf   RBC / HPF 0-5 0 - 5 RBC/hpf   Bacteria, UA MANY (A) NONE SEEN   Initial Impression / Assessment and Plan / UC Course  I have reviewed the triage vital signs and the nursing notes.  Pertinent labs & imaging results that were available during my care of the patient were reviewed by me and considered in my medical decision making (see chart for details).    due to her sinus symptoms in fact the urine is grossly abnormal despite the presence of the Azo discoloring her urine will place on Septra DS 1 tablet daily for 1 week Pyridium 1 tablet 3 times a day for 5 days she can take a second Azo. Because she may get yeast infections will also give her prescription for Diflucan 1 tablet now and  repeat in a week if needed. Follow-up with her PCP in 3 weeks dated  Final Clinical Impressions(s) / UC Diagnoses   Final diagnoses:  Urinary tract infection with hematuria, site unspecified  Acute cystitis without hematuria    New Prescriptions New Prescriptions   FLUCONAZOLE (DIFLUCAN) 150 MG TABLET    Take 1 tablet (150 mg total) by mouth once.   PHENAZOPYRIDINE (PYRIDIUM) 200 MG TABLET    Take 1 tablet (200 mg total) by mouth 3 (three) times daily.   SULFAMETHOXAZOLE-TRIMETHOPRIM (BACTRIM DS,SEPTRA DS) 800-160 MG TABLET    Take 1 tablet by mouth 2 (two) times daily.    Note: This dictation was prepared with Dragon dictation along with  smaller phrase technology. Any transcriptional errors that result from this process are unintentional.   Frederich Cha, MD 05/02/17 [phone]

## 2017-05-04 LAB — URINE CULTURE: Culture: 80000 — AB

## 2017-05-16 ENCOUNTER — Ambulatory Visit
Admission: RE | Admit: 2017-05-16 | Discharge: 2017-05-16 | Disposition: A | Discharge status: Home / Self Care | Payer: BLUE CROSS/BLUE SHIELD | Source: Ambulatory Visit | Attending: Oncology | Admitting: Oncology

## 2017-05-16 DIAGNOSIS — Z853 Personal history of malignant neoplasm of breast: Secondary | ICD-10-CM | POA: Diagnosis not present

## 2017-05-16 DIAGNOSIS — C50412 Malignant neoplasm of upper-outer quadrant of left female breast: Secondary | ICD-10-CM

## 2017-05-16 DIAGNOSIS — Z9889 Other specified postprocedural states: Secondary | ICD-10-CM | POA: Insufficient documentation

## 2017-05-17 ENCOUNTER — Inpatient Hospital Stay: Payer: BLUE CROSS/BLUE SHIELD

## 2017-06-28 ENCOUNTER — Inpatient Hospital Stay: Payer: BLUE CROSS/BLUE SHIELD

## 2017-07-12 ENCOUNTER — Other Ambulatory Visit: Payer: Self-pay

## 2017-07-12 MED ORDER — ZOLPIDEM TARTRATE 5 MG PO TABS: 5.0000 mg | ORAL_TABLET | Freq: Every evening | ORAL | 3 refills | Status: DC | PRN

## 2017-08-20 NOTE — Progress Notes (Signed)
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  INTERVAL HISTORY: Patient returns to clinic today for routine 6 month evaluation. She continues to feel well and is asymptomatic. She continues to have a mild peripheral neuropathy but does not affect her day-to-day activity. She has no other neurologic complaints. She denies any recent fevers.  She has a good appetite and denies weight loss. She denies any pain. She has no chest pain or shortness of breath. She denies any nausea, vomiting, constipation, or diarrhea. She has no urinary complaints. Patient offers no further specific complaints today.  REVIEW OF SYSTEMS:   Review of Systems  Constitutional: Negative.  Negative for fever, malaise/fatigue and weight loss.  Respiratory: Negative.  Negative for shortness of breath.   Cardiovascular: Negative.  Negative for chest pain and leg swelling.  Gastrointestinal: Negative.  Negative for abdominal pain.  Genitourinary: Negative.   Musculoskeletal: Negative.   Skin: Negative.  Negative for rash.  Neurological: Positive for sensory change. Negative for weakness.  Endo/Heme/Allergies: Does not bruise/bleed easily.  Psychiatric/Behavioral: The patient is not nervous/anxious and does not have insomnia.     As per HPI. Otherwise, a complete review of systems is negative.  PAST MEDICAL HISTORY: Past Medical History:  Diagnosis Date  . Arthritis   . Breast cancer ([place]) 2016   LT LUMPECTOMY  . Frequent sinus infections   . History of chemotherapy 2016   BREAST CA  . Hypertension   . Neuropathy   . Radiation  2016-17   BREAST CA  . Seasonal allergies     PAST SURGICAL HISTORY: Past Surgical History:  Procedure Laterality Date  . BREAST BIOPSY Left 04/27/2015   POS  . BREAST LUMPECTOMY WITH NEEDLE LOCALIZATION Left 05/19/2015   Procedure: BREAST LUMPECTOMY WITH NEEDLE LOCALIZATION;  Surgeon: Florene Glen, MD;  Location: [facility] ORS;  Service: General;  Laterality: Left;  . BREAST LUMPECTOMY WITH SENTINEL LYMPH NODE BIOPSY Left 05/19/2015   Procedure: BREAST LUMPECTOMY WITH SENTINEL LYMPH NODE BX;  Surgeon: Florene Glen, MD;  Location: [facility] ORS;  Service: General;  Laterality: Left;  Marland Kitchen MASTECTOMY, PARTIAL Left 05/19/2015   Procedure: MASTECTOMY PARTIAL;  Surgeon: Florene Glen, MD;  Location: [facility] ORS;  Service: General;  Laterality: Left;  . PORT-A-CATH REMOVAL Left 04/16/2017   Procedure: REMOVAL PORT-A-CATH;  Surgeon: Florene Glen, MD;  Location: [facility] ORS;  Service: General;  Laterality: Left;  . PORTACATH PLACEMENT Left 05/19/2015   Procedure: INSERTION PORT-A-CATH;  Surgeon: Florene Glen, MD;  Location: [facility] ORS;  Service: General;  Laterality: Left;    FAMILY HISTORY Family History  Problem Relation Age of Onset  . Cancer Father   . Hypertension Father   . CAD Father   . Heart attack Father   . Breast cancer Cousin   . Breast cancer Sister 58       ADVANCED DIRECTIVES:    HEALTH MAINTENANCE: Social History  Substance Use Topics  . Smoking status: Never Smoker  . Smokeless tobacco: Never Used  . Alcohol use Yes     Comment: rare beer     Colonoscopy:  PAP:  Bone density:  Lipid panel:  No Known Allergies  Current Outpatient Prescriptions  Medication Sig Dispense Refill  . acetaminophen (TYLENOL) 500 MG tablet Take 1,000 mg by mouth every 6 (six) hours as needed for mild pain or headache.    . loratadine (CLARITIN) 10 MG tablet Take 10 mg by mouth daily as needed (for allergies.).     Marland Kitchen TURMERIC PO Take 1 capsule by mouth daily.    Marland Kitchen zolpidem (AMBIEN)  5 MG tablet Take 1 tablet (5 mg total) by mouth at bedtime as needed. for sleep 30 tablet 3  . aspirin 81 MG chewable tablet Chew 81 mg by mouth daily.    . B Complex Vitamins (VITAMIN B COMPLEX PO) Take 1 tablet by mouth daily as needed.    Marland Kitchen BIOTIN PO Take 1 tablet by mouth daily.    Marland Kitchen CALCIUM PO Take 1 tablet by mouth daily.    . Cholecalciferol (VITAMIN D3 PO) Take 1 tablet by mouth daily.    . fluticasone (FLONASE) 50 MCG/ACT nasal spray Place 1 spray into both nostrils as needed.     . gabapentin (NEURONTIN) 300 MG capsule Take 1 capsule (300 mg total) by mouth 3 (three) times daily. 90 capsule 2  . ibuprofen (ADVIL,MOTRIN) 200 MG tablet Take 400 mg by mouth every 8 (eight) hours as needed (for pain/headaches.).     Marland Kitchen lidocaine-prilocaine (EMLA) cream Apply 1 application topically as needed. Apply to port and cover with saran wrap 1-2 hours before chemotherapy (Patient not taking: Reported on 08/23/2017) 30 g 2  . phenazopyridine (PYRIDIUM) 200 MG tablet Take 1 tablet (200 mg total) by mouth 3 (three) times daily. (Patient not taking: Reported on 08/23/2017) 15 tablet 0  . polyethylene glycol powder (GLYCOLAX/MIRALAX) powder Take 17 g by mouth daily as needed (for constipation).     Marland Kitchen sulfamethoxazole-trimethoprim (BACTRIM DS,SEPTRA DS) 800-160 MG tablet Take 1 tablet by mouth 2 (two) times daily. (Patient not taking: Reported on 08/23/2017) 14 tablet 0   No current facility-administered medications for this visit.    Facility-Administered Medications Ordered in Other Visits  Medication Dose Route Frequency Provider Last Rate Last Dose  . heparin lock flush 100 unit/mL  500 Units Intravenous Once Lloyd Huger, MD      . heparin lock flush 100 unit/mL  500 Units Intravenous Once Lloyd Huger, MD      . heparin lock flush 100 unit/mL  500 Units Intracatheter Once PRN Lloyd Huger, MD      . sodium chloride 0.9 % injection 10 mL  10 mL Intracatheter PRN Lloyd Huger,  MD   10 mL at 07/22/15 0956  . sodium chloride 0.9 % injection 10 mL  10 mL Intravenous PRN Lloyd Huger, MD   10 mL at 09/02/15 0941  . sodium chloride 0.9 % injection 10 mL  10 mL Intravenous PRN Lloyd Huger, MD      . sodium chloride 0.9 % injection 10 mL  10 mL Intracatheter PRN Lloyd Huger, MD   10 mL at 09/23/15 1205  . sodium chloride 0.9 % injection 10 mL  10 mL Intracatheter PRN Lloyd Huger, MD   10 mL at 09/30/15 0900    OBJECTIVE: Vitals:   08/23/17 1007  BP: (!) 150/102  Pulse: 67  Resp: 18  Temp: (!) 97.5 F (36.4 C)     Body mass index is 31.6 kg/m.    ECOG FS:0 - Asymptomatic  General: Well-developed, well-nourished, no acute distress. Eyes:  anicteric sclera. Breasts: Bilateral breast and axilla without lumps or masses. Patient requested exam be deferred today. Lungs: Clear to auscultation bilaterally. Heart: Regular rate and rhythm. No rubs, murmurs, or gallops. Abdomen: Soft, nontender, nondistended. No organomegaly noted, normoactive bowel sounds. Musculoskeletal: No edema, cyanosis, or clubbing. Neuro: Alert, answering all questions appropriately. Cranial nerves grossly intact. Skin: No rashes or petechiae noted. Psych: Normal affect.   LAB RESULTS:  Lab Results  Component Value Date   NA 140 04/09/2017   K 4.1 04/09/2017   CL 103 04/09/2017   CO2 30 04/09/2017   GLUCOSE 98 04/09/2017   BUN 16 04/09/2017   CREATININE 0.63 04/09/2017   CALCIUM 9.4 04/09/2017   PROT 6.9 01/11/2016   ALBUMIN 3.9 01/11/2016   AST 21 01/11/2016   ALT 22 01/11/2016   ALKPHOS 65 01/11/2016   BILITOT 0.6 01/11/2016   GFRNONAA >60 04/09/2017   GFRAA >60 04/09/2017    Lab Results  Component Value Date   WBC 5.3 04/09/2017   NEUTROABS 3.1 04/09/2017   HGB 12.9 04/09/2017   HCT 37.3 04/09/2017   MCV 94.3 04/09/2017   PLT 254 04/09/2017   Lab Results  Component Value Date   LABCA2 20.6 02/22/2017     STUDIES: No results  found.  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  PLAN:    1. 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 She has also completed adjuvant XRT. No further intervention is needed. An aromatase inhibitor would not offer benefit given the triple negative status of her disease. Her most recent mammogram on May 16, 2017 was reported as BI-RADS 2.  Return to clinic in 6 months for further evaluation. 2. Anxiety: Improving. Monitor.  3. Anemia: Resolved. 4. Peripheral neuropathy: Lyrica was not covered by insurance, therefore patient could not afford this treatment. She was given a prescription for gabapentin 300 mg 3 times a day today. Patient was also instructed that she could take 1 tablet a morning and 2 tabs at night if her symptoms are worse during sleep.  5. Difficulty sleeping: Improved. Continue Ambien as needed.  Patient expressed understanding and was in agreement with this plan. She also understands that She can call clinic at any time with any questions, concerns, or complaints.   Breast cancer   Staging form: Breast, AJCC 7th Edition     Clinical stage from 06/04/2015: Stage IA (T1c, N0, M0) - Signed by Lloyd Huger, MD on 06/04/2015   Lloyd Huger, MD 08/28/17 11:47 AM

## 2017-08-23 ENCOUNTER — Inpatient Hospital Stay: Payer: BLUE CROSS/BLUE SHIELD | Attending: Oncology | Admitting: Oncology

## 2017-08-23 ENCOUNTER — Inpatient Hospital Stay: Payer: BLUE CROSS/BLUE SHIELD

## 2017-08-23 VITALS — BP 150/102 | HR 67 | Temp 97.5°F | Resp 18 | Wt 214.0 lb

## 2017-08-23 DIAGNOSIS — Z923 Personal history of irradiation: Secondary | ICD-10-CM

## 2017-08-23 DIAGNOSIS — M129 Arthropathy, unspecified: Secondary | ICD-10-CM

## 2017-08-23 DIAGNOSIS — Z803 Family history of malignant neoplasm of breast: Secondary | ICD-10-CM

## 2017-08-23 DIAGNOSIS — G4700 Insomnia, unspecified: Secondary | ICD-10-CM

## 2017-08-23 DIAGNOSIS — Z23 Encounter for immunization: Secondary | ICD-10-CM | POA: Diagnosis not present

## 2017-08-23 DIAGNOSIS — Z9012 Acquired absence of left breast and nipple: Secondary | ICD-10-CM | POA: Insufficient documentation

## 2017-08-23 DIAGNOSIS — G629 Polyneuropathy, unspecified: Secondary | ICD-10-CM | POA: Insufficient documentation

## 2017-08-23 DIAGNOSIS — Z79899 Other long term (current) drug therapy: Secondary | ICD-10-CM | POA: Diagnosis not present

## 2017-08-23 DIAGNOSIS — C50412 Malignant neoplasm of upper-outer quadrant of left female breast: Secondary | ICD-10-CM | POA: Diagnosis not present

## 2017-08-23 DIAGNOSIS — Z9221 Personal history of antineoplastic chemotherapy: Secondary | ICD-10-CM | POA: Diagnosis not present

## 2017-08-23 DIAGNOSIS — F419 Anxiety disorder, unspecified: Secondary | ICD-10-CM

## 2017-08-23 DIAGNOSIS — Z171 Estrogen receptor negative status [ER-]: Secondary | ICD-10-CM | POA: Diagnosis not present

## 2017-08-23 DIAGNOSIS — Z7982 Long term (current) use of aspirin: Secondary | ICD-10-CM

## 2017-08-23 DIAGNOSIS — I10 Essential (primary) hypertension: Secondary | ICD-10-CM

## 2017-08-23 MED ORDER — GABAPENTIN 300 MG PO CAPS: 300.0000 mg | ORAL_CAPSULE | Freq: Three times a day (TID) | ORAL | 2 refills | Status: DC

## 2017-08-23 MED ORDER — ZOLPIDEM TARTRATE 5 MG PO TABS: 5.0000 mg | ORAL_TABLET | Freq: Every evening | ORAL | 3 refills | Status: DC | PRN

## 2017-08-24 LAB — CANCER ANTIGEN 27.29: CAN 27.29: 18.8 U/mL (ref 0.0–38.6)

## 2017-08-30 ENCOUNTER — Inpatient Hospital Stay: Payer: BLUE CROSS/BLUE SHIELD

## 2017-08-30 DIAGNOSIS — Z23 Encounter for immunization: Secondary | ICD-10-CM

## 2017-08-30 DIAGNOSIS — C50412 Malignant neoplasm of upper-outer quadrant of left female breast: Secondary | ICD-10-CM | POA: Diagnosis not present

## 2017-08-30 MED ORDER — INFLUENZA VAC SPLIT QUAD 0.5 ML IM SUSY
0.5000 mL | PREFILLED_SYRINGE | Freq: Once | INTRAMUSCULAR | Status: AC
  Administered 2017-08-30: 0.5 mL via INTRAMUSCULAR

## 2017-10-21 ENCOUNTER — Other Ambulatory Visit: Payer: Self-pay

## 2017-10-21 ENCOUNTER — Ambulatory Visit
Admission: EM | Admit: 2017-10-21 | Discharge: 2017-10-21 | Disposition: A | Discharge status: Home / Self Care | Payer: BLUE CROSS/BLUE SHIELD | Attending: Family Medicine | Admitting: Family Medicine

## 2017-10-21 DIAGNOSIS — R3915 Urgency of urination: Secondary | ICD-10-CM | POA: Diagnosis not present

## 2017-10-21 DIAGNOSIS — R300 Dysuria: Secondary | ICD-10-CM | POA: Diagnosis not present

## 2017-10-21 DIAGNOSIS — R350 Frequency of micturition: Secondary | ICD-10-CM | POA: Diagnosis not present

## 2017-10-21 LAB — URINALYSIS, COMPLETE (UACMP) WITH MICROSCOPIC
BACTERIA UA: NONE SEEN
RBC / HPF: NONE SEEN RBC/hpf (ref 0–5)
Squamous Epithelial / LPF: NONE SEEN

## 2017-10-21 MED ORDER — SULFAMETHOXAZOLE-TRIMETHOPRIM 800-160 MG PO TABS: 1.0000 | ORAL_TABLET | Freq: Two times a day (BID) | ORAL | 0 refills | Status: AC

## 2017-10-21 NOTE — ED Triage Notes (Signed)
Patient complains of urinary urgency, burning with urination, bladder pressure, urinary frequency. Patient states that she had similar symptoms earlier this year and was diagnosed with a UTI. Patient states that symptoms started Friday night. Patient has been using AZO to help with pain.

## 2017-10-21 NOTE — Discharge Instructions (Signed)
Take medication as prescribed. Rest. Drink plenty of fluids.  ° °Follow up with your primary care physician this week as needed. Return to Urgent care for new or worsening concerns.  ° °

## 2017-10-21 NOTE — ED Provider Notes (Signed)
[facility] ____________________________________________  Time seen: Approximately 7:20 PM  I have reviewed the triage vital signs and the nursing notes.   HISTORY  Chief Complaint Urinary Frequency  HPI Brittany Ryan is a 56 y.o. female presents for evaluation of 3 days of urinary frequency, urinary urgency, suprapubic pressure, and some intermittent burning with urination.  States has tried intermittent over-the-counter Uristat since yesterday without resolution of symptoms.  Denies known trigger for current complaints. States also with some low back pain. Also reports bilateral leg aching, but states chronic bilateral lower leg pain that feels the same, denies injury or paresthesias.  Patient denies any vaginal discharge, vaginal discomfort or vaginal pain.  Denies known trigger.  States no recent sexual activity.  Denies unilateral flank pain, vomiting, nausea, fevers, abdominal pain otherwise.  Reports continues to eat and drink well.  States drinking plenty of water.  Denies recent sickness or recent antibiotic use.  Reports otherwise feels well. Denies chest pain, shortness of breath, abdominal pain, or rash. Denies recent sickness. Denies recent antibiotic use. Denies renal insufficiency.   Brittany Good, NP: PCP   Past Medical History:  Diagnosis Date  . Arthritis   . Breast cancer ([place]) 2016   LT LUMPECTOMY  . Frequent sinus infections   . History of chemotherapy 2016   BREAST CA  . Hypertension   . Neuropathy   . Radiation 2016-17   BREAST CA  . Seasonal allergies     Patient Active Problem List   Diagnosis Date Noted  . Seasonal allergies   . Hx of radiation therapy   . Hypertension   . Frequent sinus infections   . Primary cancer of upper outer quadrant of left female breast (Brittany Ryan)   . History of chemotherapy 12/03/2014    Past Surgical History:  Procedure Laterality Date  . BREAST BIOPSY Left 04/27/2015   POS  . BREAST LUMPECTOMY WITH NEEDLE  LOCALIZATION Left 05/19/2015   Performed by Brittany Glen, MD at [facility] ORS  . BREAST LUMPECTOMY WITH SENTINEL LYMPH NODE BX Left 05/19/2015   Performed by Brittany Glen, MD at [facility]  . INSERTION PORT-A-CATH Left 05/19/2015   Performed by Brittany Glen, MD at [facility] ORS  . MASTECTOMY PARTIAL Left 05/19/2015   Performed by Brittany Glen, MD at [facility] ORS  . REMOVAL PORT-A-CATH Left 04/16/2017   Performed by Brittany Glen, MD at [facility] ORS     No current facility-administered medications for this encounter.   Current Outpatient Medications:  .  acetaminophen (TYLENOL) 500 MG tablet, Take 1,000 mg by mouth every 6 (six) hours as needed for mild pain or headache., Disp: , Rfl:  .  aspirin 81 MG chewable tablet, Chew 81 mg by mouth daily., Disp: , Rfl:  .  B Complex Vitamins (VITAMIN B COMPLEX PO), Take 1 tablet by mouth daily as needed., Disp: , Rfl:  .  BIOTIN PO, Take 1 tablet by mouth daily., Disp: , Rfl:  .  CALCIUM PO, Take 1 tablet by mouth daily., Disp: , Rfl:  .  fluticasone (FLONASE) 50 MCG/ACT nasal spray, Place 1 spray into both nostrils as needed. , Disp: , Rfl:  .  ibuprofen (ADVIL,MOTRIN) 200 MG tablet, Take 400 mg by mouth every 8 (eight) hours as needed (for pain/headaches.). , Disp: , Rfl:  .  L-Arginine 1000 MG TABS, Take by mouth., Disp: , Rfl:  .  loratadine (CLARITIN) 10 MG tablet, Take 10 mg by mouth daily as  needed (for allergies.). , Disp: , Rfl:  .  polyethylene glycol powder (GLYCOLAX/MIRALAX) powder, Take 17 g by mouth daily as needed (for constipation). , Disp: , Rfl:  .  TURMERIC PO, Take 1 capsule by mouth daily., Disp: , Rfl:  .  zolpidem (AMBIEN) 5 MG tablet, Take 1 tablet (5 mg total) by mouth at bedtime as needed. for sleep, Disp: 30 tablet, Rfl: 3 .  sulfamethoxazole-trimethoprim (BACTRIM DS,SEPTRA DS) 800-160 MG tablet, Take 1 tablet 2 (two) times daily for 3 days by mouth., Disp: 6 tablet, Rfl: 0  Facility-Administered Medications Ordered in  Other Encounters:  .  heparin lock flush 100 unit/mL, 500 Units, Intravenous, Once, Finnegan, Kathlene November, MD .  heparin lock flush 100 unit/mL, 500 Units, Intravenous, Once, Finnegan, Kathlene November, MD .  heparin lock flush 100 unit/mL, 500 Units, Intracatheter, Once PRN, Lloyd Huger, MD .  sodium chloride 0.9 % injection 10 mL, 10 mL, Intracatheter, PRN, Lloyd Huger, MD, 10 mL at 07/22/15 0956 .  sodium chloride 0.9 % injection 10 mL, 10 mL, Intravenous, PRN, Lloyd Huger, MD, 10 mL at 09/02/15 0941 .  sodium chloride 0.9 % injection 10 mL, 10 mL, Intravenous, PRN, Grayland Ormond, Kathlene November, MD .  sodium chloride 0.9 % injection 10 mL, 10 mL, Intracatheter, PRN, Lloyd Huger, MD, 10 mL at 09/23/15 1205 .  sodium chloride 0.9 % injection 10 mL, 10 mL, Intracatheter, PRN, Lloyd Huger, MD, 10 mL at 09/30/15 0900  Allergies Patient has no known allergies.  Family History  Problem Relation Age of Onset  . Cancer Father   . Hypertension Father   . CAD Father   . Heart attack Father   . Breast cancer Cousin   . Breast cancer Sister 53    Social History Social History   Tobacco Use  . Smoking status: Never Smoker  . Smokeless tobacco: Never Used  Substance Use Topics  . Alcohol use: Yes    Comment: rare beer  . Drug use: No    Review of Systems Constitutional: No fever/chills Cardiovascular: Denies chest pain. Respiratory: Denies shortness of breath. Gastrointestinal: No abdominal pain.  No nausea, no vomiting.  No diarrhea.   Genitourinary: positive for dysuria. Musculoskeletal: Negative for back pain. Skin: Negative for rash.   ____________________________________________   PHYSICAL EXAM:  VITAL SIGNS: ED Triage Vitals  Enc Vitals Group     BP 10/21/17 1902 (!) 142/91     Pulse Rate 10/21/17 1902 80     Resp 10/21/17 1902 18     Temp 10/21/17 1902 98 F (36.7 C)     Temp Source 10/21/17 1902 Oral     SpO2 10/21/17 1902 96 %     Weight  10/21/17 1859 210 lb (95.3 kg)     Height 10/21/17 1859 5\' 9"  (1.753 m)     Head Circumference --      Peak Flow --      Pain Score 10/21/17 1859 6     Pain Loc --      Pain Edu? --      Excl. in [place]? --     Constitutional: Alert and oriented. Well appearing and in no acute distress. Cardiovascular: Normal rate, regular rhythm. Grossly normal heart sounds.  Ryan peripheral circulation. Respiratory: Normal respiratory effort without tachypnea nor retractions. Breath sounds are clear and equal bilaterally. No wheezes, rales, rhonchi. Gastrointestinal: Mild suprapubic discomfort, abdomen otherwise soft and nontender.No CVA tenderness. Musculoskeletal: Mild diffuse lower lumbar  tenderness to palpation, no point bony tenderness, back otherwise nontender.  Changes positions quickly in room.Steady gait.  Neurologic:  Normal speech and language. Speech is normal. No gait instability.  Skin:  Skin is warm, dry and intact. No rash noted. Psychiatric: Mood and affect are normal. Speech and behavior are normal. Patient exhibits appropriate insight and judgment   ___________________________________________   LABS (all labs ordered are listed, but only abnormal results are displayed)  Labs Reviewed  URINALYSIS, COMPLETE (UACMP) WITH MICROSCOPIC - Abnormal; Notable for the following components:      Result Value   Color, Urine ORANGE (*)    Glucose, UA   (*)    Value: TEST NOT REPORTED DUE TO COLOR INTERFERENCE OF URINE PIGMENT   Hgb urine dipstick   (*)    Value: TEST NOT REPORTED DUE TO COLOR INTERFERENCE OF URINE PIGMENT   Bilirubin Urine   (*)    Value: TEST NOT REPORTED DUE TO COLOR INTERFERENCE OF URINE PIGMENT   Ketones, ur   (*)    Value: TEST NOT REPORTED DUE TO COLOR INTERFERENCE OF URINE PIGMENT   Protein, ur   (*)    Value: TEST NOT REPORTED DUE TO COLOR INTERFERENCE OF URINE PIGMENT   Nitrite   (*)    Value: TEST NOT REPORTED DUE TO COLOR INTERFERENCE OF URINE PIGMENT    Leukocytes, UA   (*)    Value: TEST NOT REPORTED DUE TO COLOR INTERFERENCE OF URINE PIGMENT   All other components within normal limits  URINE CULTURE   ____________________________________________  PROCEDURES Procedures    INITIAL IMPRESSION / ASSESSMENT AND PLAN / ED COURSE  Pertinent labs & imaging results that were available during my care of the patient were reviewed by me and considered in my medical decision making (see chart for details).  Very well-appearing patient.  No acute distress.  Patient's objective report consistent with concerns of UTI.  Reviewed urine and discussed in detail with patient, not clear urinary tract infection.  Patient did take Azo today, interference of urine.  Discussed with patient will culture urine.  Start patient on 3-day course of Bactrim.  Encouraged rest, fluids, supportive care and continue to monitoring for triggers.  Discussed prompt reevaluation and follow-up for continued complaints or worsening complaints. Discussed indication, risks and benefits of medications with patient.  Discussed follow up with Primary care physician this week. Discussed follow up and return parameters including no resolution or any worsening concerns. Patient verbalized understanding and agreed to plan.   ____________________________________________   FINAL CLINICAL IMPRESSION(S) / ED DIAGNOSES  Final diagnoses:  Dysuria     ED Discharge Orders        Ordered    sulfamethoxazole-trimethoprim (BACTRIM DS,SEPTRA DS) 800-160 MG tablet  2 times daily     10/21/17 1934       Note: This dictation was prepared with Dragon dictation along with smaller phrase technology. Any transcriptional errors that result from this process are unintentional.         Marylene Land, NP 10/21/17 2013

## 2017-10-23 LAB — URINE CULTURE

## 2018-02-16 NOTE — Progress Notes (Deleted)
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  INTERVAL HISTORY: Patient returns to clinic today for routine 6 month evaluation. She continues to feel well and is asymptomatic. She continues to have a mild peripheral neuropathy but does not affect her day-to-day activity. She has no other neurologic complaints. She denies any recent fevers.  She has a Ryan appetite and denies weight loss. She denies any pain. She has no chest pain or shortness of breath. She denies any nausea, vomiting, constipation, or diarrhea. She has no urinary complaints. Patient offers no further specific complaints today.  REVIEW OF SYSTEMS:   Review of Systems  Constitutional: Negative.  Negative for fever, malaise/fatigue and weight loss.  Respiratory: Negative.  Negative for shortness of breath.   Cardiovascular: Negative.  Negative for chest pain and leg swelling.  Gastrointestinal: Negative.  Negative for abdominal pain.  Genitourinary: Negative.   Musculoskeletal: Negative.   Skin: Negative.  Negative for rash.  Neurological: Positive for sensory change. Negative for weakness.  Endo/Heme/Allergies: Does not bruise/bleed easily.  Psychiatric/Behavioral: The patient is not nervous/anxious and does not have insomnia.     As per HPI. Otherwise, a complete review of systems is negative.  PAST MEDICAL HISTORY: Past Medical History:  Diagnosis Date  . Arthritis   . Breast cancer ([place]) 2016   LT LUMPECTOMY  . Frequent sinus infections   . History of chemotherapy 2016   BREAST CA  . Hypertension   . Neuropathy   . Radiation  2016-17   BREAST CA  . Seasonal allergies     PAST SURGICAL HISTORY: Past Surgical History:  Procedure Laterality Date  . BREAST BIOPSY Left 04/27/2015   POS  . BREAST LUMPECTOMY WITH NEEDLE LOCALIZATION Left 05/19/2015   Procedure: BREAST LUMPECTOMY WITH NEEDLE LOCALIZATION;  Surgeon: Brittany Glen, MD;  Location: [facility];  Service: General;  Laterality: Left;  . BREAST LUMPECTOMY WITH SENTINEL LYMPH NODE BIOPSY Left 05/19/2015   Procedure: BREAST LUMPECTOMY WITH SENTINEL LYMPH NODE BX;  Surgeon: Brittany Glen, MD;  Location: [facility];  Service: General;  Laterality: Left;  Marland Kitchen MASTECTOMY, PARTIAL Left 05/19/2015   Procedure: MASTECTOMY PARTIAL;  Surgeon: Brittany Glen, MD;  Location: [facility];  Service: General;  Laterality: Left;  . PORT-A-CATH REMOVAL Left 04/16/2017   Procedure: REMOVAL PORT-A-CATH;  Surgeon: Brittany Glen, MD;  Location: [facility];  Service: General;  Laterality: Left;  . PORTACATH PLACEMENT Left 05/19/2015   Procedure: INSERTION PORT-A-CATH;  Surgeon: Brittany Glen, MD;  Location: [facility];  Service: General;  Laterality: Left;    FAMILY HISTORY Family History  Problem Relation Age of Onset  . Cancer Father   . Hypertension Father   . CAD Father   . Heart attack Father   . Breast cancer Cousin   . Breast cancer Sister 29       ADVANCED DIRECTIVES:    HEALTH MAINTENANCE: Social History   Tobacco Use  . Smoking status: Never Smoker  . Smokeless tobacco: Never Used  Substance Use Topics  . Alcohol use: Yes    Comment: rare beer  . Drug use: No     Colonoscopy:  PAP:  Bone density:  Lipid panel:  No Known Allergies  Current Outpatient Medications  Medication Sig Dispense Refill  . acetaminophen (TYLENOL) 500 MG tablet Take 1,000 mg by mouth every 6 (six) hours as needed for mild pain or headache.    Marland Kitchen aspirin 81 MG chewable tablet Chew 81 mg by mouth daily.    . B Complex Vitamins (VITAMIN B COMPLEX PO) Take 1 tablet by mouth  daily as needed.    Marland Kitchen BIOTIN PO Take 1 tablet by mouth daily.    Marland Kitchen CALCIUM PO Take 1 tablet by mouth daily.    . fluticasone (FLONASE) 50 MCG/ACT nasal spray Place 1 spray into both nostrils as needed.     Marland Kitchen ibuprofen (ADVIL,MOTRIN) 200 MG tablet Take 400 mg by mouth every 8 (eight) hours as needed (for pain/headaches.).     Marland Kitchen L-Arginine 1000 MG TABS Take by mouth.    . loratadine (CLARITIN) 10 MG tablet Take 10 mg by mouth daily as needed (for allergies.).     Marland Kitchen polyethylene glycol powder (GLYCOLAX/MIRALAX) powder Take 17 g by mouth daily as needed (for constipation).     . TURMERIC PO Take 1 capsule by mouth daily.    Marland Kitchen zolpidem (AMBIEN) 5 MG tablet Take 1 tablet (5 mg total) by mouth at bedtime as needed. for sleep 30 tablet 3   No current facility-administered medications for this visit.    Facility-Administered Medications Ordered in Other Visits  Medication Dose Route Frequency Provider Last Rate Last Dose  . heparin lock flush 100 unit/mL  500 Units Intravenous Once Lloyd Huger, MD      . heparin lock flush 100 unit/mL  500 Units Intravenous Once Lloyd Huger, MD      . heparin lock flush 100 unit/mL  500 Units Intracatheter Once PRN Lloyd Huger, MD      . sodium chloride 0.9 % injection 10 mL  10 mL Intracatheter PRN Lloyd Huger, MD   10 mL at 07/22/15 0956  . sodium chloride 0.9 % injection 10 mL  10 mL Intravenous PRN Lloyd Huger, MD   10 mL at 09/02/15 0941  . sodium chloride 0.9 % injection 10 mL  10 mL Intravenous PRN Lloyd Huger, MD      . sodium chloride 0.9 % injection 10 mL  10 mL Intracatheter PRN Lloyd Huger, MD   10 mL at 09/23/15 1205  . sodium chloride 0.9 % injection 10 mL  10 mL Intracatheter PRN Lloyd Huger, MD   10 mL at 09/30/15 0900    OBJECTIVE: There were no vitals filed for this visit.   There is no height or weight on file to calculate BMI.    ECOG FS:0 - Asymptomatic  General: Well-developed,  well-nourished, no acute distress. Eyes: anicteric sclera. Breasts: Bilateral breast and axilla without lumps or masses. Patient requested exam be deferred today. Lungs: Clear to auscultation bilaterally. Heart: Regular rate and rhythm. No rubs, murmurs, or gallops. Abdomen: Soft, nontender, nondistended. No organomegaly noted, normoactive bowel sounds. Musculoskeletal: No edema, cyanosis, or clubbing. Neuro: Alert, answering all questions appropriately. Cranial nerves grossly intact. Skin: No rashes or petechiae noted. Psych: Normal affect.   LAB RESULTS:  Lab Results  Component Value Date   NA 140 04/09/2017   K 4.1 04/09/2017   CL 103 04/09/2017   CO2 30 04/09/2017   GLUCOSE 98 04/09/2017   BUN 16 04/09/2017   CREATININE 0.63 04/09/2017  CALCIUM 9.4 04/09/2017   PROT 6.9 01/11/2016   ALBUMIN 3.9 01/11/2016   AST 21 01/11/2016   ALT 22 01/11/2016   ALKPHOS 65 01/11/2016   BILITOT 0.6 01/11/2016   GFRNONAA >60 04/09/2017   GFRAA >60 04/09/2017    Lab Results  Component Value Date   WBC 5.3 04/09/2017   NEUTROABS 3.1 04/09/2017   HGB 12.9 04/09/2017   HCT 37.3 04/09/2017   MCV 94.3 04/09/2017   PLT 254 04/09/2017   Lab Results  Component Value Date   LABCA2 20.6 02/22/2017     STUDIES: No results found.  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  PLAN:    1. 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 She has also completed adjuvant XRT. No further intervention is needed. An aromatase inhibitor would not offer benefit given the triple negative status of her disease. Her most recent mammogram on May 16, 2017 was reported as BI-RADS 2.  Return to clinic in 6 months for further evaluation. 2. Anxiety: Improving. Monitor.  3. Anemia:  Resolved. 4. Peripheral neuropathy: Lyrica was not covered by insurance, therefore patient could not afford this treatment. She was given a prescription for gabapentin 300 mg 3 times a day today. Patient was also instructed that she could take 1 tablet a morning and 2 tabs at night if her symptoms are worse during sleep.  5. Difficulty sleeping: Improved. Continue Ambien as needed.  Patient expressed understanding and was in agreement with this plan. She also understands that She can call clinic at any time with any questions, concerns, or complaints.   Breast cancer   Staging form: Breast, AJCC 7th Edition     Clinical stage from 06/04/2015: Stage IA (T1c, N0, M0) - Signed by Lloyd Huger, MD on 06/04/2015   Lloyd Huger, MD 02/16/18 10:44 AM

## 2018-02-21 ENCOUNTER — Ambulatory Visit: Payer: BLUE CROSS/BLUE SHIELD | Admitting: Oncology

## 2018-02-23 NOTE — Progress Notes (Signed)
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  INTERVAL HISTORY: Patient returns to clinic today for routine 6 month evaluation. She has noticed a tender "lump" in her right outer breast/axilla over the past several weeks.  She is also concerned about the area surrounding her surgical scar on her left breast.  She otherwise feels well and is asymptomatic. She does not complain of peripheral neuropathy today. She has no other neurologic complaints. She denies any recent fevers.  She has a good appetite and denies weight loss. She denies any pain. She has no chest pain or shortness of breath. She denies any nausea, vomiting, constipation, or diarrhea. She has no urinary complaints. Patient offers no further specific complaints today.  REVIEW OF SYSTEMS:   Review of Systems  Constitutional: Negative.  Negative for fever, malaise/fatigue and weight loss.  Respiratory: Negative.  Negative for shortness of breath.   Cardiovascular: Negative.  Negative for chest pain and leg swelling.  Gastrointestinal: Negative.  Negative for abdominal pain, blood in stool and melena.  Genitourinary: Negative.   Musculoskeletal: Negative.   Skin: Negative.  Negative for rash.  Neurological: Negative.  Negative for sensory change, focal weakness and weakness.  Endo/Heme/Allergies: Does not bruise/bleed easily.  Psychiatric/Behavioral: The patient is not nervous/anxious and does not have insomnia.     As per HPI. Otherwise, a complete review of systems is negative.  PAST MEDICAL HISTORY: Past Medical History:  Diagnosis Date  .  Arthritis   . Breast cancer ([place]) 2016   LT LUMPECTOMY  . Frequent sinus infections   . History of chemotherapy 2016   BREAST CA  . Hypertension   . Neuropathy   . Radiation 2016-17   BREAST CA  . Seasonal allergies     PAST SURGICAL HISTORY: Past Surgical History:  Procedure Laterality Date  . BREAST BIOPSY Left 04/27/2015   POS  . BREAST LUMPECTOMY WITH NEEDLE LOCALIZATION Left 05/19/2015   Procedure: BREAST LUMPECTOMY WITH NEEDLE LOCALIZATION;  Surgeon: Florene Glen, MD;  Location: [facility];  Service: General;  Laterality: Left;  . BREAST LUMPECTOMY WITH SENTINEL LYMPH NODE BIOPSY Left 05/19/2015   Procedure: BREAST LUMPECTOMY WITH SENTINEL LYMPH NODE BX;  Surgeon: Florene Glen, MD;  Location: [facility];  Service: General;  Laterality: Left;  Marland Kitchen MASTECTOMY, PARTIAL Left 05/19/2015   Procedure: MASTECTOMY PARTIAL;  Surgeon: Florene Glen, MD;  Location: [facility];  Service: General;  Laterality: Left;  . PORT-A-CATH REMOVAL Left 04/16/2017   Procedure: REMOVAL PORT-A-CATH;  Surgeon: Florene Glen, MD;  Location: [facility];  Service: General;  Laterality: Left;  . PORTACATH PLACEMENT Left 05/19/2015   Procedure: INSERTION PORT-A-CATH;  Surgeon: Florene Glen, MD;  Location: [facility];  Service: General;  Laterality: Left;    FAMILY HISTORY Family History  Problem Relation Age of Onset  . Cancer Father   . Hypertension Father   . CAD Father   . Heart attack Father   . Breast cancer Cousin   . Breast cancer Sister 69       ADVANCED DIRECTIVES:    HEALTH MAINTENANCE: Social History   Tobacco Use  . Smoking status: Never  Smoker  . Smokeless tobacco: Never Used  Substance Use Topics  . Alcohol use: Yes    Comment: rare beer  . Drug use: No     Colonoscopy:  PAP:  Bone density:  Lipid panel:  No Known Allergies  Current Outpatient Medications  Medication Sig Dispense Refill  . acetaminophen (TYLENOL) 500 MG tablet Take 1,000 mg by mouth every 6  (six) hours as needed for mild pain or headache.    . B Complex Vitamins (VITAMIN B COMPLEX PO) Take 1 tablet by mouth daily as needed.    Marland Kitchen BIOTIN PO Take 1 tablet by mouth daily.    Marland Kitchen CALCIUM PO Take 1 tablet by mouth daily.    Marland Kitchen gabapentin (NEURONTIN) 300 MG capsule Take 300 mg by mouth 3 (three) times daily.    . Garlic 829 MG TABS Take by mouth.    Marland Kitchen ibuprofen (ADVIL,MOTRIN) 200 MG tablet Take 400 mg by mouth every 8 (eight) hours as needed (for pain/headaches.).     Marland Kitchen L-Arginine 1000 MG TABS Take by mouth.    . loratadine (CLARITIN) 10 MG tablet Take 10 mg by mouth daily as needed (for allergies.).     Marland Kitchen polyethylene glycol powder (GLYCOLAX/MIRALAX) powder Take 17 g by mouth daily as needed (for constipation).     . TURMERIC PO Take 1 capsule by mouth daily.    Marland Kitchen zolpidem (AMBIEN) 5 MG tablet Take 1 tablet (5 mg total) by mouth at bedtime as needed. for sleep 30 tablet 3  . aspirin 81 MG chewable tablet Chew 81 mg by mouth daily.    . fluticasone (FLONASE) 50 MCG/ACT nasal spray Place 1 spray into both nostrils as needed.      No current facility-administered medications for this visit.    Facility-Administered Medications Ordered in Other Visits  Medication Dose Route Frequency Provider Last Rate Last Dose  . heparin lock flush 100 unit/mL  500 Units Intravenous Once Lloyd Huger, MD      . heparin lock flush 100 unit/mL  500 Units Intravenous Once Lloyd Huger, MD      . heparin lock flush 100 unit/mL  500 Units Intracatheter Once PRN Lloyd Huger, MD      . sodium chloride 0.9 % injection 10 mL  10 mL Intracatheter PRN Lloyd Huger, MD   10 mL at 07/22/15 0956  . sodium chloride 0.9 % injection 10 mL  10 mL Intravenous PRN Lloyd Huger, MD   10 mL at 09/02/15 0941  . sodium chloride 0.9 % injection 10 mL  10 mL Intravenous PRN Lloyd Huger, MD      . sodium chloride 0.9 % injection 10 mL  10 mL Intracatheter PRN Lloyd Huger, MD   10  mL at 09/23/15 1205  . sodium chloride 0.9 % injection 10 mL  10 mL Intracatheter PRN Lloyd Huger, MD   10 mL at 09/30/15 0900    OBJECTIVE: Vitals:   02/28/18 0851  BP: 132/82  Pulse: 81  Resp: 20  Temp: (!) 97.3 F (36.3 C)     Body mass index is 32 kg/m.    ECOG FS:0 - Asymptomatic  General: Well-developed, well-nourished, no acute distress. Eyes: anicteric sclera. Breasts: Minimally palpable nodule in the right axilla.  Otherwise bilateral breasts without lumps or masses.  Lungs: Clear to auscultation bilaterally. Heart: Regular rate and rhythm. No rubs, murmurs, or gallops. Abdomen: Soft, nontender, nondistended. No organomegaly noted, normoactive bowel  sounds. Musculoskeletal: No edema, cyanosis, or clubbing. Neuro: Alert, answering all questions appropriately. Cranial nerves grossly intact. Skin: No rashes or petechiae noted. Psych: Normal affect.   LAB RESULTS:  Lab Results  Component Value Date   NA 140 04/09/2017   K 4.1 04/09/2017   CL 103 04/09/2017   CO2 30 04/09/2017   GLUCOSE 98 04/09/2017   BUN 16 04/09/2017   CREATININE 0.63 04/09/2017   CALCIUM 9.4 04/09/2017   PROT 6.9 01/11/2016   ALBUMIN 3.9 01/11/2016   AST 21 01/11/2016   ALT 22 01/11/2016   ALKPHOS 65 01/11/2016   BILITOT 0.6 01/11/2016   GFRNONAA >60 04/09/2017   GFRAA >60 04/09/2017    Lab Results  Component Value Date   WBC 5.3 04/09/2017   NEUTROABS 3.1 04/09/2017   HGB 12.9 04/09/2017   HCT 37.3 04/09/2017   MCV 94.3 04/09/2017   PLT 254 04/09/2017   Lab Results  Component Value Date   LABCA2 20.6 02/22/2017     STUDIES: No results found.  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  PLAN:    1. 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  She then completed adjuvant XRT. No further intervention is needed. An aromatase inhibitor would not offer benefit given the triple negative status of her disease. Her most recent mammogram on May 16, 2017 was reported as BI-RADS 2.  Because of the new onset tenderness, will get a repeat mammogram in the next 1-2 weeks.  If negative, patient will return to clinic in 6 months for routine evaluation.  If there are any concerns, she will return to clinic 1-2 days after her mammogram.   2. Anxiety: Monitor.  3. Anemia: Resolved. 4. Peripheral neuropathy: Lyrica was not covered by insurance, therefore patient could not afford this treatment.  Continue gabapentin 300 mg 3 times a day today.  5. Difficulty sleeping: Improved. Continue Ambien as needed.  Patient expressed understanding and was in agreement with this plan. She also understands that She can call clinic at any time with any questions, concerns, or complaints.   Breast cancer   Staging form: Breast, AJCC 7th Edition     Clinical stage from 06/04/2015: Stage IA (T1c, N0, M0) - Signed by Lloyd Huger, MD on 06/04/2015   Lloyd Huger, MD 02/28/18 8:59 AM

## 2018-02-28 ENCOUNTER — Encounter: Payer: Self-pay | Admitting: Oncology

## 2018-02-28 ENCOUNTER — Other Ambulatory Visit: Payer: Self-pay | Admitting: *Deleted

## 2018-02-28 ENCOUNTER — Inpatient Hospital Stay: Payer: BLUE CROSS/BLUE SHIELD | Attending: Oncology | Admitting: Oncology

## 2018-02-28 VITALS — BP 132/82 | HR 81 | Temp 97.3°F | Resp 20 | Wt 216.7 lb

## 2018-02-28 DIAGNOSIS — G4700 Insomnia, unspecified: Secondary | ICD-10-CM

## 2018-02-28 DIAGNOSIS — C50412 Malignant neoplasm of upper-outer quadrant of left female breast: Secondary | ICD-10-CM | POA: Diagnosis not present

## 2018-02-28 DIAGNOSIS — Z79899 Other long term (current) drug therapy: Secondary | ICD-10-CM | POA: Diagnosis not present

## 2018-02-28 DIAGNOSIS — Z7982 Long term (current) use of aspirin: Secondary | ICD-10-CM | POA: Diagnosis not present

## 2018-02-28 DIAGNOSIS — Z171 Estrogen receptor negative status [ER-]: Secondary | ICD-10-CM | POA: Diagnosis not present

## 2018-02-28 DIAGNOSIS — F419 Anxiety disorder, unspecified: Secondary | ICD-10-CM

## 2018-02-28 DIAGNOSIS — G629 Polyneuropathy, unspecified: Secondary | ICD-10-CM | POA: Diagnosis not present

## 2018-02-28 DIAGNOSIS — Z9221 Personal history of antineoplastic chemotherapy: Secondary | ICD-10-CM

## 2018-02-28 MED ORDER — ZOLPIDEM TARTRATE 5 MG PO TABS: 5.0000 mg | ORAL_TABLET | Freq: Every evening | ORAL | 3 refills | Status: DC | PRN

## 2018-02-28 NOTE — Progress Notes (Signed)
Patient here today for follow up regarding breast cancer. Patient reports she has a tender lump that she has felt under right arm/breast. She also reports areas she is concerned about in left breast.

## 2018-03-12 ENCOUNTER — Ambulatory Visit
Admission: RE | Admit: 2018-03-12 | Discharge: 2018-03-12 | Disposition: A | Discharge status: Home / Self Care | Payer: BLUE CROSS/BLUE SHIELD | Source: Ambulatory Visit | Attending: Oncology | Admitting: Oncology

## 2018-03-12 DIAGNOSIS — Z08 Encounter for follow-up examination after completed treatment for malignant neoplasm: Secondary | ICD-10-CM | POA: Insufficient documentation

## 2018-03-12 DIAGNOSIS — C50412 Malignant neoplasm of upper-outer quadrant of left female breast: Secondary | ICD-10-CM | POA: Diagnosis present

## 2018-03-12 DIAGNOSIS — Z853 Personal history of malignant neoplasm of breast: Secondary | ICD-10-CM | POA: Diagnosis not present

## 2018-07-17 ENCOUNTER — Encounter: Payer: Self-pay | Admitting: Emergency Medicine

## 2018-07-17 ENCOUNTER — Other Ambulatory Visit: Payer: Self-pay

## 2018-07-17 ENCOUNTER — Ambulatory Visit
Admission: EM | Admit: 2018-07-17 | Discharge: 2018-07-17 | Disposition: A | Discharge status: Home / Self Care | Payer: BLUE CROSS/BLUE SHIELD | Attending: Family Medicine | Admitting: Family Medicine

## 2018-07-17 DIAGNOSIS — K1370 Unspecified lesions of oral mucosa: Secondary | ICD-10-CM | POA: Diagnosis not present

## 2018-07-17 MED ORDER — TRIAMCINOLONE ACETONIDE 0.1 % MT PSTE: 1.0000 "application " | PASTE | Freq: Two times a day (BID) | OROMUCOSAL | 12 refills | Status: DC

## 2018-07-17 NOTE — ED Triage Notes (Signed)
Patient reports some swelling in her upper lip that started last Saturday.  Patient reports that she now has lesions on the inside of her upper and bottom lip for the past 2 days.

## 2018-07-17 NOTE — Discharge Instructions (Addendum)
Use Aleve to help control pain and swelling.  Use cool compresses on your mouth as necessary for comfort.  Apply Kenalog in [place] to lesions twice daily.  Follow-up with your dentist on Monday

## 2018-07-17 NOTE — ED Provider Notes (Addendum)
[facility]    CSN: [id number] Arrival date & time: 07/17/18  1849     History   Chief Complaint Chief Complaint  Patient presents with  . Mouth Lesions    HPI Brittany Ryan is a 57 y.o. female.   HPI  57 year old female presents with mouth lesions over the buccal surface of her lips and solitary ulceration over the vermilion border of her upper lip.  She has some swelling mostly on the right but is also noticeable on the left.  The swelling started last Saturday lesions on her buccal surface has been for the last 2 days.  She has not changed toothpaste has started using biotin mouthwash yesterday but this was after the lesions had already been established.  The only changes last week she started using a teeth whitening tray from her dentist prior to the onset of the lesions.  Now she has noticed that any acidic food causes her to have pain.       Past Medical History:  Diagnosis Date  . Arthritis   . Breast cancer ([place]) 2016   LT LUMPECTOMY  . Frequent sinus infections   . History of chemotherapy 2016   BREAST CA  . Hypertension   . Neuropathy   . Radiation 2016-17   BREAST CA  . Seasonal allergies     Patient Active Problem List   Diagnosis Date Noted  . Seasonal allergies   . Hx of radiation therapy   . Hypertension   . Frequent sinus infections   . Primary cancer of upper outer quadrant of left female breast ([place])   . History of chemotherapy 12/03/2014    Past Surgical History:  Procedure Laterality Date  . BREAST BIOPSY Left 04/27/2015   POS  . BREAST LUMPECTOMY WITH NEEDLE LOCALIZATION Left 05/19/2015   Procedure: BREAST LUMPECTOMY WITH NEEDLE LOCALIZATION;  Surgeon: Florene Glen, MD;  Location: [facility];  Service: General;  Laterality: Left;  . BREAST LUMPECTOMY WITH SENTINEL LYMPH NODE BIOPSY Left 05/19/2015   Procedure: BREAST LUMPECTOMY WITH SENTINEL LYMPH NODE BX;  Surgeon: Florene Glen, MD;  Location: [facility];  Service: General;   Laterality: Left;  Marland Kitchen MASTECTOMY, PARTIAL Left 05/19/2015   Procedure: MASTECTOMY PARTIAL;  Surgeon: Florene Glen, MD;  Location: [facility];  Service: General;  Laterality: Left;  . PORT-A-CATH REMOVAL Left 04/16/2017   Procedure: REMOVAL PORT-A-CATH;  Surgeon: Florene Glen, MD;  Location: [facility];  Service: General;  Laterality: Left;  . PORTACATH PLACEMENT Left 05/19/2015   Procedure: INSERTION PORT-A-CATH;  Surgeon: Florene Glen, MD;  Location: [facility];  Service: General;  Laterality: Left;    OB History   None      Home Medications    Prior to Admission medications   Medication Sig Start Date End Date Taking? Authorizing Provider  fexofenadine (ALLEGRA) 180 MG tablet Take 180 mg by mouth daily.   Yes [provider]  zolpidem (AMBIEN) 5 MG tablet Take 1 tablet (5 mg total) by mouth at bedtime as needed. for sleep 02/28/18  Yes Lloyd Huger, MD  acetaminophen (TYLENOL) 500 MG tablet Take 1,000 mg by mouth every 6 (six) hours as needed for mild pain or headache.    [provider]  B Complex Vitamins (VITAMIN B COMPLEX PO) Take 1 tablet by mouth daily as needed.    [provider]  ibuprofen (ADVIL,MOTRIN) 200 MG tablet Take 400 mg by mouth every 8 (eight) hours as needed (for  pain/headaches.).     [provider]  triamcinolone (KENALOG) 0.1 % paste Use as directed 1 application in the mouth or throat 2 (two) times daily. 07/17/18   Lorin Picket, PA-C    Family History Family History  Problem Relation Age of Onset  . Cancer Father   . Hypertension Father   . CAD Father   . Heart attack Father   . Breast cancer Sister 90  . Breast cancer Paternal Aunt     Social History Social History   Tobacco Use  . Smoking status: Never Smoker  . Smokeless tobacco: Never Used  Substance Use Topics  . Alcohol use: Yes    Comment: rare beer  . Drug use: No     Allergies   Patient has no known allergies.   Review of  Systems Review of Systems  Constitutional: Positive for appetite change. Negative for chills, fatigue and fever.  HENT: Positive for mouth sores.      Physical Exam Triage Vital Signs ED Triage Vitals  Enc Vitals Group     BP 07/17/18 1904 (!) 142/92     Pulse Rate 07/17/18 1904 70     Resp 07/17/18 1904 16     Temp 07/17/18 1904 98.2 F (36.8 C)     Temp Source 07/17/18 1904 Oral     SpO2 07/17/18 1904 98 %     Weight 07/17/18 1900 210 lb (95.3 kg)     Height 07/17/18 1900 5\' 9"  (1.753 m)     Head Circumference --      Peak Flow --      Pain Score 07/17/18 1900 6     Pain Loc --      Pain Edu? --      Excl. in [place]? --    No data found.  Updated Vital Signs BP (!) 142/92 (BP Location: Right Arm)   Pulse 70   Temp 98.2 F (36.8 C) (Oral)   Resp 16   Ht 5\' 9"  (1.753 m)   Wt 210 lb (95.3 kg)   SpO2 98%   BMI 31.01 kg/m   Visual Acuity Right Eye Distance:   Left Eye Distance:   Bilateral Distance:    Right Eye Near:   Left Eye Near:    Bilateral Near:     Physical Exam  Constitutional: She is oriented to person, place, and time. She appears well-developed and well-nourished. No distress.  HENT:  Head: Normocephalic.  Exam of the mouth shows external swelling over the upper and lower lips more prominent on the right than on the left.  The buccal surfaces shows all punctate on erythematous base lesions on the right upper lip buccal surface and resolving type ulcers on the lower buccal surface.  Remainder  of the buccal surface appears uninvolved.  Refer to photographs for detail  Eyes: Pupils are equal, round, and reactive to light. Right eye exhibits no discharge. Left eye exhibits no discharge.  Neck: Normal range of motion.  Musculoskeletal: Normal range of motion.  Lymphadenopathy:    She has no cervical adenopathy.  Neurological: She is alert and oriented to person, place, and time.  Skin: Skin is warm and dry. She is not diaphoretic.  Psychiatric: She has  a normal mood and affect. Her behavior is normal. Judgment and thought content normal.  Vitals reviewed.        UC Treatments / Results  Labs (all labs ordered are listed, but only abnormal results are displayed) Labs  Reviewed - No data to display  EKG None  Radiology No results found.  Procedures Procedures (including critical care time)  Medications Ordered in UC Medications - No data to display  Initial Impression / Assessment and Plan / UC Course  I have reviewed the triage vital signs and the nursing notes.  Pertinent labs & imaging results that were available during my care of the patient were reviewed by me and considered in my medical decision making (see chart for details).     Plan: 1. Test/x-ray results and diagnosis reviewed with patient 2. rx as per orders; risks, benefits, potential side effects reviewed with patient 3. Recommend supportive treatment with eating bland soft food until the ulcer is resolved.  May use cold compresses for comfort and swelling.  Use Kenalog and Orabase twice daily.  Refrain from the use of the whitening trays.  Recommend following up with your dentist on Monday 4. F/u prn if symptoms worsen or don't improve  Final Clinical Impressions(s) / UC Diagnoses   Final diagnoses:  Mouth lesion     Discharge Instructions     Use Aleve to help control pain and swelling.  Use cool compresses on your mouth as necessary for comfort.  Apply Kenalog in Orabase to lesions twice daily.  Follow-up with your dentist on Monday    ED Prescriptions    Medication Sig Dispense Auth. Provider   triamcinolone (KENALOG) 0.1 % paste Use as directed 1 application in the mouth or throat 2 (two) times daily. 5 g Lorin Picket, PA-C     Controlled Substance Prescriptions [place] Controlled Substance Registry consulted? Not Applicable   Lorin Picket, PA-C 07/17/18 1947    Lorin Picket, PA-C 07/17/18 1948

## 2018-08-24 NOTE — Progress Notes (Signed)
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  INTERVAL HISTORY: Patient returns to clinic today for routine six-month follow-up.  She currently feels well and is asymptomatic.  She has no neurologic complaints and does not complain of peripheral neuropathy. She denies any recent fevers.  She has a good appetite and denies weight loss. She denies any pain. She has no chest pain or shortness of breath. She denies any nausea, vomiting, constipation, or diarrhea. She has no urinary complaints.  Patient feels at her baseline offers no specific complaints today.  REVIEW OF SYSTEMS:   Review of Systems  Constitutional: Negative.  Negative for fever, malaise/fatigue and weight loss.  Respiratory: Negative.  Negative for shortness of breath.   Cardiovascular: Negative.  Negative for chest pain and leg swelling.  Gastrointestinal: Negative.  Negative for abdominal pain, blood in stool and melena.  Genitourinary: Negative.  Negative for dysuria.  Musculoskeletal: Negative.  Negative for back pain.  Skin: Negative.  Negative for rash.  Neurological: Negative.  Negative for sensory change, focal weakness, weakness and headaches.  Endo/Heme/Allergies: Does not bruise/bleed easily.  Psychiatric/Behavioral: The patient has insomnia. The patient is not nervous/anxious.     As per HPI. Otherwise, a complete review of systems is negative.  PAST MEDICAL HISTORY: Past Medical History:  Diagnosis Date  . Arthritis   . Breast cancer ([place]) 2016   LT LUMPECTOMY  . Frequent sinus infections   . History of chemotherapy 2016   BREAST CA  . Hypertension   . Neuropathy   . Radiation 2016-17   BREAST CA  . Seasonal allergies     PAST SURGICAL HISTORY: Past Surgical History:  Procedure Laterality Date  . BREAST BIOPSY Left 04/27/2015   POS  . BREAST LUMPECTOMY WITH NEEDLE LOCALIZATION Left 05/19/2015   Procedure: BREAST LUMPECTOMY WITH NEEDLE LOCALIZATION;  Surgeon: Florene Glen, MD;  Location: [facility];  Service: General;  Laterality: Left;  . BREAST LUMPECTOMY WITH SENTINEL LYMPH NODE BIOPSY Left 05/19/2015   Procedure: BREAST LUMPECTOMY WITH SENTINEL LYMPH NODE BX;  Surgeon: Florene Glen, MD;  Location: [facility];  Service: General;  Laterality: Left;  Marland Kitchen MASTECTOMY, PARTIAL Left 05/19/2015   Procedure: MASTECTOMY PARTIAL;  Surgeon: Florene Glen, MD;  Location: [facility];  Service: General;  Laterality: Left;  . PORT-A-CATH REMOVAL Left 04/16/2017   Procedure: REMOVAL PORT-A-CATH;  Surgeon: Florene Glen, MD;  Location: [facility];  Service: General;  Laterality: Left;  . PORTACATH PLACEMENT Left 05/19/2015   Procedure: INSERTION PORT-A-CATH;  Surgeon: Florene Glen, MD;  Location: [facility];  Service: General;  Laterality: Left;    FAMILY HISTORY Family History  Problem Relation Age of Onset  . Cancer Father   . Hypertension Father   . CAD Father   . Heart attack Father   . Breast cancer Sister 31  . Breast cancer Paternal Aunt        ADVANCED DIRECTIVES:    HEALTH MAINTENANCE: Social History   Tobacco Use  . Smoking status: Never Smoker  . Smokeless tobacco: Never Used  Substance Use Topics  . Alcohol use: Yes    Comment: rare beer  .  Drug use: No     Colonoscopy:  PAP:  Bone density:  Lipid panel:  No Known Allergies  Current Outpatient Medications  Medication Sig Dispense Refill  . acetaminophen (TYLENOL) 500 MG tablet Take 1,000 mg by mouth every 6 (six) hours as needed for mild pain or headache.    . ibuprofen (ADVIL,MOTRIN) 200 MG tablet Take 400 mg by mouth every  8 (eight) hours as needed (for pain/headaches.).     Marland Kitchen zolpidem (AMBIEN) 5 MG tablet Take 1 tablet (5 mg total) by mouth at bedtime as needed. for sleep 30 tablet 3   No current facility-administered medications for this visit.    Facility-Administered Medications Ordered in Other Visits  Medication Dose Route Frequency Provider Last Rate Last Dose  . heparin lock flush 100 unit/mL  500 Units Intravenous Once Lloyd Huger, MD      . heparin lock flush 100 unit/mL  500 Units Intravenous Once Lloyd Huger, MD      . heparin lock flush 100 unit/mL  500 Units Intracatheter Once PRN Lloyd Huger, MD      . sodium chloride 0.9 % injection 10 mL  10 mL Intracatheter PRN Lloyd Huger, MD   10 mL at 07/22/15 0956  . sodium chloride 0.9 % injection 10 mL  10 mL Intravenous PRN Lloyd Huger, MD   10 mL at 09/02/15 0941  . sodium chloride 0.9 % injection 10 mL  10 mL Intravenous PRN Lloyd Huger, MD      . sodium chloride 0.9 % injection 10 mL  10 mL Intracatheter PRN Lloyd Huger, MD   10 mL at 09/23/15 1205  . sodium chloride 0.9 % injection 10 mL  10 mL Intracatheter PRN Lloyd Huger, MD   10 mL at 09/30/15 0900    OBJECTIVE: Vitals:   08/29/18 0905  BP: 130/67  Pulse: 70  Resp: 18  Temp: 98 F (36.7 C)     Body mass index is 33.04 kg/m.    ECOG FS:0 - Asymptomatic  General: Well-developed, well-nourished, no acute distress. Eyes: Pink conjunctiva, anicteric sclera. HEENT: Normocephalic, moist mucous membranes. Breast: Patient reports a recent normal breast exam by another provider. Lungs: Clear to auscultation bilaterally. Heart: Regular rate and rhythm. No rubs, murmurs, or gallops. Abdomen: Soft, nontender, nondistended. No organomegaly noted, normoactive bowel sounds. Musculoskeletal: No edema, cyanosis, or clubbing. Neuro: Alert, answering all questions appropriately. Cranial nerves grossly intact. Skin: No rashes or petechiae  noted. Psych: Normal affect.   LAB RESULTS:  Lab Results  Component Value Date   NA 140 04/09/2017   K 4.1 04/09/2017   CL 103 04/09/2017   CO2 30 04/09/2017   GLUCOSE 98 04/09/2017   BUN 16 04/09/2017   CREATININE 0.63 04/09/2017   CALCIUM 9.4 04/09/2017   PROT 6.9 01/11/2016   ALBUMIN 3.9 01/11/2016   AST 21 01/11/2016   ALT 22 01/11/2016   ALKPHOS 65 01/11/2016   BILITOT 0.6 01/11/2016   GFRNONAA >60 04/09/2017   GFRAA >60 04/09/2017    Lab Results  Component Value Date   WBC 5.3 04/09/2017   NEUTROABS 3.1 04/09/2017   HGB 12.9 04/09/2017   HCT 37.3 04/09/2017   MCV 94.3 04/09/2017   PLT 254 04/09/2017   Lab Results  Component Value Date   LABCA2 20.6 02/22/2017     STUDIES: No results found.  ASSESSMENT: Pathologic stage Ia triple negative adenocarcinoma of the upper outer quadrant of the left  breast, BRCA 1 and 2 negative.  PLAN:    1. 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 She then completed adjuvant XRT. An aromatase inhibitor would not offer benefit given the triple negative status of her disease.  Patient's most recent mammogram on March 12, 2018 was reported as BI-RADS 2.  Repeat in April 2020.  Return to clinic 1 to 2 days after her mammogram to discuss the results and further evaluation.    2. Anxiety: Improved. 3. Anemia: Resolved. 4. Peripheral neuropathy: Patient does not complain of this today.  She is no longer taking gabapentin. 5.  Insomnia: Patient was given a prescription for Ambien, but has been instructed that she will require to get future prescriptions for primary care.  Patient expressed understanding and was in agreement with this plan. She also understands that She can call clinic at any time with any questions, concerns, or complaints.   Breast cancer   Staging form: Breast, AJCC 7th Edition      Clinical stage from 06/04/2015: Stage IA (T1c, N0, M0) - Signed by Lloyd Huger, MD on 06/04/2015   Lloyd Huger, MD 09/01/18 3:22 PM

## 2018-08-29 ENCOUNTER — Inpatient Hospital Stay: Payer: BLUE CROSS/BLUE SHIELD | Attending: Oncology | Admitting: Oncology

## 2018-08-29 ENCOUNTER — Encounter: Payer: Self-pay | Admitting: Oncology

## 2018-08-29 ENCOUNTER — Inpatient Hospital Stay: Payer: BLUE CROSS/BLUE SHIELD

## 2018-08-29 VITALS — BP 130/67 | HR 70 | Temp 98.0°F | Resp 18 | Wt 223.8 lb

## 2018-08-29 DIAGNOSIS — Z803 Family history of malignant neoplasm of breast: Secondary | ICD-10-CM

## 2018-08-29 DIAGNOSIS — Z23 Encounter for immunization: Secondary | ICD-10-CM | POA: Insufficient documentation

## 2018-08-29 DIAGNOSIS — Z8249 Family history of ischemic heart disease and other diseases of the circulatory system: Secondary | ICD-10-CM

## 2018-08-29 DIAGNOSIS — Z923 Personal history of irradiation: Secondary | ICD-10-CM | POA: Diagnosis not present

## 2018-08-29 DIAGNOSIS — F419 Anxiety disorder, unspecified: Secondary | ICD-10-CM | POA: Diagnosis not present

## 2018-08-29 DIAGNOSIS — Z853 Personal history of malignant neoplasm of breast: Secondary | ICD-10-CM | POA: Diagnosis present

## 2018-08-29 DIAGNOSIS — G4700 Insomnia, unspecified: Secondary | ICD-10-CM | POA: Insufficient documentation

## 2018-08-29 DIAGNOSIS — Z9221 Personal history of antineoplastic chemotherapy: Secondary | ICD-10-CM | POA: Diagnosis not present

## 2018-08-29 DIAGNOSIS — C50412 Malignant neoplasm of upper-outer quadrant of left female breast: Secondary | ICD-10-CM

## 2018-08-29 MED ORDER — INFLUENZA VAC SPLIT QUAD 0.5 ML IM SUSY
0.5000 mL | PREFILLED_SYRINGE | Freq: Once | INTRAMUSCULAR | Status: AC
  Administered 2018-08-29: 0.5 mL via INTRAMUSCULAR

## 2018-08-29 NOTE — Progress Notes (Signed)
Pt in for follow up, denies any difficulties or concerns.  Reports having mammogram and breast exam since last seen in office.

## 2019-03-16 ENCOUNTER — Ambulatory Visit
Admission: RE | Admit: 2019-03-16 | Discharge: 2019-03-16 | Disposition: A | Discharge status: Home / Self Care | Payer: BLUE CROSS/BLUE SHIELD | Source: Ambulatory Visit | Attending: Oncology | Admitting: Oncology

## 2019-03-16 ENCOUNTER — Other Ambulatory Visit: Payer: Self-pay

## 2019-03-16 DIAGNOSIS — C50412 Malignant neoplasm of upper-outer quadrant of left female breast: Secondary | ICD-10-CM | POA: Diagnosis not present

## 2019-03-16 HISTORY — DX: Personal history of antineoplastic chemotherapy: Z92.21

## 2019-03-16 HISTORY — DX: Personal history of irradiation: Z92.3

## 2019-03-20 ENCOUNTER — Ambulatory Visit: Payer: BLUE CROSS/BLUE SHIELD | Admitting: Oncology

## 2019-04-16 NOTE — Progress Notes (Signed)
[facility]  [address] [place], [place] [address] Phone: [phone]  Fax: [phone]   Clinic Day:  04/20/2019  Referring physician: [name], NP  Chief Complaint: Brittany Ryan is a 58 y.o. 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  HPI: The patient was diagnosed with breast cancer in 2016 after she palpated a lump in the upper left breast.  Bilateral diagnostic mammogram and ultrasound on 04/22/2015 showed a suspicious 1.6cm mass in the left breast at the 12:30 o'clock position 8cm from the nipple. There was no left axillary adenopathy.  Breast biopsy on 04/27/2015 revealed grade II invasive mammary carcinoma.  Tumor was ER negative, PR negative, HER2 negative.  [facility] genetic testing in 05/2015 revealed no clinically significant mutations.   She underwent a left lumpectomy and port placement on 05/19/2015 with Dr. Phoebe Perch.  Pathology confirmed a 1.5 cm grade III invasive mammary carcinoma with medullary features. There was no DCIS.  Margins were negative (4 mm). Left sentinel lymph node was negative for carcinoma.  Tumor was ER negative, PR negative, HER2 negative by IHC. Pathologic stage was T1cN0.  She received 4 cycles of Adriamycin and Cytoxan from 06/10/2015 - 08/05/2015. She received weekly Taxol x 12 from 08/19/2015 - 11/07/2015. She worked every day throughout treatment to support her son who was in college.   She underwent adjuvant radiation with Dr. Noreene Filbert from 12/15/2015 - 01/20/2016. She received 5000 cGy to her left breast with a scar boost of 1400 cGy. She denies any delays in treatment.   She has been on active surveillance and was seen by Dr. Grayland Ormond every 3 months for one year and is now seen every 6 months. Port-a-cath was removed on 04/16/2017 by Dr. Burt Knack.   The patient was last seen in the medical oncology  clinic on 08/29/2018 by Dr. Grayland Ormond.  At that time, she was doing well and asymptomatic. She had insomnia and was given Ambien.  Bilateral diagnostic mammogram on 03/16/2019 revealed no mammographic evidence of malignancy. There were stable lumpectomy changes in the left breast.   CA27.29 has been followed:  12.7 on 02/10/2016, 10.4 on 05/11/2016, 17.9 on 08/24/2016, 15.5 on 11/30/2016, 20.6 on 02/22/2017, and 18.8 on 08/23/2017.  During the interim, she is doing well. Neuropathy continues in her feet and legs, worse at night and in the morning when she ambulates. It usually improves after walking around. Gabapentin did not provide relief. She reports it got worse following chemotherapy.  Neuropathy mildly improved with vitamins. She takes Claritin, Vitamin B, Tylenol, or Advil for pain.   She denies any breast concerns. She does monthly breast exams.  She has difficulty sleeping, "I don't sleep at all." She takes Ambien as needed.   Weight today is 226lbs, up 3lbs from her last clinic visit. She expressed a desire to lose weight and has begun watching what she eats more closely.    Past Medical History:  Diagnosis Date  . Arthritis   . Breast cancer ([place]) 2016   LT LUMPECTOMY  . Frequent sinus infections   . History of chemotherapy 2016   BREAST CA  . Hypertension   . Neuropathy   . Personal history of chemotherapy   . Personal history of radiation therapy   . Radiation 2016-17   BREAST CA  . Seasonal allergies     Past Surgical History:  Procedure Laterality Date  .  BREAST BIOPSY Left 04/27/2015   POS  . BREAST LUMPECTOMY WITH NEEDLE LOCALIZATION Left 05/19/2015   Procedure: BREAST LUMPECTOMY WITH NEEDLE LOCALIZATION;  Surgeon: Florene Glen, MD;  Location: [facility];  Service: General;  Laterality: Left;  . BREAST LUMPECTOMY WITH SENTINEL LYMPH NODE BIOPSY Left 05/19/2015   Procedure: BREAST LUMPECTOMY WITH SENTINEL LYMPH NODE BX;  Surgeon: Florene Glen, MD;  Location:  [facility];  Service: General;  Laterality: Left;  Marland Kitchen MASTECTOMY, PARTIAL Left 05/19/2015   Procedure: MASTECTOMY PARTIAL;  Surgeon: Florene Glen, MD;  Location: [facility];  Service: General;  Laterality: Left;  . PORT-A-CATH REMOVAL Left 04/16/2017   Procedure: REMOVAL PORT-A-CATH;  Surgeon: Florene Glen, MD;  Location: [facility];  Service: General;  Laterality: Left;  . PORTACATH PLACEMENT Left 05/19/2015   Procedure: INSERTION PORT-A-CATH;  Surgeon: Florene Glen, MD;  Location: [facility];  Service: General;  Laterality: Left;    Family History  Problem Relation Age of Onset  . Cancer Father   . Hypertension Father   . CAD Father   . Heart attack Father   . Breast cancer Sister 62  . Breast cancer Paternal Aunt     Social History:  reports that she has never smoked. She has never used smokeless tobacco. She reports current alcohol use. She reports that she does not use drugs. She works in [employer]. Her son is a Pharmacist, hospital but is in school to become a Radio broadcast assistant. The patient is alone today.  Allergies: No Known Allergies  Current Medications: Current Outpatient Medications  Medication Sig Dispense Refill  . acetaminophen (TYLENOL) 500 MG tablet Take 1,000 mg by mouth every 6 (six) hours as needed for mild pain or headache.    . ibuprofen (ADVIL,MOTRIN) 200 MG tablet Take 400 mg by mouth every 8 (eight) hours as needed (for pain/headaches.).     Marland Kitchen zolpidem (AMBIEN) 5 MG tablet Take 1 tablet (5 mg total) by mouth at bedtime as needed. for sleep 30 tablet 3   No current facility-administered medications for this visit.    Facility-Administered Medications Ordered in Other Visits  Medication Dose Route Frequency Provider Last Rate Last Dose  . heparin lock flush 100 unit/mL  500 Units Intravenous Once Lloyd Huger, MD      . heparin lock flush 100 unit/mL  500 Units Intravenous Once Lloyd Huger, MD      . heparin lock flush 100 unit/mL  500 Units Intracatheter  Once PRN Lloyd Huger, MD      . sodium chloride 0.9 % injection 10 mL  10 mL Intracatheter PRN Lloyd Huger, MD   10 mL at 07/22/15 0956  . sodium chloride 0.9 % injection 10 mL  10 mL Intravenous PRN Lloyd Huger, MD   10 mL at 09/02/15 0941  . sodium chloride 0.9 % injection 10 mL  10 mL Intravenous PRN Lloyd Huger, MD      . sodium chloride 0.9 % injection 10 mL  10 mL Intracatheter PRN Lloyd Huger, MD   10 mL at 09/23/15 1205  . sodium chloride 0.9 % injection 10 mL  10 mL Intracatheter PRN Lloyd Huger, MD   10 mL at 09/30/15 0900    Review of Systems  Constitutional: Negative for chills, diaphoresis, fever, malaise/fatigue and weight loss (up 3 pounds).       Feels good.  HENT: Negative.  Negative for congestion, ear pain, hearing loss, nosebleeds and sore  throat.   Eyes: Negative.  Negative for blurred vision, double vision and photophobia.  Respiratory: Negative.  Negative for cough, sputum production and shortness of breath.   Cardiovascular: Negative.  Negative for chest pain, palpitations, orthopnea, leg swelling and PND.  Gastrointestinal: Negative.  Negative for abdominal pain, blood in stool, constipation, diarrhea, heartburn, melena, nausea and vomiting.  Genitourinary: Negative.  Negative for dysuria, frequency, hematuria and urgency.  Musculoskeletal: Negative.  Negative for back pain, joint pain and myalgias.  Skin: Negative.  Negative for itching and rash.  Neurological: Positive for sensory change (neuropathy, feet and legs). Negative for dizziness, tingling, focal weakness, weakness and headaches.  Endo/Heme/Allergies: Positive for environmental allergies (Claritin daily). Does not bruise/bleed easily.  Psychiatric/Behavioral: Negative for depression. The patient is nervous/anxious and has insomnia.   All other systems reviewed and are negative.  Performance status (ECOG):  1  Physical Exam  Constitutional: She is oriented  to person, place, and time. She appears well-developed and well-nourished. No distress.  HENT:  Head: Normocephalic and atraumatic.  Mouth/Throat: Oropharynx is clear and moist. No oropharyngeal exudate.  Curly gray hair.  Eyes: Pupils are equal, round, and reactive to light. Conjunctivae and EOM are normal. No scleral icterus.  Blue eyes.  Neck: Normal range of motion. Neck supple. No JVD present.  Cardiovascular: Normal rate, regular rhythm and normal heart sounds.  No murmur heard. Pulmonary/Chest: Effort normal and breath sounds normal. No respiratory distress. She has no wheezes. She has no rales. Right breast exhibits no inverted nipple, no mass, no nipple discharge, no skin change and no tenderness. Left breast exhibits no inverted nipple, no mass, no nipple discharge, no skin change (post-operative scarring upper quadrant) and no tenderness.  Abdominal: Soft. Bowel sounds are normal. She exhibits no distension. There is no abdominal tenderness. There is no rebound and no guarding.  Musculoskeletal: Normal range of motion.        General: No tenderness or edema.  Lymphadenopathy:    She has no cervical adenopathy.    She has no axillary adenopathy.       Right: No supraclavicular adenopathy present.       Left: No supraclavicular adenopathy present.  Neurological: She is alert and oriented to person, place, and time.  Skin: Skin is warm and dry. No rash noted. She is not diaphoretic.  Psychiatric: She has a normal mood and affect. Her behavior is normal. Judgment and thought content normal.  Nursing note and vitals reviewed.   No visits with results within 3 Day(s) from this visit.  Latest known visit with results is:  Admission on 10/21/2017, Discharged on 10/21/2017  Component Date Value Ref Range Status  . Color, Urine 10/21/2017 ORANGE* YELLOW Final   BIOCHEMICALS MAY BE AFFECTED BY COLOR  . APPearance 10/21/2017 CLEAR  CLEAR Final  . Specific Gravity, Urine 10/21/2017 TEST  NOT REPORTED DUE TO COLOR INTERFERENCE OF URINE PIGMENT  1.005 - 1.030 Final  . pH 10/21/2017 TEST NOT REPORTED DUE TO COLOR INTERFERENCE OF URINE PIGMENT  5.0 - 8.0 Final  . Glucose, UA 10/21/2017 TEST NOT REPORTED DUE TO COLOR INTERFERENCE OF URINE PIGMENT* NEGATIVE mg/dL Final  . Hgb urine dipstick 10/21/2017 TEST NOT REPORTED DUE TO COLOR INTERFERENCE OF URINE PIGMENT* NEGATIVE Final  . Bilirubin Urine 10/21/2017 TEST NOT REPORTED DUE TO COLOR INTERFERENCE OF URINE PIGMENT* NEGATIVE Final  . Ketones, ur 10/21/2017 TEST NOT REPORTED DUE TO COLOR INTERFERENCE OF URINE PIGMENT* NEGATIVE mg/dL Final  . Protein, ur 10/21/2017 TEST  NOT REPORTED DUE TO COLOR INTERFERENCE OF URINE PIGMENT* NEGATIVE mg/dL Final  . Nitrite 10/21/2017 TEST NOT REPORTED DUE TO COLOR INTERFERENCE OF URINE PIGMENT* NEGATIVE Final  . Leukocytes, UA 10/21/2017 TEST NOT REPORTED DUE TO COLOR INTERFERENCE OF URINE PIGMENT* NEGATIVE Final  . Squamous Epithelial / LPF 10/21/2017 NONE SEEN  NONE SEEN Final  . WBC, UA 10/21/2017 6-30  0 - 5 WBC/hpf Final  . RBC / HPF 10/21/2017 NONE SEEN  0 - 5 RBC/hpf Final  . Bacteria, UA 10/21/2017 NONE SEEN  NONE SEEN Final  . Specimen Description 10/21/2017 URINE, RANDOM   Final  . Special Requests 10/21/2017 NONE   Final  . Culture 10/21/2017 MULTIPLE SPECIES PRESENT, SUGGEST RECOLLECTION*  Final  . Report Status 10/21/2017 10/23/2017 FINAL   Final    Assessment:  Brittany Ryan is a 58 y.o. 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  Pathology revealed a 1.5 cm grade III invasive mammary carcinoma with medullary features.  There was no DCIS.  Margins were close (4 mm).  One sentinel lymph node was negative.  Tumor was ER negative, PR negative, HER2 negative by IHC.   Pathologic stage was T1cN0.  [employer] genetic testing was negative.  She received 4 cycles of  Adriamycin and Cytoxan from 06/10/2015 - 08/05/2015.  She received weekly Taxol x 12 from 08/19/2015 - 11/07/2015.   She underwent 5000 cGy to her left breast with a scar boost of 1400 cGy from 12/15/2015 - 01/20/2016.   Bilateral diagnostic mammogram on 03/16/2019 revealed no mammographic evidence of malignancy.  CA27.29 has been followed:  12.7 on 02/10/2016, 10.4 on 05/11/2016, 17.9 on 08/24/2016, 15.5 on 11/30/2016, 20.6 on 02/22/2017, 18.8 on 08/23/2017, and 19.5 on 04/20/2019.  Symptomatically, she is doing well.  She has a residual neuropathy.  Exam is unremarkable.  Plan: 1.   Labs today:  CBC with diff, CMP, CA27.29. 2.   Stage IA triple negative left breast cancer  Discuss entire medical history, diagnosis and staging of breast cancer.  She has received AC x 4 followed by weekly Taxol x 12.  Review interval mammogram- no evidence of disease.  Clinically she is doing well without evidence of recurrent disease.  Discuss plan for follow-up every 6 months until 5 years post chemotherapy then annually. 3.   Peripheral Neuropathy  Grade II Taxol induced peripheral neuropathy.  Check B12 and folate.  Continue to monitor. 4.   Weight gain  Discuss caloric intake and exercise.  Check TSH 5.   RTC in 6 months for MD assessment and labs (CBC with diff, CMP, CA27.29).  I discussed the assessment and treatment plan with the patient.  The patient was provided an opportunity to ask questions and all were answered.  The patient agreed with the plan and demonstrated an understanding of the instructions.  The patient was advised to call back if the symptoms worsen or if the condition fails to improve as anticipated.  Addendum:  B12 level was 269 (low).  Patient contacted and instructed to begin B12 1000 mcg po q day.  Check level in 1 month.   Lequita Asal, MD, PhD    04/20/2019, 9:02 AM  I, Molly Dorshimer, am acting as Education administrator for [employer]. Mike Gip, MD, PhD.  I,  C.  Mike Gip, MD, have reviewed the above documentation for accuracy and completeness, and I agree with the  above.

## 2019-04-19 ENCOUNTER — Other Ambulatory Visit: Payer: Self-pay | Admitting: Hematology and Oncology

## 2019-04-19 ENCOUNTER — Other Ambulatory Visit: Payer: Self-pay

## 2019-04-19 DIAGNOSIS — C50412 Malignant neoplasm of upper-outer quadrant of left female breast: Secondary | ICD-10-CM

## 2019-04-20 ENCOUNTER — Inpatient Hospital Stay: Payer: BLUE CROSS/BLUE SHIELD | Attending: Hematology and Oncology | Admitting: Hematology and Oncology

## 2019-04-20 ENCOUNTER — Encounter: Payer: Self-pay | Admitting: Hematology and Oncology

## 2019-04-20 ENCOUNTER — Inpatient Hospital Stay: Payer: BLUE CROSS/BLUE SHIELD

## 2019-04-20 VITALS — BP 155/85 | HR 78 | Temp 97.8°F | Resp 18 | Ht 69.0 in | Wt 226.4 lb

## 2019-04-20 DIAGNOSIS — C50412 Malignant neoplasm of upper-outer quadrant of left female breast: Secondary | ICD-10-CM

## 2019-04-20 DIAGNOSIS — Z171 Estrogen receptor negative status [ER-]: Secondary | ICD-10-CM | POA: Insufficient documentation

## 2019-04-20 DIAGNOSIS — Z8249 Family history of ischemic heart disease and other diseases of the circulatory system: Secondary | ICD-10-CM | POA: Diagnosis not present

## 2019-04-20 DIAGNOSIS — G629 Polyneuropathy, unspecified: Secondary | ICD-10-CM | POA: Insufficient documentation

## 2019-04-20 DIAGNOSIS — Z791 Long term (current) use of non-steroidal anti-inflammatories (NSAID): Secondary | ICD-10-CM

## 2019-04-20 DIAGNOSIS — R635 Abnormal weight gain: Secondary | ICD-10-CM

## 2019-04-20 DIAGNOSIS — Z803 Family history of malignant neoplasm of breast: Secondary | ICD-10-CM | POA: Insufficient documentation

## 2019-04-20 DIAGNOSIS — G4700 Insomnia, unspecified: Secondary | ICD-10-CM | POA: Diagnosis not present

## 2019-04-20 LAB — COMPREHENSIVE METABOLIC PANEL
ALT: 33 U/L (ref 0–44)
AST: 33 U/L (ref 15–41)
Albumin: 4.3 g/dL (ref 3.5–5.0)
Alkaline Phosphatase: 78 U/L (ref 38–126)
Anion gap: 10 (ref 5–15)
BUN: 13 mg/dL (ref 6–20)
CO2: 25 mmol/L (ref 22–32)
Calcium: 9.1 mg/dL (ref 8.9–10.3)
Chloride: 100 mmol/L (ref 98–111)
Creatinine, Ser: 0.82 mg/dL (ref 0.44–1.00)
GFR calc Af Amer: >60 mL/min (ref >60)
GFR calc non Af Amer: >60 mL/min (ref >60)
Glucose, Bld: 118 mg/dL — ABNORMAL HIGH (ref 70–99)
Potassium: 4.2 mmol/L (ref 3.5–5.1)
Sodium: 135 mmol/L (ref 135–145)
Total Bilirubin: 0.7 mg/dL (ref 0.3–1.2)
Total Protein: 7.9 g/dL (ref 6.5–8.1)

## 2019-04-20 LAB — CBC WITH DIFFERENTIAL/PLATELET
Abs Immature Granulocytes: 0.01 10*3/uL (ref 0.00–0.07)
Basophils Absolute: 0.1 10*3/uL (ref 0.0–0.1)
Basophils Relative: 2 %
Eosinophils Absolute: 0.2 10*3/uL (ref 0.0–0.5)
Eosinophils Relative: 4 %
HCT: 40.4 % (ref 36.0–46.0)
Hemoglobin: 13.6 g/dL (ref 12.0–15.0)
Immature Granulocytes: 0 %
Lymphocytes Relative: 27 %
Lymphs Abs: 1.2 10*3/uL (ref 0.7–4.0)
MCH: 32.5 pg (ref 26.0–34.0)
MCHC: 33.7 g/dL (ref 30.0–36.0)
MCV: 96.7 fL (ref 80.0–100.0)
Monocytes Absolute: 0.4 10*3/uL (ref 0.1–1.0)
Monocytes Relative: 10 %
Neutro Abs: 2.6 10*3/uL (ref 1.7–7.7)
Neutrophils Relative %: 57 %
Platelets: 289 10*3/uL (ref 150–400)
RBC: 4.18 MIL/uL (ref 3.87–5.11)
RDW: 12.7 % (ref 11.5–15.5)
WBC: 4.5 10*3/uL (ref 4.0–10.5)
nRBC: 0 % (ref 0.0–0.2)

## 2019-04-20 LAB — FOLATE: Folate: 27 ng/mL (ref >5.9)

## 2019-04-20 LAB — TSH: TSH: 3.941 u[IU]/mL (ref 0.350–4.500)

## 2019-04-20 MED ORDER — ZOLPIDEM TARTRATE 5 MG PO TABS: 5.0000 mg | ORAL_TABLET | Freq: Every evening | ORAL | 0 refills | Status: DC | PRN

## 2019-04-21 ENCOUNTER — Other Ambulatory Visit: Payer: Self-pay

## 2019-04-21 ENCOUNTER — Telehealth: Payer: Self-pay

## 2019-04-21 DIAGNOSIS — G4700 Insomnia, unspecified: Secondary | ICD-10-CM

## 2019-04-21 DIAGNOSIS — E538 Deficiency of other specified B group vitamins: Secondary | ICD-10-CM

## 2019-04-21 LAB — CANCER ANTIGEN 27.29: CA 27.29: 19.5 U/mL (ref 0.0–38.6)

## 2019-04-21 LAB — VITAMIN B12: Vitamin B-12: 269 pg/mL (ref 180–914)

## 2019-04-21 MED ORDER — ZOLPIDEM TARTRATE 5 MG PO TABS: 5.0000 mg | ORAL_TABLET | Freq: Every evening | ORAL | 0 refills | Status: DC | PRN

## 2019-04-21 NOTE — Telephone Encounter (Signed)
-----   Message from Lequita Asal, MD sent at 04/21/2019 11:20 AM EDT ----- Regarding: Please call patient  B12 level is low normal.  Goal B12 is 400.  Begin oral B12 1000 mcg a day.  Recheck level in 1 month.  M ----- Message ----- From: Buel Ream, [facility] [place] Sent: 04/20/2019   9:48 AM EDT To: Lequita Asal, MD

## 2019-04-21 NOTE — Telephone Encounter (Signed)
With patient's permission, left detailed VM informing patient of B12 level and to start Vitamin B12 1000 MCG daily per Dr. Kem Parkinson request. Also informed Dr. Mike Gip would like to recheck level in one month. Phone number provided should any questions arise.

## 2019-05-18 ENCOUNTER — Telehealth: Payer: Self-pay | Admitting: Hematology and Oncology

## 2019-05-18 NOTE — Telephone Encounter (Signed)
Called pt for appt pre-screen but got no answer. Left vm msg about screening and new guidelines about mask req, no visitors, screening questions they will be asked, and fever checks °

## 2019-05-19 ENCOUNTER — Other Ambulatory Visit: Payer: Self-pay

## 2019-05-19 ENCOUNTER — Inpatient Hospital Stay: Payer: BLUE CROSS/BLUE SHIELD | Attending: Hematology and Oncology

## 2019-05-19 DIAGNOSIS — Z791 Long term (current) use of non-steroidal anti-inflammatories (NSAID): Secondary | ICD-10-CM | POA: Insufficient documentation

## 2019-05-19 DIAGNOSIS — Z803 Family history of malignant neoplasm of breast: Secondary | ICD-10-CM | POA: Diagnosis not present

## 2019-05-19 DIAGNOSIS — G629 Polyneuropathy, unspecified: Secondary | ICD-10-CM | POA: Diagnosis not present

## 2019-05-19 DIAGNOSIS — Z171 Estrogen receptor negative status [ER-]: Secondary | ICD-10-CM | POA: Diagnosis not present

## 2019-05-19 DIAGNOSIS — G4700 Insomnia, unspecified: Secondary | ICD-10-CM | POA: Diagnosis not present

## 2019-05-19 DIAGNOSIS — C50412 Malignant neoplasm of upper-outer quadrant of left female breast: Secondary | ICD-10-CM | POA: Diagnosis present

## 2019-05-19 DIAGNOSIS — E538 Deficiency of other specified B group vitamins: Secondary | ICD-10-CM

## 2019-05-19 LAB — VITAMIN B12: Vitamin B-12: 451 pg/mL (ref 180–914)

## 2019-05-21 ENCOUNTER — Encounter: Payer: Self-pay | Admitting: Emergency Medicine

## 2019-05-21 ENCOUNTER — Ambulatory Visit
Admission: EM | Admit: 2019-05-21 | Discharge: 2019-05-21 | Disposition: A | Discharge status: Home / Self Care | Payer: BLUE CROSS/BLUE SHIELD | Attending: Family Medicine | Admitting: Family Medicine

## 2019-05-21 ENCOUNTER — Other Ambulatory Visit: Payer: Self-pay

## 2019-05-21 DIAGNOSIS — M545 Low back pain, unspecified: Secondary | ICD-10-CM

## 2019-05-21 DIAGNOSIS — R350 Frequency of micturition: Secondary | ICD-10-CM | POA: Diagnosis not present

## 2019-05-21 LAB — URINALYSIS, COMPLETE (UACMP) WITH MICROSCOPIC
Bacteria, UA: NONE SEEN
Bilirubin Urine: NEGATIVE
Glucose, UA: NEGATIVE mg/dL
Hgb urine dipstick: NEGATIVE
Ketones, ur: NEGATIVE mg/dL
Nitrite: NEGATIVE
Protein, ur: NEGATIVE mg/dL
RBC / HPF: NONE SEEN RBC/hpf (ref 0–5)
Specific Gravity, Urine: 1.015 (ref 1.005–1.030)
pH: 7 (ref 5.0–8.0)

## 2019-05-21 MED ORDER — TIZANIDINE HCL 4 MG PO TABS: 4.0000 mg | ORAL_TABLET | Freq: Three times a day (TID) | ORAL | 0 refills | Status: DC | PRN

## 2019-05-21 MED ORDER — MELOXICAM 15 MG PO TABS: 15.0000 mg | ORAL_TABLET | Freq: Every day | ORAL | 0 refills | Status: DC | PRN

## 2019-05-21 NOTE — ED Provider Notes (Signed)
[facility]    CSN: [id number] Arrival date & time: 05/21/19  0802  History   Chief Complaint Chief Complaint  Patient presents with  . Back Pain  . Urinary Frequency   HPI   58 year old female presents with the above complaints.  Patient reports that she developed right-sided low back pain on Saturday.  This has slowly progressed.  She now has bilateral low back pain.  Worse with certain activities.  No relieving factors.  Patient states that her pain is 8/10 in severity.  Additionally, patient has had some urinary symptoms as of Tuesday.  She reports urinary frequency and urgency.  No dysuria.  Also reports some lower abdominal discomfort.  She is unsure whether she is experiencing UTI or whether this is just related to back pain.  No fever.  She has taken some Azo without resolution.  She has not taken any today.  No other reported symptoms.  No other complaints.  PMH, Surgical Hx, Family Hx, Social History reviewed and updated as below.  Past Medical History:  Diagnosis Date  . Arthritis   . Breast cancer ([place]) 2016   LT LUMPECTOMY  . Frequent sinus infections   . History of chemotherapy 2016   BREAST CA  . Hypertension   . Neuropathy   . Personal history of chemotherapy   . Personal history of radiation therapy   . Radiation 2016-17   BREAST CA  . Seasonal allergies    Patient Active Problem List   Diagnosis Date Noted  . Seasonal allergies   . Hx of radiation therapy   . Hypertension   . Frequent sinus infections   . Primary cancer of upper outer quadrant of left female breast ([place])   . History of chemotherapy 12/03/2014   Past Surgical History:  Procedure Laterality Date  . BREAST BIOPSY Left 04/27/2015   POS  . BREAST LUMPECTOMY WITH NEEDLE LOCALIZATION Left 05/19/2015   Procedure: BREAST LUMPECTOMY WITH NEEDLE LOCALIZATION;  Surgeon: Florene Glen, MD;  Location: [facility];  Service: General;  Laterality: Left;  . BREAST LUMPECTOMY WITH  SENTINEL LYMPH NODE BIOPSY Left 05/19/2015   Procedure: BREAST LUMPECTOMY WITH SENTINEL LYMPH NODE BX;  Surgeon: Florene Glen, MD;  Location: [facility];  Service: General;  Laterality: Left;  Marland Kitchen MASTECTOMY, PARTIAL Left 05/19/2015   Procedure: MASTECTOMY PARTIAL;  Surgeon: Florene Glen, MD;  Location: [facility];  Service: General;  Laterality: Left;  . PORT-A-CATH REMOVAL Left 04/16/2017   Procedure: REMOVAL PORT-A-CATH;  Surgeon: Florene Glen, MD;  Location: [facility];  Service: General;  Laterality: Left;  . PORTACATH PLACEMENT Left 05/19/2015   Procedure: INSERTION PORT-A-CATH;  Surgeon: Florene Glen, MD;  Location: [facility];  Service: General;  Laterality: Left;    OB History   No obstetric history on file.      Home Medications    Prior to Admission medications   Medication Sig Start Date End Date Taking? Authorizing Provider  zolpidem (AMBIEN) 5 MG tablet Take 1 tablet (5 mg total) by mouth at bedtime as needed. for sleep 04/21/19  Yes Corcoran, Drue Second, MD  acetaminophen (TYLENOL) 500 MG tablet Take 1,000 mg by mouth every 6 (six) hours as needed for mild pain or headache.    [provider]  meloxicam (MOBIC) 15 MG tablet Take 1 tablet (15 mg total) by mouth daily as needed. 05/21/19   Coral Spikes, DO  tiZANidine (ZANAFLEX) 4 MG tablet Take 1 tablet (4 mg  total) by mouth every 8 (eight) hours as needed for muscle spasms. 05/21/19   Coral Spikes, DO    Family History Family History  Problem Relation Age of Onset  . Cancer Father   . Hypertension Father   . CAD Father   . Heart attack Father   . Breast cancer Sister 84  . Breast cancer Paternal Aunt     Social History Social History   Tobacco Use  . Smoking status: Never Smoker  . Smokeless tobacco: Never Used  Substance Use Topics  . Alcohol use: Yes    Comment: rare beer  . Drug use: No     Allergies   Patient has no known allergies.   Review of Systems Review of Systems   Constitutional: Negative.   Genitourinary: Positive for frequency and urgency.  Musculoskeletal: Positive for back pain.   Physical Exam Triage Vital Signs ED Triage Vitals  Enc Vitals Group     BP 05/21/19 0817 (!) 154/95     Pulse Rate 05/21/19 0817 69     Resp 05/21/19 0817 16     Temp 05/21/19 0817 98.2 F (36.8 C)     Temp Source 05/21/19 0817 Oral     SpO2 05/21/19 0817 98 %     Weight 05/21/19 0814 210 lb (95.3 kg)     Height 05/21/19 0814 5\' 9"  (1.753 m)     Head Circumference --      Peak Flow --      Pain Score 05/21/19 0814 8     Pain Loc --      Pain Edu? --      Excl. in [place]? --    Updated Vital Signs BP (!) 154/95 (BP Location: Right Arm)   Pulse 69   Temp 98.2 F (36.8 C) (Oral)   Resp 16   Ht 5\' 9"  (1.753 m)   Wt 95.3 kg   SpO2 98%   BMI 31.01 kg/m   Visual Acuity Right Eye Distance:   Left Eye Distance:   Bilateral Distance:    Right Eye Near:   Left Eye Near:    Bilateral Near:     Physical Exam Vitals signs and nursing note reviewed.  Constitutional:      General: She is not in acute distress.    Appearance: Normal appearance. She is obese.  HENT:     Head: Normocephalic and atraumatic.  Eyes:     General:        Right eye: No discharge.        Left eye: No discharge.     Conjunctiva/sclera: Conjunctivae normal.  Cardiovascular:     Rate and Rhythm: Normal rate and regular rhythm.  Pulmonary:     Effort: Pulmonary effort is normal.     Breath sounds: Normal breath sounds.  Abdominal:     General: There is no distension.     Palpations: Abdomen is soft.     Comments: Mild tenderness in the lower abdomen.  Musculoskeletal:     Comments: Bilateral tenderness to palpation of the paraspinal regions of the lumbar spine.  Neurological:     Mental Status: She is alert.  Psychiatric:        Mood and Affect: Mood normal.        Behavior: Behavior normal.    UC Treatments / Results  Labs (all labs ordered are listed, but only  abnormal results are displayed) Labs Reviewed  URINALYSIS, COMPLETE (UACMP) WITH MICROSCOPIC - Abnormal; Notable  for the following components:      Result Value   Leukocytes,Ua TRACE (*)    All other components within normal limits  URINE CULTURE    EKG None  Radiology No results found.  Procedures Procedures (including critical care time)  Medications Ordered in UC Medications - No data to display  Initial Impression / Assessment and Plan / UC Course  I have reviewed the triage vital signs and the nursing notes.  Pertinent labs & imaging results that were available during my care of the patient were reviewed by me and considered in my medical decision making (see chart for details).    58 year old female presents with low back pain.  UA does not appear to be consistent with UTI.  Sending culture.  Treating back pain with meloxicam and zanaflex.  Final Clinical Impressions(s) / UC Diagnoses   Final diagnoses:  Acute bilateral low back pain without sciatica  Urinary frequency     Discharge Instructions     Rest.  Medication as prescribed.  Take care  Dr. Lacinda Axon     ED Prescriptions    Medication Sig Dispense Auth. Provider   meloxicam (MOBIC) 15 MG tablet Take 1 tablet (15 mg total) by mouth daily as needed. 30 tablet Kailah Pennel G, DO   tiZANidine (ZANAFLEX) 4 MG tablet Take 1 tablet (4 mg total) by mouth every 8 (eight) hours as needed for muscle spasms. 30 tablet Coral Spikes, DO     Controlled Substance Prescriptions [place] Controlled Substance Registry consulted? Not Applicable   Coral Spikes, [place] 05/21/19 [id number]

## 2019-05-21 NOTE — ED Triage Notes (Signed)
Patient c/o lower back pain and increase in urinary frequency that started on Saturday and started to get worse on Tuesday.  Patient denies fevers.

## 2019-05-21 NOTE — Discharge Instructions (Signed)
Rest  Medication as prescribed.  Take care  Dr. Darling Cieslewicz  

## 2019-05-22 LAB — URINE CULTURE: Culture: NO GROWTH

## 2019-10-07 ENCOUNTER — Other Ambulatory Visit: Payer: Self-pay

## 2019-10-07 ENCOUNTER — Ambulatory Visit
Admission: EM | Admit: 2019-10-07 | Discharge: 2019-10-07 | Disposition: A | Discharge status: Home / Self Care | Payer: BLUE CROSS/BLUE SHIELD | Attending: Family Medicine | Admitting: Family Medicine

## 2019-10-07 ENCOUNTER — Encounter: Payer: Self-pay | Admitting: Emergency Medicine

## 2019-10-07 DIAGNOSIS — N390 Urinary tract infection, site not specified: Secondary | ICD-10-CM

## 2019-10-07 LAB — URINALYSIS, COMPLETE (UACMP) WITH MICROSCOPIC
Bilirubin Urine: NEGATIVE
Glucose, UA: NEGATIVE mg/dL
Hgb urine dipstick: NEGATIVE
Ketones, ur: NEGATIVE mg/dL
Leukocytes,Ua: NEGATIVE
Nitrite: POSITIVE — AB
Protein, ur: NEGATIVE mg/dL
Specific Gravity, Urine: 1.02 (ref 1.005–1.030)
Squamous Epithelial / HPF: NONE SEEN (ref 0–5)
pH: 6.5 (ref 5.0–8.0)

## 2019-10-07 MED ORDER — CEPHALEXIN 500 MG PO CAPS: 500.0000 mg | ORAL_CAPSULE | Freq: Two times a day (BID) | ORAL | 0 refills | Status: AC

## 2019-10-07 MED ORDER — FLUCONAZOLE 150 MG PO TABS: 150.0000 mg | ORAL_TABLET | Freq: Every day | ORAL | 0 refills | Status: DC

## 2019-10-07 NOTE — ED Provider Notes (Signed)
[facility] ____________________________________________  Time seen: Approximately 9:49 AM  I have reviewed the triage vital signs and the nursing notes.   HISTORY  Chief Complaint Urinary Frequency and Urinary Urgency   HPI Brittany Ryan is a 58 y.o. female presenting for evaluation of urinary frequency and urinary urgency for the last 1 week.  Reports she had a leftover antibiotic, unsure of name, and some Azo that she took that helped some but did not resolve symptoms.  Has had some diffuse lower back pain and unsure if it is related to muscular pain or current.  Denies vaginal complaints.  Denies cough, chest pain or shortness of breath or fevers.  Continues to eat and drink well.  Denies other aggravating or alleviating factors.  Brittany Good, NP: PCP   Past Medical History:  Diagnosis Date  . Arthritis   . Breast cancer ([place]) 2016   LT LUMPECTOMY  . Frequent sinus infections   . History of chemotherapy 2016   BREAST CA  . Hypertension   . Neuropathy   . Personal history of chemotherapy   . Personal history of radiation therapy   . Radiation 2016-17   BREAST CA  . Seasonal allergies     Patient Active Problem List   Diagnosis Date Noted  . Seasonal allergies   . Hx of radiation therapy   . Hypertension   . Frequent sinus infections   . Primary cancer of upper outer quadrant of left female breast ([place])   . History of chemotherapy 12/03/2014    Past Surgical History:  Procedure Laterality Date  . BREAST BIOPSY Left 04/27/2015   POS  . BREAST LUMPECTOMY WITH NEEDLE LOCALIZATION Left 05/19/2015   Procedure: BREAST LUMPECTOMY WITH NEEDLE LOCALIZATION;  Surgeon: Florene Glen, MD;  Location: [facility];  Service: General;  Laterality: Left;  . BREAST LUMPECTOMY WITH SENTINEL LYMPH NODE BIOPSY Left 05/19/2015   Procedure: BREAST LUMPECTOMY WITH SENTINEL LYMPH NODE BX;  Surgeon: Florene Glen, MD;  Location: [facility];  Service: General;  Laterality:  Left;  Marland Kitchen MASTECTOMY, PARTIAL Left 05/19/2015   Procedure: MASTECTOMY PARTIAL;  Surgeon: Florene Glen, MD;  Location: [facility];  Service: General;  Laterality: Left;  . PORT-A-CATH REMOVAL Left 04/16/2017   Procedure: REMOVAL PORT-A-CATH;  Surgeon: Florene Glen, MD;  Location: [facility];  Service: General;  Laterality: Left;  . PORTACATH PLACEMENT Left 05/19/2015   Procedure: INSERTION PORT-A-CATH;  Surgeon: Florene Glen, MD;  Location: [facility];  Service: General;  Laterality: Left;     No current facility-administered medications for this encounter.   Current Outpatient Medications:  .  loratadine (CLARITIN) 10 MG tablet, Take 10 mg by mouth daily., Disp: , Rfl:  .  MELATONIN PO, Take by mouth., Disp: , Rfl:  .  cephALEXin (KEFLEX) 500 MG capsule, Take 1 capsule (500 mg total) by mouth 2 (two) times daily for 7 days., Disp: 14 capsule, Rfl: 0 .  fluconazole (DIFLUCAN) 150 MG tablet, Take 1 tablet (150 mg total) by mouth daily. Take one pill orally as needed, Disp: 1 tablet, Rfl: 0  Facility-Administered Medications Ordered in Other Encounters:  .  heparin lock flush 100 unit/mL, 500 Units, Intravenous, Once, Finnegan, Kathlene November, MD .  heparin lock flush 100 unit/mL, 500 Units, Intravenous, Once, Finnegan, Kathlene November, MD .  heparin lock flush 100 unit/mL, 500 Units, Intracatheter, Once PRN, Lloyd Huger, MD .  sodium chloride 0.9 % injection 10 mL, 10 mL, Intracatheter, PRN, Grayland Ormond,  Kathlene November, MD, 10 mL at 07/22/15 0956 .  sodium chloride 0.9 % injection 10 mL, 10 mL, Intravenous, PRN, Lloyd Huger, MD, 10 mL at 09/02/15 0941 .  sodium chloride 0.9 % injection 10 mL, 10 mL, Intravenous, PRN, Grayland Ormond, Kathlene November, MD .  sodium chloride 0.9 % injection 10 mL, 10 mL, Intracatheter, PRN, Lloyd Huger, MD, 10 mL at 09/23/15 1205 .  sodium chloride 0.9 % injection 10 mL, 10 mL, Intracatheter, PRN, Lloyd Huger, MD, 10 mL at 09/30/15 0900  Allergies Patient  has no known allergies.  Family History  Problem Relation Age of Onset  . Cancer Father   . Hypertension Father   . CAD Father   . Heart attack Father   . Hypertension Mother   . Breast cancer Sister 30  . Breast cancer Paternal Aunt     Social History Social History   Tobacco Use  . Smoking status: Never Smoker  . Smokeless tobacco: Never Used  Substance Use Topics  . Alcohol use: Yes    Comment: rare beer  . Drug use: No    Review of Systems Constitutional: No fever ENT: No sore throat. Cardiovascular: Denies chest pain. Respiratory: Denies shortness of breath. Gastrointestinal: No abdominal pain.  No nausea, no vomiting.  No diarrhea.  Genitourinary: Positive for dysuria. Musculoskeletal: Positive for back pain. Skin: Negative for rash.  ____________________________________________   PHYSICAL EXAM:  VITAL SIGNS: ED Triage Vitals  Enc Vitals Group     BP 10/07/19 0852 (!) 161/91     Pulse Rate 10/07/19 0852 66     Resp 10/07/19 0852 16     Temp 10/07/19 0852 98.2 F (36.8 C)     Temp Source 10/07/19 0852 Oral     SpO2 10/07/19 0852 97 %     Weight 10/07/19 0853 210 lb (95.3 kg)     Height 10/07/19 0853 5\' 9"  (1.753 m)     Head Circumference --      Peak Flow --      Pain Score 10/07/19 0853 7     Pain Loc --      Pain Edu? --      Excl. in [place]? --     Constitutional: Alert and oriented. Well appearing and in no acute distress. Eyes: Conjunctivae are normal.  ENT      Head: Normocephalic and atraumatic. Cardiovascular: Normal rate, regular rhythm. Grossly normal heart sounds.  Ryan peripheral circulation. Respiratory: Normal respiratory effort without tachypnea nor retractions. Breath sounds are clear and equal bilaterally. No wheezes, rales, rhonchi. Gastrointestinal: Soft and nontender. No CVA tenderness. Musculoskeletal: Steady gait.  No flank tenderness palpation. Neurologic:  Normal speech and language. Speech is normal. No gait instability.   Skin:  Skin is warm, dry and intact. No rash noted. Psychiatric: Mood and affect are normal. Speech and behavior are normal. Patient exhibits appropriate insight and judgment   ___________________________________________   LABS (all labs ordered are listed, but only abnormal results are displayed)  Labs Reviewed  URINALYSIS, COMPLETE (UACMP) WITH MICROSCOPIC - Abnormal; Notable for the following components:      Result Value   Nitrite POSITIVE (*)    Bacteria, UA FEW (*)    All other components within normal limits  URINE CULTURE   ____________________________________________  PROCEDURES Procedures    INITIAL IMPRESSION / ASSESSMENT AND PLAN / ED COURSE  Pertinent labs & imaging results that were available during my care of the patient were reviewed by me  and considered in my medical decision making (see chart for details).  Appearing patient.  No acute distress.  Urinalysis reviewed, concern for UTI.  Will culture.  Will empirically start oral Keflex.  Patient requests Rx for Diflucan, Rx given.  Encouraged rest, fluids, supportive care monitoring.Discussed indication, risks and benefits of medications with patient.   Discussed follow up with Primary care physician this week. Discussed follow up and return parameters including no resolution or any worsening concerns. Patient verbalized understanding and agreed to plan.   ____________________________________________   FINAL CLINICAL IMPRESSION(S) / ED DIAGNOSES  Final diagnoses:  Urinary tract infection without hematuria, site unspecified     ED Discharge Orders         Ordered    cephALEXin (KEFLEX) 500 MG capsule  2 times daily     10/07/19 0934    fluconazole (DIFLUCAN) 150 MG tablet  Daily     10/07/19 0934           Note: This dictation was prepared with Dragon dictation along with smaller phrase technology. Any transcriptional errors that result from this process are unintentional.         Marylene Land, NP 10/07/19 [phone]

## 2019-10-07 NOTE — Discharge Instructions (Signed)
Take medication as prescribed. Rest. Drink plenty of fluids.  ° °Follow up with your primary care physician this week as needed. Return to Urgent care for new or worsening concerns.  ° °

## 2019-10-07 NOTE — ED Triage Notes (Signed)
Patient in today c/o urinary frequency, urgency and pain x 1 week. Patient has been taking Azo and she took leftover antibiotics over the weekend.

## 2019-10-08 LAB — URINE CULTURE: Culture: NO GROWTH

## 2019-10-18 NOTE — Progress Notes (Signed)
[facility]  [address] [place], [place] [address] Phone: [phone]  Fax: [phone]   Clinic Day:  10/21/2019  Referring physician: [name], NP  Chief Complaint: Brittany Ryan is a 58 y.o. 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   HPI: The patient was last seen in the medical oncology clinic on 04/20/2019 as a new patient. At that time, she was doing well. She had a residual neuropathy. Exam was unremarkable. CBC and CMP were normal. Folate was 27.0.  Vitamin B12 was 269. TSH was 3.941. CA27.29 was 19.5. Surveillance continued.   She was contacted to begin oral B-12 1,000 mcg daily on 04/21/2019. B-12 goal is 400.   Follow-up B12 level was 451 on 05/19/2019.  During the interim, she has felt fine. Patient noted she was taking between 2,500-5,000 mcg of B-12. She describes neuropathy in her legs and feet. She has numbness in her feet and legs. In the mornings she feels stiff. She does not like gabapentin. She notes insomnia. She performs self-breast exams every month.   The patient notes starting a new diet x 1 month ago. She is drinking a lot of protein shakes. She is drinking 5 bottles of water a day. She states that her ankles tend to swell (L>R). She notes that after being on her feet all day, her ankles swell. At her job, she is always on her feet.   Patient was interested in having a flu shot today.   Past Medical History:  Diagnosis Date  . Arthritis   . Breast cancer ([place]) 2016   LT LUMPECTOMY  . Frequent sinus infections   . History of chemotherapy 2016   BREAST CA  . Hypertension   . Neuropathy   . Personal history of chemotherapy   . Personal history of radiation therapy   . Radiation 2016-17   BREAST CA  . Seasonal allergies     Past Surgical History:  Procedure Laterality Date  . BREAST BIOPSY Left 04/27/2015   POS  .  BREAST LUMPECTOMY WITH NEEDLE LOCALIZATION Left 05/19/2015   Procedure: BREAST LUMPECTOMY WITH NEEDLE LOCALIZATION;  Surgeon: Florene Glen, MD;  Location: [facility];  Service: General;  Laterality: Left;  . BREAST LUMPECTOMY WITH SENTINEL LYMPH NODE BIOPSY Left 05/19/2015   Procedure: BREAST LUMPECTOMY WITH SENTINEL LYMPH NODE BX;  Surgeon: Florene Glen, MD;  Location: [facility];  Service: General;  Laterality: Left;  Marland Kitchen MASTECTOMY, PARTIAL Left 05/19/2015   Procedure: MASTECTOMY PARTIAL;  Surgeon: Florene Glen, MD;  Location: [facility];  Service: General;  Laterality: Left;  . PORT-A-CATH REMOVAL Left 04/16/2017   Procedure: REMOVAL PORT-A-CATH;  Surgeon: Florene Glen, MD;  Location: [facility];  Service: General;  Laterality: Left;  . PORTACATH PLACEMENT Left 05/19/2015   Procedure: INSERTION PORT-A-CATH;  Surgeon: Florene Glen, MD;  Location: [facility];  Service: General;  Laterality: Left;    Family History  Problem Relation Age of Onset  . Cancer Father   . Hypertension Father   . CAD Father   . Heart attack Father   . Hypertension Mother   . Breast cancer Sister 27  . Breast cancer Paternal Aunt     Social History:  reports that she has never smoked. She has never used smokeless tobacco. She reports current alcohol use. She reports that she does not use drugs. She works in  Economist. Her son is a Pharmacist, hospital but is in school to become a Radio broadcast assistant.  She lives in [place].  The patient is alone today.  Allergies: No Known Allergies  Current Medications: Current Outpatient Medications  Medication Sig Dispense Refill  . Cyanocobalamin (VITAMIN B-12 PO) Take 2,500 mcg by mouth daily.     Marland Kitchen ELDERBERRY PO Take by mouth.    . loratadine (CLARITIN) 10 MG tablet Take 10 mg by mouth daily.    Marland Kitchen MELATONIN PO Take by mouth.    Marland Kitchen VITAMIN D, CHOLECALCIFEROL, PO Take 400 Units by mouth.     . fluconazole (DIFLUCAN) 150 MG tablet Take 1 tablet (150 mg total) by mouth daily. Take one  pill orally as needed (Patient not taking: Reported on 10/20/2019) 1 tablet 0   Current Facility-Administered Medications  Medication Dose Route Frequency Provider Last Rate Last Dose  . influenza vac split quadrivalent PF (FLUARIX) injection 0.5 mL  0.5 mL Intramuscular Once Lequita Asal, MD       Facility-Administered Medications Ordered in Other Visits  Medication Dose Route Frequency Provider Last Rate Last Dose  . heparin lock flush 100 unit/mL  500 Units Intravenous Once Lloyd Huger, MD      . heparin lock flush 100 unit/mL  500 Units Intravenous Once Lloyd Huger, MD      . heparin lock flush 100 unit/mL  500 Units Intracatheter Once PRN Lloyd Huger, MD      . sodium chloride 0.9 % injection 10 mL  10 mL Intracatheter PRN Lloyd Huger, MD   10 mL at 07/22/15 0956  . sodium chloride 0.9 % injection 10 mL  10 mL Intravenous PRN Lloyd Huger, MD   10 mL at 09/02/15 0941  . sodium chloride 0.9 % injection 10 mL  10 mL Intravenous PRN Lloyd Huger, MD      . sodium chloride 0.9 % injection 10 mL  10 mL Intracatheter PRN Lloyd Huger, MD   10 mL at 09/23/15 1205  . sodium chloride 0.9 % injection 10 mL  10 mL Intracatheter PRN Lloyd Huger, MD   10 mL at 09/30/15 0900    Review of Systems  Constitutional: Positive for weight loss (5 pounds). Negative for chills, diaphoresis and malaise/fatigue.       Feels fine.  HENT: Negative.  Negative for congestion, ear pain, hearing loss, nosebleeds and sore throat.   Eyes: Negative.  Negative for blurred vision, double vision and photophobia.  Respiratory: Negative.  Negative for cough, sputum production and shortness of breath.   Cardiovascular: Negative.  Negative for chest pain, palpitations, orthopnea, leg swelling and PND.  Gastrointestinal: Negative.  Negative for abdominal pain, blood in stool, constipation, diarrhea, heartburn, melena, nausea and vomiting.  Genitourinary:  Negative.  Negative for dysuria, frequency, hematuria and urgency.  Musculoskeletal: Negative.  Negative for back pain, joint pain and myalgias.  Skin: Negative.  Negative for itching and rash.  Neurological: Positive for sensory change (neuropathy, feet and legs). Negative for dizziness, tingling, focal weakness, weakness and headaches.  Endo/Heme/Allergies: Positive for environmental allergies (Claritin daily). Does not bruise/bleed easily.  Psychiatric/Behavioral: Negative for depression. The patient is nervous/anxious and has insomnia.   All other systems reviewed and are negative.  Performance status (ECOG): 1  Vitals Blood pressure (!) 141/85, pulse 68, temperature (!) 97.4 F (36.3 C), temperature source Tympanic, resp. rate 18, height '5\' 9"'  (1.753 m), weight 221 lb 9 oz (100.5 kg),  SpO2 98 %.  Physical Exam  Constitutional: She is oriented to person, place, and time. She appears well-developed and well-nourished. No distress.  HENT:  Head: Normocephalic and atraumatic.  Mouth/Throat: Oropharynx is clear and moist. No oropharyngeal exudate.  Shoulder length graying hair.   Mask.  Eyes: Pupils are equal, round, and reactive to light. Conjunctivae and EOM are normal. No scleral icterus.  Blue eyes.  Neck: Normal range of motion. No JVD present.  Cardiovascular: Normal rate, regular rhythm and normal heart sounds.  No murmur heard. Pulmonary/Chest: Effort normal and breath sounds normal. No respiratory distress. She has no wheezes. She has no rales. Right breast exhibits no inverted nipple, no mass, no nipple discharge, no skin change (fibrocystic changes) and no tenderness. Left breast exhibits no inverted nipple, no mass, no nipple discharge, no skin change (fibrocystic changes at 7 o'clock and 1 o'clock) and no tenderness.  LEFT breast scarring; telangectasias.   Abdominal: Soft. Bowel sounds are normal. She exhibits no distension. There is no abdominal tenderness. There is no  rebound and no guarding.  Musculoskeletal: Normal range of motion.        General: No tenderness or edema.  Lymphadenopathy:       Head (right side): No preauricular, no posterior auricular and no occipital adenopathy present.       Head (left side): No preauricular, no posterior auricular and no occipital adenopathy present.    She has no cervical adenopathy.    She has no axillary adenopathy.       Right: No inguinal and no supraclavicular adenopathy present.       Left: No inguinal and no supraclavicular adenopathy present.  Neurological: She is alert and oriented to person, place, and time.  Skin: Skin is warm and dry. No rash noted. She is not diaphoretic.  Psychiatric: She has a normal mood and affect. Her behavior is normal. Judgment and thought content normal.  Nursing note and vitals reviewed.   Appointment on 10/21/2019  Component Date Value Ref Range Status  . Sodium 10/21/2019 136  135 - 145 mmol/L Final  . Potassium 10/21/2019 4.3  3.5 - 5.1 mmol/L Final  . Chloride 10/21/2019 100  98 - 111 mmol/L Final  . CO2 10/21/2019 26  22 - 32 mmol/L Final  . Glucose, Bld 10/21/2019 107* 70 - 99 mg/dL Final  . BUN 10/21/2019 13  6 - 20 mg/dL Final  . Creatinine, Ser 10/21/2019 0.86  0.44 - 1.00 mg/dL Final  . Calcium 10/21/2019 8.9  8.9 - 10.3 mg/dL Final  . Total Protein 10/21/2019 7.2  6.5 - 8.1 g/dL Final  . Albumin 10/21/2019 4.0  3.5 - 5.0 g/dL Final  . AST 10/21/2019 25  15 - 41 U/L Final  . ALT 10/21/2019 23  0 - 44 U/L Final  . Alkaline Phosphatase 10/21/2019 76  38 - 126 U/L Final  . Total Bilirubin 10/21/2019 0.5  0.3 - 1.2 mg/dL Final  . GFR calc non Af Amer 10/21/2019 >60  >60 mL/min Final  . GFR calc Af Amer 10/21/2019 >60  >60 mL/min Final  . Anion gap 10/21/2019 10  5 - 15 Final   Performed at [facility], [address], [place], [place] [address]  . WBC 10/21/2019 6.3  4.0 - 10.5 K/uL Final  . RBC 10/21/2019 4.13  3.87 - 5.11 MIL/uL Final  .  Hemoglobin 10/21/2019 13.0  12.0 - 15.0 g/dL Final  . HCT 10/21/2019 39.8  36.0 - 46.0 %  Final  . MCV 10/21/2019 96.4  80.0 - 100.0 fL Final  . MCH 10/21/2019 31.5  26.0 - 34.0 pg Final  . MCHC 10/21/2019 32.7  30.0 - 36.0 g/dL Final  . RDW 10/21/2019 12.4  11.5 - 15.5 % Final  . Platelets 10/21/2019 287  150 - 400 K/uL Final  . nRBC 10/21/2019 0.0  0.0 - 0.2 % Final  . Neutrophils Relative % 10/21/2019 56  % Final  . Neutro Abs 10/21/2019 3.5  1.7 - 7.7 K/uL Final  . Lymphocytes Relative 10/21/2019 27  % Final  . Lymphs Abs 10/21/2019 1.7  0.7 - 4.0 K/uL Final  . Monocytes Relative 10/21/2019 11  % Final  . Monocytes Absolute 10/21/2019 0.7  0.1 - 1.0 K/uL Final  . Eosinophils Relative 10/21/2019 5  % Final  . Eosinophils Absolute 10/21/2019 0.3  0.0 - 0.5 K/uL Final  . Basophils Relative 10/21/2019 1  % Final  . Basophils Absolute 10/21/2019 0.1  0.0 - 0.1 K/uL Final  . Immature Granulocytes 10/21/2019 0  % Final  . Abs Immature Granulocytes 10/21/2019 0.02  0.00 - 0.07 K/uL Final   Performed at [facility], [address], [place], [place] [address]    Assessment:  Brittany Ryan is a 58 y.o. 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  Pathology revealed a 1.5 cm grade III invasive mammary carcinoma with medullary features.  There was no DCIS.  Margins were close (4 mm).  One sentinel lymph node was negative.  Tumor was ER negative, PR negative, HER2 negative by IHC.   Pathologic stage was T1cN0.  [facility] genetic testing was negative.  She received 4 cycles of Adriamycin and Cytoxan from 06/10/2015 - 08/05/2015.  She received weekly Taxol x 12 from 08/19/2015 - 11/07/2015.   She underwent 5000 cGy to her left breast with a scar boost of 1400 cGy from 12/15/2015 - 01/20/2016.   Bilateral diagnostic mammogram on 03/16/2019 revealed no  mammographic evidence of malignancy.  CA27.29 has been followed:  12.7 on 02/10/2016, 10.4 on 05/11/2016, 17.9 on 08/24/2016, 15.5 on 11/30/2016, 20.6 on 02/22/2017, 18.8 on 08/23/2017, and 19.5 on 04/20/2019.  She has B12 deficiency.  B12 was 269 on 04/20/2019.  She is on oral B12.  B12 was 451 on 05/19/2019.  Symptomatically, she feels "fine".  She has chronic neuropathy in her feet.    Plan: 1.   Labs today: CBC with diff, CMP, CA27.29. 2.   Stage IA triple negative left breast cancer             She is s/p lumpectomy and sentinel lymph node biopsy on 05/19/2015.  She received AC x 4 followed by weekly Taxol x 12 (last 11/07/2015).             Clinically, she is doing well without evidence of recurrent disease.             Schedule mammogram on 03/16/2020. 3.   Peripheral neuropathy             She has a grade II Taxol induced peripheral neuropathy.             Continue to monitor. 4.   B12 deficiency             B12 was 269 on 04/20/2019 and 451 on 05/19/2019.  Patient on oral B12 (patient to  confirm dose she is taking).  B12 goal 400.  Continue to monitor. 5.   Health maintenance  Influenza vaccine today. 6.   Insomnia  Renew Ambien 5 mg po qhs prn insomnia (dis: #30). 7.   RTC in 6 months for MD assessment and labs (CBC with diff, CMP, B12, CA27.29).  I discussed the assessment and treatment plan with the patient.  The patient was provided an opportunity to ask questions and all were answered.  The patient agreed with the plan and demonstrated an understanding of the instructions.  The patient was advised to call back if the symptoms worsen or if the condition fails to improve as anticipated.  I provided 16 minutes of face-to-face time during this this encounter and > 50% was spent counseling as documented under my assessment and plan.    Lequita Asal, MD, PhD    10/21/2019, 9:26 AM  I, Selena Batten, am acting as scribe for [employer]. Mike Gip, MD, PhD.  I,   C. Mike Gip, MD, have reviewed the above documentation for accuracy and completeness, and I agree with the above.

## 2019-10-20 ENCOUNTER — Encounter: Payer: Self-pay | Admitting: Hematology and Oncology

## 2019-10-20 ENCOUNTER — Other Ambulatory Visit: Payer: Self-pay

## 2019-10-20 NOTE — Progress Notes (Signed)
Patient states that Dr Mike Gip prescribed Ambien but when the patient went to pick it up there was no prescription at the pharmacy. Patient states she would still like this prescription if possible. She also states that her feet and legs hurt in the morning very bad and she would like a prescription for gabapentin as well. Patient is taking Vitamin D, Vitamin B12 and elderberry. She states she does not know the dosage so I advised her to bring the bottles or a picture of them to her appointment tomorrow.

## 2019-10-21 ENCOUNTER — Encounter: Payer: Self-pay | Admitting: Hematology and Oncology

## 2019-10-21 ENCOUNTER — Inpatient Hospital Stay (HOSPITAL_BASED_OUTPATIENT_CLINIC_OR_DEPARTMENT_OTHER): Payer: BLUE CROSS/BLUE SHIELD | Admitting: Hematology and Oncology

## 2019-10-21 ENCOUNTER — Inpatient Hospital Stay: Payer: BLUE CROSS/BLUE SHIELD

## 2019-10-21 ENCOUNTER — Inpatient Hospital Stay: Payer: BLUE CROSS/BLUE SHIELD | Attending: Hematology and Oncology

## 2019-10-21 VITALS — BP 141/85 | HR 68 | Temp 97.4°F | Resp 18 | Ht 69.0 in | Wt 221.6 lb

## 2019-10-21 DIAGNOSIS — Z803 Family history of malignant neoplasm of breast: Secondary | ICD-10-CM | POA: Insufficient documentation

## 2019-10-21 DIAGNOSIS — Z23 Encounter for immunization: Secondary | ICD-10-CM | POA: Insufficient documentation

## 2019-10-21 DIAGNOSIS — Z809 Family history of malignant neoplasm, unspecified: Secondary | ICD-10-CM | POA: Insufficient documentation

## 2019-10-21 DIAGNOSIS — Z299 Encounter for prophylactic measures, unspecified: Secondary | ICD-10-CM | POA: Diagnosis not present

## 2019-10-21 DIAGNOSIS — Z8249 Family history of ischemic heart disease and other diseases of the circulatory system: Secondary | ICD-10-CM | POA: Insufficient documentation

## 2019-10-21 DIAGNOSIS — G4700 Insomnia, unspecified: Secondary | ICD-10-CM | POA: Diagnosis not present

## 2019-10-21 DIAGNOSIS — E538 Deficiency of other specified B group vitamins: Secondary | ICD-10-CM | POA: Diagnosis not present

## 2019-10-21 DIAGNOSIS — M1990 Unspecified osteoarthritis, unspecified site: Secondary | ICD-10-CM | POA: Diagnosis not present

## 2019-10-21 DIAGNOSIS — C50412 Malignant neoplasm of upper-outer quadrant of left female breast: Secondary | ICD-10-CM | POA: Diagnosis not present

## 2019-10-21 DIAGNOSIS — Z79899 Other long term (current) drug therapy: Secondary | ICD-10-CM | POA: Diagnosis not present

## 2019-10-21 DIAGNOSIS — G629 Polyneuropathy, unspecified: Secondary | ICD-10-CM | POA: Insufficient documentation

## 2019-10-21 DIAGNOSIS — R200 Anesthesia of skin: Secondary | ICD-10-CM | POA: Diagnosis not present

## 2019-10-21 DIAGNOSIS — Z171 Estrogen receptor negative status [ER-]: Secondary | ICD-10-CM | POA: Diagnosis not present

## 2019-10-21 LAB — COMPREHENSIVE METABOLIC PANEL
ALT: 23 U/L (ref 0–44)
AST: 25 U/L (ref 15–41)
Albumin: 4 g/dL (ref 3.5–5.0)
Alkaline Phosphatase: 76 U/L (ref 38–126)
Anion gap: 10 (ref 5–15)
BUN: 13 mg/dL (ref 6–20)
CO2: 26 mmol/L (ref 22–32)
Calcium: 8.9 mg/dL (ref 8.9–10.3)
Chloride: 100 mmol/L (ref 98–111)
Creatinine, Ser: 0.86 mg/dL (ref 0.44–1.00)
GFR calc Af Amer: >60 mL/min (ref >60)
GFR calc non Af Amer: >60 mL/min (ref >60)
Glucose, Bld: 107 mg/dL — ABNORMAL HIGH (ref 70–99)
Potassium: 4.3 mmol/L (ref 3.5–5.1)
Sodium: 136 mmol/L (ref 135–145)
Total Bilirubin: 0.5 mg/dL (ref 0.3–1.2)
Total Protein: 7.2 g/dL (ref 6.5–8.1)

## 2019-10-21 LAB — CBC WITH DIFFERENTIAL/PLATELET
Abs Immature Granulocytes: 0.02 10*3/uL (ref 0.00–0.07)
Basophils Absolute: 0.1 10*3/uL (ref 0.0–0.1)
Basophils Relative: 1 %
Eosinophils Absolute: 0.3 10*3/uL (ref 0.0–0.5)
Eosinophils Relative: 5 %
HCT: 39.8 % (ref 36.0–46.0)
Hemoglobin: 13 g/dL (ref 12.0–15.0)
Immature Granulocytes: 0 %
Lymphocytes Relative: 27 %
Lymphs Abs: 1.7 10*3/uL (ref 0.7–4.0)
MCH: 31.5 pg (ref 26.0–34.0)
MCHC: 32.7 g/dL (ref 30.0–36.0)
MCV: 96.4 fL (ref 80.0–100.0)
Monocytes Absolute: 0.7 10*3/uL (ref 0.1–1.0)
Monocytes Relative: 11 %
Neutro Abs: 3.5 10*3/uL (ref 1.7–7.7)
Neutrophils Relative %: 56 %
Platelets: 287 10*3/uL (ref 150–400)
RBC: 4.13 MIL/uL (ref 3.87–5.11)
RDW: 12.4 % (ref 11.5–15.5)
WBC: 6.3 10*3/uL (ref 4.0–10.5)
nRBC: 0 % (ref 0.0–0.2)

## 2019-10-21 MED ORDER — ZOLPIDEM TARTRATE 5 MG PO TABS: 5.0000 mg | ORAL_TABLET | Freq: Every evening | ORAL | 0 refills | Status: DC | PRN

## 2019-10-21 MED ORDER — INFLUENZA VAC SPLIT QUAD 0.5 ML IM SUSY
0.5000 mL | PREFILLED_SYRINGE | Freq: Once | INTRAMUSCULAR | Status: AC
  Administered 2019-10-21: 0.5 mL via INTRAMUSCULAR

## 2019-11-02 ENCOUNTER — Telehealth: Payer: Self-pay

## 2019-11-02 NOTE — Telephone Encounter (Signed)
New patient referral sent to Dr Perrin Smack office. Fax conformation confirmed.

## 2019-12-17 ENCOUNTER — Other Ambulatory Visit: Payer: Self-pay

## 2019-12-18 ENCOUNTER — Telehealth: Payer: Self-pay | Admitting: *Deleted

## 2019-12-18 MED ORDER — GABAPENTIN 300 MG PO CAPS: 300.0000 mg | ORAL_CAPSULE | Freq: Every day | ORAL | 1 refills | Status: DC

## 2019-12-18 NOTE — Telephone Encounter (Signed)
Spoke with patient, instructed that Gabapentin was being sent to [facility], [place]. Pt verbalized understanding.

## 2020-01-01 ENCOUNTER — Telehealth: Payer: Self-pay

## 2020-01-01 ENCOUNTER — Other Ambulatory Visit: Payer: Self-pay | Admitting: Hematology and Oncology

## 2020-01-01 DIAGNOSIS — G629 Polyneuropathy, unspecified: Secondary | ICD-10-CM

## 2020-01-01 NOTE — Telephone Encounter (Signed)
-----   Message from Lequita Asal, MD sent at 01/01/2020  6:55 AM EST ----- Regarding: RE: numbness Contact: [phone]  Please call patient.    Does she have back pain or lower extremity weakness?  In 10/2019 she described a stable neuropathy in her feet and legs. Have her symptoms changed significantly? Does she need to be seen today for consideration of STAT imaging?  A consult was ordered for Dr Melrose Nakayama.  Please ensure his office is aware of the consult as Shirlean Mylar is not in today.  M  ----- Message ----- From: Secundino Ginger Sent: 12/31/2019   3:04 PM EST To: Arlan Organ, RN, # Subject: numbness                                       Her legs and numb up to her thighs. She is taking gabapentin has helped. She said you mentioned a neurologist tp her but there is no referral in. Please call patient.

## 2020-01-01 NOTE — Telephone Encounter (Signed)
Spoke with the patient  to inform her that i have sent her information to Dr Perrin Smack office for her neuropathy to bilateral feet. The patient states she is having increase pain noted to bilateral feet. There was a new patient information referral sent to Dr Perrin Smack office in 12 / 2020 and the patient did not want to be schedule at that time. I have informed the patient that I have resent her information to Dr Perrin Smack office and they should be reaching back out to her soon. The patient states she will wait on the referral and not be seen today. The patient was understanding and agreeable.

## 2020-02-10 ENCOUNTER — Other Ambulatory Visit: Payer: Self-pay | Admitting: Hematology and Oncology

## 2020-03-21 ENCOUNTER — Ambulatory Visit
Admission: RE | Admit: 2020-03-21 | Discharge: 2020-03-21 | Disposition: A | Discharge status: Home / Self Care | Payer: BLUE CROSS/BLUE SHIELD | Source: Ambulatory Visit | Attending: Hematology and Oncology | Admitting: Hematology and Oncology

## 2020-03-21 DIAGNOSIS — C50412 Malignant neoplasm of upper-outer quadrant of left female breast: Secondary | ICD-10-CM | POA: Insufficient documentation

## 2020-04-08 ENCOUNTER — Other Ambulatory Visit: Payer: Self-pay | Admitting: Oncology

## 2020-04-15 NOTE — Progress Notes (Signed)
[facility]  [address] [place], [place] [address] Phone: [phone]  Fax: [phone]   Clinic Day:  04/20/2020  Referring physician: [name], NP  Chief Complaint: Brittany Ryan is a 59 y.o. 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   HPI: The patient was last seen in the medical oncology clinic on 10/21/2019. At that time, she felt "fine". She had chronic neuropathy in her feet. Hematocrit was 39.8, hemoglobin 13.0, MCV 96.4, platelets 287,000, WBC 6300, ANC 3500. She received the influenza vaccine.  Annual diagnostic mammogram on 03/21/2020 revealed a stable left breast lumpectomy site. There was no mammographic evidence of malignancy in the bilateral breasts.  During the interim, she is doing well today. She says the last six months have been fine except the neuropathy in her legs and feet. She can stick a pin in her leg and not feel it. She feels like she needs to lose weight but has been struggling to do so. She says she does not drink much caffeine except her one cup of coffee in the morning. She takes one 1200 mcg of B-12 every day; she splits the pill in half because of the high dosage.   She received her first COVID-19 vaccine on 04/15/2020. Her second one is scheduled for 05/12/2020.    Past Medical History:  Diagnosis Date  . Arthritis   . Breast cancer ([place]) 2016   stage IA triple negative invasive mammary carcinoma of the upper outer quadrant of the left breast s/p left lumpectomy and sentinel lymph node biopsy on 05/19/2015.  Pathology revealed a 1.5 cm grade III invasive mammary carcinoma with medullary features.  There was no DCIS.  Margins were close (4 mm).  One sentinel lymph node was negative.  Tumor was ER negative, PR negative, HER2 negative by IHC  . Frequent sinus infections   . History of chemotherapy 2016   BREAST CA  .  Hypertension   . Neuropathy   . Personal history of chemotherapy 2016   left breast ca  . Personal history of radiation therapy 2016   left breast ca  . Radiation 2016-17   BREAST CA  . Seasonal allergies     Past Surgical History:  Procedure Laterality Date  . BREAST BIOPSY Left 04/27/2015   [facility] with medullary features.  Marland Kitchen BREAST LUMPECTOMY WITH NEEDLE LOCALIZATION Left 05/19/2015   Procedure: BREAST LUMPECTOMY WITH NEEDLE LOCALIZATION;  Surgeon: Florene Glen, MD;  Location: [facility] ORS;  Service: General;  Laterality: Left;  . BREAST LUMPECTOMY WITH SENTINEL LYMPH NODE BIOPSY Left 05/19/2015   Procedure: BREAST LUMPECTOMY WITH SENTINEL LYMPH NODE BX;  Surgeon: Florene Glen, MD;  Location: [facility] ORS;  Service: General;  Laterality: Left;  Marland Kitchen MASTECTOMY, PARTIAL Left 05/19/2015   Procedure: MASTECTOMY PARTIAL;  Surgeon: Florene Glen, MD;  Location: [facility] ORS;  Service: General;  Laterality: Left;  . PORT-A-CATH REMOVAL Left 04/16/2017   Procedure: REMOVAL PORT-A-CATH;  Surgeon: Florene Glen, MD;  Location: [facility] ORS;  Service: General;  Laterality: Left;  . PORTACATH PLACEMENT Left 05/19/2015   Procedure: INSERTION PORT-A-CATH;  Surgeon: Florene Glen, MD;  Location: [facility] OR[facility];  Service: General;  Laterality: Left;    Family History  Problem Relation Age of Onset  . Cancer Father   . Hypertension Father   . CAD Father   . Heart attack Father   .  Hypertension Mother   . Breast cancer Sister 95  . Breast cancer Paternal Aunt     Social History:  reports that she has never smoked. She has never used smokeless tobacco. She reports current alcohol use. She reports that she does not use drugs. She works in [employer]. Her son is a Pharmacist, hospital but is in school to become a Radio broadcast assistant.  She lives in [place]. The patient is alone today.   Allergies: No Known Allergies  Current Medications: Current Outpatient Medications  Medication Sig Dispense Refill  . Cyanocobalamin  (VITAMIN B-12 PO) Take 2,500 mcg by mouth daily.     Marland Kitchen ELDERBERRY PO Take by mouth.    . gabapentin (NEURONTIN) 300 MG capsule TAKE 1 CAPSULE(300 MG) BY MOUTH AT BEDTIME 30 capsule 1  . loratadine (CLARITIN) 10 MG tablet Take 10 mg by mouth daily.    Marland Kitchen MELATONIN PO Take by mouth.    Marland Kitchen VITAMIN D, CHOLECALCIFEROL, PO Take 400 Units by mouth.     . zolpidem (AMBIEN) 5 MG tablet Take 1 tablet (5 mg total) by mouth at bedtime as needed. for sleep (Patient not taking: Reported on 04/19/2020) 30 tablet 0   No current facility-administered medications for this visit.   Facility-Administered Medications Ordered in Other Visits  Medication Dose Route Frequency Provider Last Rate Last Admin  . heparin lock flush 100 unit/mL  500 Units Intravenous Once Lloyd Huger, MD      . heparin lock flush 100 unit/mL  500 Units Intravenous Once Lloyd Huger, MD      . heparin lock flush 100 unit/mL  500 Units Intracatheter Once PRN Lloyd Huger, MD      . sodium chloride 0.9 % injection 10 mL  10 mL Intracatheter PRN Lloyd Huger, MD   10 mL at 07/22/15 0956  . sodium chloride 0.9 % injection 10 mL  10 mL Intravenous PRN Lloyd Huger, MD   10 mL at 09/02/15 0941  . sodium chloride 0.9 % injection 10 mL  10 mL Intravenous PRN Lloyd Huger, MD      . sodium chloride 0.9 % injection 10 mL  10 mL Intracatheter PRN Lloyd Huger, MD   10 mL at 09/23/15 1205  . sodium chloride 0.9 % injection 10 mL  10 mL Intracatheter PRN Lloyd Huger, MD   10 mL at 09/30/15 0900    Review of Systems  Constitutional: Negative for chills, diaphoresis, fever, malaise/fatigue and weight loss (up 8 lb).       Feels fine besides the neuropathy in her legs.  HENT: Negative.  Negative for congestion, ear pain, hearing loss, nosebleeds, sore throat and tinnitus.   Eyes: Negative.  Negative for blurred vision, double vision and photophobia.  Respiratory: Negative.  Negative for cough,  sputum production and shortness of breath.   Cardiovascular: Negative.  Negative for chest pain, palpitations, orthopnea, leg swelling and PND.  Gastrointestinal: Negative.  Negative for abdominal pain, blood in stool, constipation, diarrhea, heartburn, melena, nausea and vomiting.  Genitourinary: Negative.  Negative for dysuria, frequency, hematuria and urgency.  Musculoskeletal: Negative.  Negative for back pain, joint pain, myalgias and neck pain.  Skin: Negative.  Negative for itching and rash.  Neurological: Positive for sensory change (neuropathy, feet and legs). Negative for dizziness, tingling, focal weakness, weakness and headaches.  Endo/Heme/Allergies: Positive for environmental allergies (Claritin daily). Does not bruise/bleed easily.  Psychiatric/Behavioral: Negative for depression. The patient is nervous/anxious and has insomnia.  All other systems reviewed and are negative.  Performance status (ECOG): 1 - Symptomatic but completely ambulatory  Vitals Blood pressure (!) 155/94, pulse 64, temperature (!) 97 F (36.1 C), temperature source Tympanic, weight 229 lb 2.7 oz (103.9 kg), SpO2 97 %.  Physical Exam  Constitutional: She is oriented to person, place, and time. She appears well-developed and well-nourished. No distress.  HENT:  Head: Normocephalic and atraumatic.  Mouth/Throat: Oropharynx is clear and moist. No oropharyngeal exudate.  Shoulder length graying hair.   Eyes: Pupils are equal, round, and reactive to light. Conjunctivae and EOM are normal. Right eye exhibits no discharge. Left eye exhibits no discharge. No scleral icterus.  Blue eyes.  Neck: No JVD present.  Cardiovascular: Normal rate, regular rhythm and normal heart sounds. Exam reveals no gallop and no friction rub.  No murmur heard. Pulmonary/Chest: Effort normal. No respiratory distress. She has no wheezes. She has no rales. Right breast exhibits no inverted nipple, no mass, no nipple discharge, no skin  change (fibrocystic changes) and no tenderness. Left breast exhibits tenderness. Left breast exhibits no inverted nipple, no mass, no nipple discharge and no skin change.  LEFT breast scarring; telangectasias.   Abdominal: Soft. Bowel sounds are normal. She exhibits no distension and no mass. There is no abdominal tenderness. There is no rebound and no guarding.  Musculoskeletal:        General: No tenderness or edema. Normal range of motion.     Cervical back: Normal range of motion and neck supple.  Lymphadenopathy:       Head (right side): No preauricular, no posterior auricular and no occipital adenopathy present.       Head (left side): No preauricular, no posterior auricular and no occipital adenopathy present.    She has no cervical adenopathy.    She has no axillary adenopathy.       Right: No supraclavicular adenopathy present.       Left: No supraclavicular adenopathy present.  Neurological: She is alert and oriented to person, place, and time.  Skin: Skin is warm and dry. No rash noted. She is not diaphoretic. No erythema. No pallor.  Psychiatric: She has a normal mood and affect. Her behavior is normal. Judgment and thought content normal.  Nursing note and vitals reviewed.   Appointment on 04/20/2020  Component Date Value Ref Range Status  . Sodium 04/20/2020 136  135 - 145 mmol/L Final  . Potassium 04/20/2020 4.1  3.5 - 5.1 mmol/L Final  . Chloride 04/20/2020 101  98 - 111 mmol/L Final  . CO2 04/20/2020 26  22 - 32 mmol/L Final  . Glucose, Bld 04/20/2020 108* 70 - 99 mg/dL Final   Glucose reference range applies only to samples taken after fasting for at least 8 hours.  . BUN 04/20/2020 15  6 - 20 mg/dL Final  . Creatinine, Ser 04/20/2020 0.83  0.44 - 1.00 mg/dL Final  . Calcium 04/20/2020 8.9  8.9 - 10.3 mg/dL Final  . Total Protein 04/20/2020 7.2  6.5 - 8.1 g/dL Final  . Albumin 04/20/2020 4.0  3.5 - 5.0 g/dL Final  . AST 04/20/2020 25  15 - 41 U/L Final  . ALT  04/20/2020 23  0 - 44 U/L Final  . Alkaline Phosphatase 04/20/2020 69  38 - 126 U/L Final  . Total Bilirubin 04/20/2020 0.7  0.3 - 1.2 mg/dL Final  . GFR calc non Af Amer 04/20/2020 >60  >60 mL/min Final  . GFR calc Af Wyvonnia Lora  04/20/2020 >60  >60 mL/min Final  . Anion gap 04/20/2020 9  5 - 15 Final   Performed at [facility], [address] [place], [place] [address]  . WBC 04/20/2020 5.7  4.0 - 10.5 K/uL Final  . RBC 04/20/2020 4.21  3.87 - 5.11 MIL/uL Final  . Hemoglobin 04/20/2020 13.2  12.0 - 15.0 g/dL Final  . HCT 04/20/2020 39.6  36.0 - 46.0 % Final  . MCV 04/20/2020 94.1  80.0 - 100.0 fL Final  . MCH 04/20/2020 31.4  26.0 - 34.0 pg Final  . MCHC 04/20/2020 33.3  30.0 - 36.0 g/dL Final  . RDW 04/20/2020 12.3  11.5 - 15.5 % Final  . Platelets 04/20/2020 272  150 - 400 K/uL Final  . nRBC 04/20/2020 0.0  0.0 - 0.2 % Final  . Neutrophils Relative % 04/20/2020 53  % Final  . Neutro Abs 04/20/2020 3.1  1.7 - 7.7 K/uL Final  . Lymphocytes Relative 04/20/2020 28  % Final  . Lymphs Abs 04/20/2020 1.6  0.7 - 4.0 K/uL Final  . Monocytes Relative 04/20/2020 10  % Final  . Monocytes Absolute 04/20/2020 0.6  0.1 - 1.0 K/uL Final  . Eosinophils Relative 04/20/2020 7  % Final  . Eosinophils Absolute 04/20/2020 0.4  0.0 - 0.5 K/uL Final  . Basophils Relative 04/20/2020 2  % Final  . Basophils Absolute 04/20/2020 0.1  0.0 - 0.1 K/uL Final  . Immature Granulocytes 04/20/2020 0  % Final  . Abs Immature Granulocytes 04/20/2020 0.01  0.00 - 0.07 K/uL Final   Performed at [facility], [address] [place], [place] [address]    Assessment:  Brittany Ryan is a 59 y.o. 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  Pathology revealed a 1.5 cm grade III invasive mammary carcinoma with medullary features.  There was no DCIS.  Margins were close (4  mm).  One sentinel lymph node was negative.  Tumor was ER negative, PR negative, HER2 negative by IHC.   Pathologic stage was T1cN0.  [facility] genetic testing was negative.  She received 4 cycles of Adriamycin and Cytoxan from 06/10/2015 - 08/05/2015.  She received weekly Taxol x 12 from 08/19/2015 - 11/07/2015.   She underwent 5000 cGy to her left breast with a scar boost of 1400 cGy from 12/15/2015 - 01/20/2016.   Bilateral diagnostic mammogram on 03/21/2020 revealed a stable left breast lumpectomy site. There was no mammographic evidence of malignancy in the bilateral breasts.  CA27.29 has been followed:  12.7 on 02/10/2016, 10.4 on 05/11/2016, 17.9 on 08/24/2016, 15.5 on 11/30/2016, 20.6 on 02/22/2017, 18.8 on 08/23/2017, 19.5 on 04/20/2019, and 16.5 on 04/20/2020.  She has B12 deficiency.  B12 was 269 on 04/20/2019.  She is on oral B12.  B12 was 451 on 05/19/2019.  She received her first COVID-19 vaccine on 04/15/2020. Her second one is scheduled for 05/12/2020.   Symptomatically, she is doing well.  She denies any breast concerns.  She has ongoing neuropathy.  Exam is stable.  Plan: 1.   Labs today: CBC with diff, CMP, CA27.29. 2.   Stage IA triple negative left breast cancer             She is s/p lumpectomy and sentinel lymph node biopsy on 05/19/2015.  She received AC x 4 followed by weekly Taxol x 12 (last  11/07/2015).             Clinically, she is doing well.             Exam reveals no evidence of recurrent disease.    Bilateral diagnostic mammogram on 03/21/2020 revealed no evidence of disease.  CA27-29 is normal. 3.   Peripheral neuropathy             She has a grade II Taxol induced peripheral neuropathy.  Discuss follow-up with Dr. Melrose Nakayama or Dr. Manuella Ghazi in [place]             Continue to monitor. 4.   B12 deficiency             B12 was 269 on 04/20/2019 and 1029 today.  She takes B12 every other day.  B12 goal 400.  Check folate annually. 5.   Insomnia  Refill  Ambien 5 mg po qhs prn insomnia (dis: #30). 6.   Add labs: TSH, free T4. 7.   RN to call patient with B12 level. 8.   RTC in 6 months for MD assess and labs (CBC with diff, CMP, CA27.29).  I discussed the assessment and treatment plan with the patient.  The patient was provided an opportunity to ask questions and all were answered.  The patient agreed with the plan and demonstrated an understanding of the instructions.  The patient was advised to call back if the symptoms worsen or if the condition fails to improve as anticipated.   Lequita Asal, MD, PhD    04/20/2020, 8:52 AM  I, Heywood Footman, am acting as a Education administrator for Lequita Asal, MD.  I, [place] Mike Gip, MD, have reviewed the above documentation for accuracy and completeness, and I agree with the above.

## 2020-04-19 ENCOUNTER — Other Ambulatory Visit: Payer: Self-pay

## 2020-04-19 ENCOUNTER — Encounter: Payer: Self-pay | Admitting: Hematology and Oncology

## 2020-04-19 DIAGNOSIS — C50919 Malignant neoplasm of unspecified site of unspecified female breast: Secondary | ICD-10-CM | POA: Insufficient documentation

## 2020-04-19 NOTE — Progress Notes (Signed)
Patient pre screened for office appointment, no questions or concerns today. Patient reminded of upcoming appointment time and date. Patient request refill for Ambien states melatonin is ineffective.

## 2020-04-20 ENCOUNTER — Telehealth: Payer: Self-pay

## 2020-04-20 ENCOUNTER — Inpatient Hospital Stay: Payer: BLUE CROSS/BLUE SHIELD

## 2020-04-20 ENCOUNTER — Inpatient Hospital Stay: Payer: BLUE CROSS/BLUE SHIELD | Attending: Hematology and Oncology | Admitting: Hematology and Oncology

## 2020-04-20 ENCOUNTER — Encounter: Payer: Self-pay | Admitting: Hematology and Oncology

## 2020-04-20 ENCOUNTER — Other Ambulatory Visit: Payer: Self-pay

## 2020-04-20 VITALS — BP 155/94 | HR 64 | Temp 97.0°F | Wt 229.2 lb

## 2020-04-20 DIAGNOSIS — E538 Deficiency of other specified B group vitamins: Secondary | ICD-10-CM

## 2020-04-20 DIAGNOSIS — Z171 Estrogen receptor negative status [ER-]: Secondary | ICD-10-CM | POA: Diagnosis not present

## 2020-04-20 DIAGNOSIS — C50412 Malignant neoplasm of upper-outer quadrant of left female breast: Secondary | ICD-10-CM | POA: Insufficient documentation

## 2020-04-20 DIAGNOSIS — R635 Abnormal weight gain: Secondary | ICD-10-CM

## 2020-04-20 DIAGNOSIS — G620 Drug-induced polyneuropathy: Secondary | ICD-10-CM | POA: Diagnosis not present

## 2020-04-20 DIAGNOSIS — T451X5A Adverse effect of antineoplastic and immunosuppressive drugs, initial encounter: Secondary | ICD-10-CM

## 2020-04-20 DIAGNOSIS — G629 Polyneuropathy, unspecified: Secondary | ICD-10-CM | POA: Insufficient documentation

## 2020-04-20 DIAGNOSIS — G4700 Insomnia, unspecified: Secondary | ICD-10-CM

## 2020-04-20 LAB — CBC WITH DIFFERENTIAL/PLATELET
Abs Immature Granulocytes: 0.01 10*3/uL (ref 0.00–0.07)
Basophils Absolute: 0.1 10*3/uL (ref 0.0–0.1)
Basophils Relative: 2 %
Eosinophils Absolute: 0.4 10*3/uL (ref 0.0–0.5)
Eosinophils Relative: 7 %
HCT: 39.6 % (ref 36.0–46.0)
Hemoglobin: 13.2 g/dL (ref 12.0–15.0)
Immature Granulocytes: 0 %
Lymphocytes Relative: 28 %
Lymphs Abs: 1.6 10*3/uL (ref 0.7–4.0)
MCH: 31.4 pg (ref 26.0–34.0)
MCHC: 33.3 g/dL (ref 30.0–36.0)
MCV: 94.1 fL (ref 80.0–100.0)
Monocytes Absolute: 0.6 10*3/uL (ref 0.1–1.0)
Monocytes Relative: 10 %
Neutro Abs: 3.1 10*3/uL (ref 1.7–7.7)
Neutrophils Relative %: 53 %
Platelets: 272 10*3/uL (ref 150–400)
RBC: 4.21 MIL/uL (ref 3.87–5.11)
RDW: 12.3 % (ref 11.5–15.5)
WBC: 5.7 10*3/uL (ref 4.0–10.5)
nRBC: 0 % (ref 0.0–0.2)

## 2020-04-20 LAB — COMPREHENSIVE METABOLIC PANEL
ALT: 23 U/L (ref 0–44)
AST: 25 U/L (ref 15–41)
Albumin: 4 g/dL (ref 3.5–5.0)
Alkaline Phosphatase: 69 U/L (ref 38–126)
Anion gap: 9 (ref 5–15)
BUN: 15 mg/dL (ref 6–20)
CO2: 26 mmol/L (ref 22–32)
Calcium: 8.9 mg/dL (ref 8.9–10.3)
Chloride: 101 mmol/L (ref 98–111)
Creatinine, Ser: 0.83 mg/dL (ref 0.44–1.00)
GFR calc Af Amer: >60 mL/min (ref >60)
GFR calc non Af Amer: >60 mL/min (ref >60)
Glucose, Bld: 108 mg/dL — ABNORMAL HIGH (ref 70–99)
Potassium: 4.1 mmol/L (ref 3.5–5.1)
Sodium: 136 mmol/L (ref 135–145)
Total Bilirubin: 0.7 mg/dL (ref 0.3–1.2)
Total Protein: 7.2 g/dL (ref 6.5–8.1)

## 2020-04-20 LAB — VITAMIN B12: Vitamin B-12: 1029 pg/mL — ABNORMAL HIGH (ref 180–914)

## 2020-04-20 LAB — TSH: TSH: 5.432 u[IU]/mL — ABNORMAL HIGH (ref 0.350–4.500)

## 2020-04-20 LAB — T4, FREE: Free T4: 0.64 ng/dL (ref 0.61–1.12)

## 2020-04-20 MED ORDER — ZOLPIDEM TARTRATE 5 MG PO TABS: 5.0000 mg | ORAL_TABLET | Freq: Every evening | ORAL | 0 refills | Status: DC | PRN

## 2020-04-20 NOTE — Patient Instructions (Signed)
  Neurologist- Dr Melrose Nakayama or Dr Manuella Ghazi in [employer]

## 2020-04-20 NOTE — Telephone Encounter (Signed)
Patient aware. She states she is in the process of getting a new PCP because hers is no longer in network. Advised patient to  Take 1/4 of B12 pill everyday or 1/2 MWF. Faxed results to old PCP. Patient understanding

## 2020-04-20 NOTE — Telephone Encounter (Signed)
-----   Message from Lequita Asal, MD sent at 04/20/2020  1:09 PM EDT ----- Regarding: Please forward lab to PCP and notify patient  ----- Message ----- From: Interface, Lab In [place] Sent: 04/20/2020  11:56 AM EDT To: Lequita Asal, MD

## 2020-04-21 LAB — CANCER ANTIGEN 27.29: CA 27.29: 16.5 U/mL (ref 0.0–38.6)

## 2020-06-07 ENCOUNTER — Other Ambulatory Visit: Payer: Self-pay | Admitting: Hematology and Oncology

## 2020-10-19 NOTE — Progress Notes (Signed)
[facility]  [address], [address] [place], [place] [address] Phone: [phone]  Fax: [phone]   Clinic Day:  10/20/2020  Referring physician: [name], NP  Chief Complaint: Brittany Ryan is a 59 y.o. 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   HPI: The patient was last seen in the medical oncology clinic on 04/20/2020. At that time, she was doing well.  She denied any breast concerns.  She had ongoing neuropathy.  Exam was stable.  Mammogram revealed no evidence of recurrent disease.  Hematocrit was 39.6, hemoglobin 13.2, MCV 94.1, platelets 272,000, WBC 5,700. CMP was normal. CA27.29 was 16.5. Vitamin B12 was 1,029. TSH was 5.432 and free T4 was 0.64.  She continued oral B12 every other day.    During the interim, she has been "good". She states that her legs are numb from the knees down. She stopped taking gabapentin and is seeing neurology in 11/2020. She states that she has a tooth problem that gives her headaches but she is seeing her dentist next week. She also reports allergies. She denies bone or joint problems.   The patient reports that she still has significant insomnia. She was prescribed Amitriptyline and Trazodone but they make her dizzy. She would like Ambien.   She performs monthly breast exams. She reports some knots from scar tissue.  She would like to get her flu shot today. She is going to pick up some vitamin B12 at the pharmacy today. She just started taking vitamin D and calcium.   Past Medical History:  Diagnosis Date  . Arthritis   . Breast cancer ([place]) 2016   stage IA triple negative invasive mammary carcinoma of the upper outer quadrant of the left breast s/p left lumpectomy and sentinel lymph node biopsy on 05/19/2015.  Pathology revealed a 1.5 cm grade III invasive mammary carcinoma with medullary features.  There was no DCIS.  Margins were close (4 mm).  One sentinel lymph  node was negative.  Tumor was ER negative, PR negative, HER2 negative by IHC  . Frequent sinus infections   . History of chemotherapy 2016   BREAST CA  . Hypertension   . Neuropathy   . Personal history of chemotherapy 2016   left breast ca  . Personal history of radiation therapy 2016   left breast ca  . Radiation 2016-17   BREAST CA  . Seasonal allergies     Past Surgical History:  Procedure Laterality Date  . BREAST BIOPSY Left [facility]/27/2015   [facility] with med[facility] features.  Marland Kitchen BREAST LUMPECTOMY WITH NEEDLE LOCALIZATION Left 05/19/2015   Procedure: BREAST LUMPECTOMY WITH NEEDLE LOCALIZATION;  Surgeon: Florene Glen, MD;  Location: [facility];  Service: General;  Laterality: Left;  . BREAST LUMPECTOMY WITH SENTINEL LYMPH NODE BIOPSY Left 05/19/2015   Procedure: BREAST LUMPECTOMY WITH SENTINEL LYMPH NODE BX;  Surgeon: Florene Glen, MD;  Location: [facility];  Service: General;  Laterality: Left;  Marland Kitchen MASTECTOMY, PARTIAL Left 05/19/2015   Procedure: MASTECTOMY PARTIAL;  Surgeon: Florene Glen, MD;  Location: [facility];  Service: General;  Laterality: Left;  . PORT-A-CATH REMOVAL Left 04/16/2017   Procedure: REMOVAL PORT-A-CATH;  Surgeon: Florene Glen, MD;  Location: [facility];  Service: General;  Laterality: Left;  . PORTACATH PLACEMENT Left 05/19/2015   Procedure: INSERTION PORT-A-CATH;  Surgeon: Florene Glen, MD;  Location: [facility];  Service: General;  Laterality: Left;    Family  History  Problem Relation Age of Onset  . Cancer Father   . Hypertension Father   . CAD Father   . Heart attack Father   . Hypertension Mother   . Breast cancer Sister 54  . Breast cancer Paternal Aunt     Social History:  reports that she has never smoked. She has never used smokeless tobacco. She reports current alcohol use. She reports that she does not use drugs. She works in [employer]. Her son is a Pharmacist, hospital but is in school to become a Radio broadcast assistant. She is in a real estate class. She lives  in [place]. The patient is alone today.   Allergies: No Known Allergies  Current Medications: Current Outpatient Medications  Medication Sig Dispense Refill  . Cyanocobalamin (VITAMIN B-12 PO) Take 2,500 mcg by mouth daily.     Marland Kitchen gabapentin (NEURONTIN) 300 MG capsule TAKE 1 CAPSULE(300 MG) BY MOUTH AT BEDTIME 30 capsule 1  . loratadine (CLARITIN) 10 MG tablet Take 10 mg by mouth daily.    Marland Kitchen MELATONIN PO Take by mouth.    Marland Kitchen VITAMIN D, CHOLECALCIFEROL, PO Take 400 Units by mouth.     . zolpidem (AMBIEN) 5 MG tablet Take 1 tablet (5 mg total) by mouth at bedtime as needed. for sleep (Patient not taking: Reported on 10/20/2020) 30 tablet 0   No current facility-administered medications for this visit.   Facility-Administered Medications Ordered in Other Visits  Medication Dose Route Frequency Provider Last Rate Last Admin  . heparin lock flush 100 unit/mL  500 Units Intravenous Once Lloyd Huger, MD      . heparin lock flush 100 unit/mL  500 Units Intravenous Once Lloyd Huger, MD      . heparin lock flush 100 unit/mL  500 Units Intracatheter Once PRN Lloyd Huger, MD      . sodium chloride 0.9 % injection 10 mL  10 mL Intracatheter PRN Lloyd Huger, MD   10 mL at 07/22/15 0956  . sodium chloride 0.9 % injection 10 mL  10 mL Intravenous PRN Lloyd Huger, MD   10 mL at 09/02/15 0941  . sodium chloride 0.9 % injection 10 mL  10 mL Intravenous PRN Lloyd Huger, MD      . sodium chloride 0.9 % injection 10 mL  10 mL Intracatheter PRN Lloyd Huger, MD   10 mL at 09/23/15 1205  . sodium chloride 0.9 % injection 10 mL  10 mL Intracatheter PRN Lloyd Huger, MD   10 mL at 09/30/15 0900    Review of Systems  Constitutional: Positive for weight loss (4 lbs). Negative for chills, diaphoresis, fever and malaise/fatigue.       Feels "good."  HENT: Negative for congestion, ear discharge, ear pain, hearing loss, nosebleeds, sinus pain, sore throat  and tinnitus.        Tooth problem, seeing dentist next week.  Eyes: Negative.  Negative for blurred vision, double vision and photophobia.  Respiratory: Negative.  Negative for cough, sputum production and shortness of breath.   Cardiovascular: Negative.  Negative for chest pain, palpitations, orthopnea, leg swelling and PND.  Gastrointestinal: Negative.  Negative for abdominal pain, blood in stool, constipation, diarrhea, heartburn, melena, nausea and vomiting.  Genitourinary: Negative.  Negative for dysuria, frequency, hematuria and urgency.  Musculoskeletal: Negative for back pain, joint pain, myalgias and neck pain.       Knots in breast due to scar tissue.  Skin: Negative.  Negative  for itching and rash.  Neurological: Positive for sensory change (legs numb from the knees down) and headaches (due to tooth problem). Negative for dizziness, tingling, focal weakness and weakness.  Endo/Heme/Allergies: Positive for environmental allergies (Claritin daily). Does not bruise/bleed easily.  Psychiatric/Behavioral: Negative for depression. The patient is nervous/anxious and has insomnia.        Tearful when discussing insomnia.  All other systems reviewed and are negative.  Performance status (ECOG): 1  Vitals Blood pressure 139/79, pulse 74, temperature (!) 97.5 F (36.4 C), temperature source Tympanic, height '5\' 9"'  (1.753 m), weight 225 lb 6.7 oz (102.3 kg), SpO2 96 %.  Physical Exam Vitals and nursing note reviewed.  Constitutional:      General: She is not in acute distress.    Appearance: She is well-developed. She is not diaphoretic.  HENT:     Head: Normocephalic and atraumatic.     Comments: Shoulderlength hair.    Mouth/Throat:     Mouth: Mucous membranes are moist.     Pharynx: Oropharynx is clear. No oropharyngeal exudate.  Eyes:     General: No scleral icterus.    Extraocular Movements: Extraocular movements intact.     Conjunctiva/sclera: Conjunctivae normal.      Pupils: Pupils are equal, round, and reactive to light.     Comments: Blue eyes.  Neck:     Vascular: No JVD.  Cardiovascular:     Rate and Rhythm: Normal rate and regular rhythm.     Heart sounds: Normal heart sounds. No murmur heard.  No friction rub. No gallop.   Pulmonary:     Effort: Pulmonary effort is normal. No respiratory distress.     Breath sounds: Normal breath sounds. No wheezing or rales.  Chest:     Chest wall: No tenderness.     Breasts:        Right: Skin change (small eschar on right breast) present. No inverted nipple, mass, nipple discharge or tenderness.        Left: Skin change (scarring at 12 o clock, 10 cm above nipple, stable. Vascular web in upper outer quadrant, stable.) present. No inverted nipple, mass, nipple discharge or tenderness.  Abdominal:     General: Bowel sounds are normal. There is no distension.     Palpations: Abdomen is soft. There is no mass.     Tenderness: There is no abdominal tenderness. There is no guarding or rebound.  Musculoskeletal:        General: No swelling or tenderness. Normal range of motion.     Cervical back: Normal range of motion and neck supple.  Lymphadenopathy:     Head:     Right side of head: No preauricular, posterior auricular or occipital adenopathy.     Left side of head: No preauricular, posterior auricular or occipital adenopathy.     Cervical: No cervical adenopathy.     Upper Body:     Right upper body: No supraclavicular or axillary adenopathy.     Left upper body: No supraclavicular or axillary adenopathy.     Lower Body: No right inguinal adenopathy. No left inguinal adenopathy.  Skin:    General: Skin is warm and dry.     Coloration: Skin is not pale.     Findings: No erythema or rash.  Neurological:     Mental Status: She is alert and oriented to person, place, and time.  Psychiatric:        Thought Content: Thought content normal.  Judgment: Judgment normal.     Comments: Tearful when  discussing insomnia.    Appointment on 10/20/2020  Component Date Value Ref Range Status  . Sodium 10/20/2020 139  135 - 145 mmol/L Final  . Potassium 10/20/2020 4.3  3.5 - 5.1 mmol/L Final  . Chloride 10/20/2020 102  98 - 111 mmol/L Final  . CO2 10/20/2020 28  22 - 32 mmol/L Final  . Glucose, Bld 10/20/2020 111* 70 - 99 mg/dL Final   Glucose reference range applies only to samples taken after fasting for at least 8 hours.  . BUN 10/20/2020 18  6 - 20 mg/dL Final  . Creatinine, Ser 10/20/2020 0.77  0.44 - 1.00 mg/dL Final  . Calcium 10/20/2020 9.2  8.9 - 10.3 mg/dL Final  . Total Protein 10/20/2020 7.0  6.5 - 8.1 g/dL Final  . Albumin 10/20/2020 3.9  3.5 - 5.0 g/dL Final  . AST 10/20/2020 24  15 - 41 U/L Final  . ALT 10/20/2020 26  0 - 44 U/L Final  . Alkaline Phosphatase 10/20/2020 76  38 - 126 U/L Final  . Total Bilirubin 10/20/2020 0.7  0.3 - 1.2 mg/dL Final  . GFR, Estimated 10/20/2020 >60  >60 mL/min Final   Comment: (NOTE) Calculated using the CKD-EPI Creatinine Equation (2021)   . Anion gap 10/20/2020 9  5 - 15 Final   Performed at [facility], [address], [place], [place] [address]  . WBC 10/20/2020 5.9  4.0 - 10.5 K/uL Final  . RBC 10/20/2020 4.08  3.87 - 5.11 MIL/uL Final  . Hemoglobin 10/20/2020 13.0  12.0 - 15.0 g/dL Final  . HCT 10/20/2020 38.1  36 - 46 % Final  . MCV 10/20/2020 93.4  80.0 - 100.0 fL Final  . MCH 10/20/2020 31.9  26.0 - 34.0 pg Final  . MCHC 10/20/2020 34.1  30.0 - 36.0 g/dL Final  . RDW 10/20/2020 12.4  11.5 - 15.5 % Final  . Platelets 10/20/2020 280  150 - 400 K/uL Final  . nRBC 10/20/2020 0.0  0.0 - 0.2 % Final  . Neutrophils Relative % 10/20/2020 53  % Final  . Neutro Abs 10/20/2020 3.1  1.7 - 7.7 K/uL Final  . Lymphocytes Relative 10/20/2020 31  % Final  . Lymphs Abs 10/20/2020 1.8  0.7 - 4.0 K/uL Final  . Monocytes Relative 10/20/2020 9  % Final  . Monocytes Absolute 10/20/2020 0.5  0.1 - 1.0 K/uL Final  . Eosinophils  Relative 10/20/2020 5  % Final  . Eosinophils Absolute 10/20/2020 0.3  0.0 - 0.5 K/uL Final  . Basophils Relative 10/20/2020 2  % Final  . Basophils Absolute 10/20/2020 0.1  0.0 - 0.1 K/uL Final  . Immature Granulocytes 10/20/2020 0  % Final  . Abs Immature Granulocytes 10/20/2020 0.00  0.00 - 0.07 K/uL Final   Performed at [facility], [address] [place], [place] [address]    Assessment:  Brittany Ryan is a 59 y.o. 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  Pathology revealed a 1.5 cm grade III invasive mammary carcinoma with medullary features.  There was no DCIS.  Margins were close (4 mm).  One sentinel lymph node was negative.  Tumor was ER negative, PR negative, HER2 negative by IHC.   Pathologic stage was T1cN0.  [facility] genetic testing was negative.  She received  4 cycles of Adriamycin and Cytoxan from 06/10/2015 - 08/05/2015.  She received weekly Taxol x 12 from 08/19/2015 - 11/07/2015.   She underwent 5000 cGy to her left breast with a scar boost of 1400 cGy from 12/15/2015 - 01/20/2016.   Bilateral diagnostic mammogram on 03/21/2020 revealed a stable left breast lumpectomy site. There was no mammographic evidence of malignancy in the bilateral breasts.  CA27.29 has been followed:  12.7 on 02/10/2016, 10.4 on 05/11/2016, 17.9 on 08/24/2016, 15.5 on 11/30/2016, 20.6 on 02/22/2017, 18.8 on 08/23/2017, 19.5 on 04/20/2019, 16.5 on 04/20/2020, and 13.5 on 10/20/2020.  She has B12 deficiency.  B12 was 269 on 04/20/2019.  She is on oral B12.  B12 was 451 on 05/19/2019 and 1029 on 04/20/2020.  She received her first COVID-19 vaccine on 04/15/2020. Her second one is scheduled for 05/12/2020.   Symptomatically, she feels "good". Her legs feel numb from the knees down; she stopped taking gabapentin and is seeing neurology in 11/2020. She  denies bone or joint problems.  She has significant insomnia.   Plan: 1.   Labs today: CBC with diff, CMP, CA27.29. 2.   Stage IA triple negative left breast cancer             She is s/p lumpectomy and sentinel lymph node biopsy on 05/19/2015.  She received AC x 4 followed by weekly Taxol x 12 (last 11/07/2015).             Clinically, she continues to do well             Exam reveals no evidence of recurrent disease  Bilateral diagnostic mammogram on 03/21/2020 revealed no evidence of disease.  CA27-29 is 13.5 (normal) today.  Continue surveillance. 3.   Peripheral neuropathy             She has a grade II Taxol induced peripheral neuropathy.  She stopped taking her gabapentin.  She has a follow-up with neurology on 11/03/2020 4.   B12 deficiency             B12 was 269 on 04/20/2019 and 1029 on 04/20/2020.  She takes B12 every other day.  B12 goal 400.  Check folate annually. 5.   Insomnia  Refill Ambien 5 mg p.o. nightly as needed insomnia (dispense #30 with no refills). 6.   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 7.   Influenza vaccine. 8.   Bilateral screening mammogram on 03/21/2021. 9.   RTC in 6 months for MD assessment, labs (CBC with diff, CMP, B12, folate CA27.29), and review mammogram.  I discussed the assessment and treatment plan with the patient.  The patient was provided an opportunity to ask questions and all were answered.  The patient agreed with the plan and demonstrated an understanding of the instructions.  The patient was advised to call back if the symptoms worsen or if the condition fails to improve as anticipated.   Lequita Asal, MD, PhD    10/20/2020, 8:49 AM  I, Mirian Mo Tufford, am acting as a Education administrator for Lequita Asal, MD.  I, [place] Mike Gip, MD, have reviewed the above documentation for accuracy and completeness, and I agree with the above.

## 2020-10-20 ENCOUNTER — Other Ambulatory Visit: Payer: Self-pay

## 2020-10-20 ENCOUNTER — Encounter: Payer: Self-pay | Admitting: Hematology and Oncology

## 2020-10-20 ENCOUNTER — Ambulatory Visit: Payer: BLUE CROSS/BLUE SHIELD

## 2020-10-20 ENCOUNTER — Inpatient Hospital Stay: Payer: BLUE CROSS/BLUE SHIELD | Attending: Hematology and Oncology | Admitting: Hematology and Oncology

## 2020-10-20 ENCOUNTER — Inpatient Hospital Stay: Payer: BLUE CROSS/BLUE SHIELD

## 2020-10-20 VITALS — BP 139/79 | HR 74 | Temp 97.5°F | Ht 69.0 in | Wt 225.4 lb

## 2020-10-20 DIAGNOSIS — R519 Headache, unspecified: Secondary | ICD-10-CM | POA: Diagnosis not present

## 2020-10-20 DIAGNOSIS — Z171 Estrogen receptor negative status [ER-]: Secondary | ICD-10-CM | POA: Diagnosis not present

## 2020-10-20 DIAGNOSIS — G4700 Insomnia, unspecified: Secondary | ICD-10-CM | POA: Diagnosis not present

## 2020-10-20 DIAGNOSIS — E538 Deficiency of other specified B group vitamins: Secondary | ICD-10-CM | POA: Insufficient documentation

## 2020-10-20 DIAGNOSIS — C50412 Malignant neoplasm of upper-outer quadrant of left female breast: Secondary | ICD-10-CM | POA: Insufficient documentation

## 2020-10-20 DIAGNOSIS — Z809 Family history of malignant neoplasm, unspecified: Secondary | ICD-10-CM | POA: Diagnosis not present

## 2020-10-20 DIAGNOSIS — Z923 Personal history of irradiation: Secondary | ICD-10-CM | POA: Insufficient documentation

## 2020-10-20 DIAGNOSIS — G629 Polyneuropathy, unspecified: Secondary | ICD-10-CM | POA: Diagnosis not present

## 2020-10-20 DIAGNOSIS — T451X5A Adverse effect of antineoplastic and immunosuppressive drugs, initial encounter: Secondary | ICD-10-CM

## 2020-10-20 DIAGNOSIS — Z79899 Other long term (current) drug therapy: Secondary | ICD-10-CM | POA: Insufficient documentation

## 2020-10-20 DIAGNOSIS — Z9221 Personal history of antineoplastic chemotherapy: Secondary | ICD-10-CM | POA: Diagnosis not present

## 2020-10-20 DIAGNOSIS — Z8249 Family history of ischemic heart disease and other diseases of the circulatory system: Secondary | ICD-10-CM | POA: Diagnosis not present

## 2020-10-20 DIAGNOSIS — G620 Drug-induced polyneuropathy: Secondary | ICD-10-CM

## 2020-10-20 DIAGNOSIS — Z23 Encounter for immunization: Secondary | ICD-10-CM

## 2020-10-20 DIAGNOSIS — Z803 Family history of malignant neoplasm of breast: Secondary | ICD-10-CM | POA: Diagnosis not present

## 2020-10-20 DIAGNOSIS — R42 Dizziness and giddiness: Secondary | ICD-10-CM | POA: Insufficient documentation

## 2020-10-20 DIAGNOSIS — R635 Abnormal weight gain: Secondary | ICD-10-CM

## 2020-10-20 LAB — COMPREHENSIVE METABOLIC PANEL
ALT: 26 U/L (ref 0–44)
AST: 24 U/L (ref 15–41)
Albumin: 3.9 g/dL (ref 3.5–5.0)
Alkaline Phosphatase: 76 U/L (ref 38–126)
Anion gap: 9 (ref 5–15)
BUN: 18 mg/dL (ref 6–20)
CO2: 28 mmol/L (ref 22–32)
Calcium: 9.2 mg/dL (ref 8.9–10.3)
Chloride: 102 mmol/L (ref 98–111)
Creatinine, Ser: 0.77 mg/dL (ref 0.44–1.00)
GFR, Estimated: >60 mL/min (ref >60)
Glucose, Bld: 111 mg/dL — ABNORMAL HIGH (ref 70–99)
Potassium: 4.3 mmol/L (ref 3.5–5.1)
Sodium: 139 mmol/L (ref 135–145)
Total Bilirubin: 0.7 mg/dL (ref 0.3–1.2)
Total Protein: 7 g/dL (ref 6.5–8.1)

## 2020-10-20 LAB — CBC WITH DIFFERENTIAL/PLATELET
Abs Immature Granulocytes: 0 10*3/uL (ref 0.00–0.07)
Basophils Absolute: 0.1 10*3/uL (ref 0.0–0.1)
Basophils Relative: 2 %
Eosinophils Absolute: 0.3 10*3/uL (ref 0.0–0.5)
Eosinophils Relative: 5 %
HCT: 38.1 % (ref 36.0–46.0)
Hemoglobin: 13 g/dL (ref 12.0–15.0)
Immature Granulocytes: 0 %
Lymphocytes Relative: 31 %
Lymphs Abs: 1.8 10*3/uL (ref 0.7–4.0)
MCH: 31.9 pg (ref 26.0–34.0)
MCHC: 34.1 g/dL (ref 30.0–36.0)
MCV: 93.4 fL (ref 80.0–100.0)
Monocytes Absolute: 0.5 10*3/uL (ref 0.1–1.0)
Monocytes Relative: 9 %
Neutro Abs: 3.1 10*3/uL (ref 1.7–7.7)
Neutrophils Relative %: 53 %
Platelets: 280 10*3/uL (ref 150–400)
RBC: 4.08 MIL/uL (ref 3.87–5.11)
RDW: 12.4 % (ref 11.5–15.5)
WBC: 5.9 10*3/uL (ref 4.0–10.5)
nRBC: 0 % (ref 0.0–0.2)

## 2020-10-20 MED ORDER — INFLUENZA VAC SPLIT QUAD 0.5 ML IM SUSY
0.5000 mL | PREFILLED_SYRINGE | Freq: Once | INTRAMUSCULAR | Status: AC
  Administered 2020-10-20: 0.5 mL via INTRAMUSCULAR

## 2020-10-20 MED ORDER — INFLUENZA VAC SPLIT QUAD 0.5 ML IM SUSY: 0.5000 mL | PREFILLED_SYRINGE | Freq: Once | INTRAMUSCULAR | Status: DC

## 2020-10-20 MED ORDER — INFLUENZA VAC SPLIT QUAD 0.5 ML IM SUSY
PREFILLED_SYRINGE | INTRAMUSCULAR | Status: AC
  Filled 2020-10-20: qty 0.5

## 2020-10-20 MED ORDER — INFLUENZA VAC A&B SA ADJ QUAD 0.5 ML IM PRSY: 0.5000 mL | PREFILLED_SYRINGE | Freq: Once | INTRAMUSCULAR | Status: DC

## 2020-10-20 MED ORDER — ZOLPIDEM TARTRATE 5 MG PO TABS: 5.0000 mg | ORAL_TABLET | Freq: Every evening | ORAL | 0 refills | Status: DC | PRN

## 2020-10-20 NOTE — Progress Notes (Signed)
Patient here for follow up today, states she would like to discuss getting refill of ambien due to being able to sleep. Patient also states her neuropathy feels like it has gotten worst. States she has follow up with neurologist in December. Would like to request having her blood sugar checked with labs that was previously drawn this morning. Patient would also like to know if she could get a flu shot today.

## 2020-10-21 LAB — CANCER ANTIGEN 27.29: CA 27.29: 13.5 U/mL (ref 0.0–38.6)

## 2020-11-06 DIAGNOSIS — G4700 Insomnia, unspecified: Secondary | ICD-10-CM | POA: Insufficient documentation

## 2021-04-14 ENCOUNTER — Other Ambulatory Visit: Payer: Self-pay

## 2021-04-14 DIAGNOSIS — Z23 Encounter for immunization: Secondary | ICD-10-CM

## 2021-04-14 DIAGNOSIS — C50412 Malignant neoplasm of upper-outer quadrant of left female breast: Secondary | ICD-10-CM

## 2021-04-19 ENCOUNTER — Inpatient Hospital Stay (HOSPITAL_BASED_OUTPATIENT_CLINIC_OR_DEPARTMENT_OTHER): Payer: BLUE CROSS/BLUE SHIELD | Admitting: Oncology

## 2021-04-19 ENCOUNTER — Other Ambulatory Visit: Payer: Self-pay

## 2021-04-19 ENCOUNTER — Inpatient Hospital Stay: Payer: BLUE CROSS/BLUE SHIELD | Attending: Oncology

## 2021-04-19 ENCOUNTER — Encounter: Payer: Self-pay | Admitting: Oncology

## 2021-04-19 VITALS — BP 156/69 | HR 72 | Temp 96.9°F | Resp 18 | Wt 229.7 lb

## 2021-04-19 DIAGNOSIS — Z9221 Personal history of antineoplastic chemotherapy: Secondary | ICD-10-CM | POA: Diagnosis not present

## 2021-04-19 DIAGNOSIS — Z8249 Family history of ischemic heart disease and other diseases of the circulatory system: Secondary | ICD-10-CM | POA: Insufficient documentation

## 2021-04-19 DIAGNOSIS — Z853 Personal history of malignant neoplasm of breast: Secondary | ICD-10-CM

## 2021-04-19 DIAGNOSIS — Z809 Family history of malignant neoplasm, unspecified: Secondary | ICD-10-CM | POA: Diagnosis not present

## 2021-04-19 DIAGNOSIS — Z79899 Other long term (current) drug therapy: Secondary | ICD-10-CM | POA: Diagnosis not present

## 2021-04-19 DIAGNOSIS — Z803 Family history of malignant neoplasm of breast: Secondary | ICD-10-CM | POA: Diagnosis not present

## 2021-04-19 DIAGNOSIS — Z08 Encounter for follow-up examination after completed treatment for malignant neoplasm: Secondary | ICD-10-CM

## 2021-04-19 DIAGNOSIS — C50412 Malignant neoplasm of upper-outer quadrant of left female breast: Secondary | ICD-10-CM | POA: Diagnosis present

## 2021-04-19 DIAGNOSIS — Z923 Personal history of irradiation: Secondary | ICD-10-CM | POA: Insufficient documentation

## 2021-04-19 DIAGNOSIS — Z171 Estrogen receptor negative status [ER-]: Secondary | ICD-10-CM | POA: Diagnosis not present

## 2021-04-19 LAB — COMPREHENSIVE METABOLIC PANEL
ALT: 25 U/L (ref 0–44)
AST: 26 U/L (ref 15–41)
Albumin: 4 g/dL (ref 3.5–5.0)
Alkaline Phosphatase: 72 U/L (ref 38–126)
Anion gap: 9 (ref 5–15)
BUN: 23 mg/dL — ABNORMAL HIGH (ref 6–20)
CO2: 27 mmol/L (ref 22–32)
Calcium: 9.2 mg/dL (ref 8.9–10.3)
Chloride: 101 mmol/L (ref 98–111)
Creatinine, Ser: 0.85 mg/dL (ref 0.44–1.00)
GFR, Estimated: >60 mL/min (ref >60)
Glucose, Bld: 107 mg/dL — ABNORMAL HIGH (ref 70–99)
Potassium: 4.1 mmol/L (ref 3.5–5.1)
Sodium: 137 mmol/L (ref 135–145)
Total Bilirubin: 0.7 mg/dL (ref 0.3–1.2)
Total Protein: 7.5 g/dL (ref 6.5–8.1)

## 2021-04-19 LAB — CBC WITH DIFFERENTIAL/PLATELET
Abs Immature Granulocytes: 0.01 10*3/uL (ref 0.00–0.07)
Basophils Absolute: 0.1 10*3/uL (ref 0.0–0.1)
Basophils Relative: 2 %
Eosinophils Absolute: 0.3 10*3/uL (ref 0.0–0.5)
Eosinophils Relative: 6 %
HCT: 39.2 % (ref 36.0–46.0)
Hemoglobin: 12.9 g/dL (ref 12.0–15.0)
Immature Granulocytes: 0 %
Lymphocytes Relative: 26 %
Lymphs Abs: 1.4 10*3/uL (ref 0.7–4.0)
MCH: 31.2 pg (ref 26.0–34.0)
MCHC: 32.9 g/dL (ref 30.0–36.0)
MCV: 94.7 fL (ref 80.0–100.0)
Monocytes Absolute: 0.5 10*3/uL (ref 0.1–1.0)
Monocytes Relative: 10 %
Neutro Abs: 3 10*3/uL (ref 1.7–7.7)
Neutrophils Relative %: 56 %
Platelets: 279 10*3/uL (ref 150–400)
RBC: 4.14 MIL/uL (ref 3.87–5.11)
RDW: 12.6 % (ref 11.5–15.5)
WBC: 5.3 10*3/uL (ref 4.0–10.5)
nRBC: 0 % (ref 0.0–0.2)

## 2021-04-19 LAB — VITAMIN B12: Vitamin B-12: 494 pg/mL (ref 180–914)

## 2021-04-19 LAB — FOLATE: Folate: 15 ng/mL (ref >5.9)

## 2021-04-19 NOTE — Progress Notes (Signed)
Pt states she needs the earliest appt possible for a mammo.

## 2021-04-19 NOTE — Progress Notes (Signed)
Hematology/Oncology Consult note [facility]  [phone] [phone]  Patient Care Team: [name], NP as PCP - General (Nurse Practitioner) [name], NP (Nurse Practitioner) Florene Glen, MD (Surgery) Lequita Asal, MD as Referring Physician (Hematology and Oncology)   Name of the patient: Brittany Ryan  [id number]  03-Oct-1961   Date of visit: 04/19/21  Diagnosis-history of stage I triple negative left breast cancer in 2016  Chief complaint/ Reason for visit-surveillance visit for breast cancer  Heme/Onc history: Brittany Ryan is a 60 y.o. female withstageIAtriple negative invasive mammary carcinomaof the upper outer quadrant of the left breast s/pleft lumpectomy andsentinel lymph node biopsy on06/16/2016.Pathologyrevealed a 1.5 cm grade IIIinvasive mammary carcinomawith medullary features. There was no DCIS.Margins wereclose (4 mm).One sentinel lymph node was negative.Tumor wasER negative, PR negative, HER2 negative by IHC.Pathologic stagewas T1cN0. [facility] genetic testing was negative.  Shereceived4 cycles ofAdriamycin and Cytoxanfrom 06/10/2015 - 08/05/2015. She receivedweeklyTaxolx 34fom 08/19/2015 - 11/07/2015.  Patient also completed adjuvant radiation treatment  Interval history-currently patient feels well and denies any breast concerns.  Appetite and weight have remained stable.  Denies any new aches and pains anywhere.  She continues to do a full-time job  ECOG PS- 0 Pain scale- 0   Review of systems- Review of Systems  Constitutional: Negative for chills, fever, malaise/fatigue and weight loss.  HENT: Negative for congestion, ear discharge and nosebleeds.   Eyes: Negative for blurred vision.  Respiratory: Negative for cough, hemoptysis, sputum production, shortness of breath and wheezing.   Cardiovascular: Negative for chest pain, palpitations, orthopnea and claudication.   Gastrointestinal: Negative for abdominal pain, blood in stool, constipation, diarrhea, heartburn, melena, nausea and vomiting.  Genitourinary: Negative for dysuria, flank pain, frequency, hematuria and urgency.  Musculoskeletal: Negative for back pain, joint pain and myalgias.  Skin: Negative for rash.  Neurological: Negative for dizziness, tingling, focal weakness, seizures, weakness and headaches.  Endo/Heme/Allergies: Does not bruise/bleed easily.  Psychiatric/Behavioral: Negative for depression and suicidal ideas. The patient does not have insomnia.       No Known Allergies   Past Medical History:  Diagnosis Date  . Arthritis   . Breast cancer ([facility] 2016   stage IA triple negative invasive mammary carcinoma of the upper outer quadrant of the left breast s/p left lumpectomy and sentinel lymph node biopsy on 05/19/2015.  Pathology revealed a 1.5 cm grade III invasive mammary carcinoma with medullary features.  There was no DCIS.  Margins were close (4 mm).  One sentinel lymph node was negative.  Tumor was ER negative, PR negative, HER2 negative by IHC  . Frequent sinus infections   . History of chemotherapy 2016   BREAST CA  . Hypertension   . Neuropathy   . Personal history of chemotherapy 2016   left breast ca  . Personal history of radiation therapy 2016   left breast ca  . Radiation 2016-17   BREAST CA  . Seasonal allergies      Past Surgical History:  Procedure Laterality Date  . [facility]  .Marland KitchenBREAST LUMPECTOMY WITH NEEDLE LOCALIZATION Left 05/19/2015   Procedure: BREAST LUMPECTOMY WITH NEEDLE LOCALIZATION;  Surgeon: RFlorene Glen MD;  Location: [facility] OR[facility];  Service: General;  Laterality: Left;  . BREAST LUMPECTOMY WITH SENTINEL LYMPH NODE BIOPSY Left 05/19/2015   Procedure: BREAST LUMPECTOMY WITH SENTINEL LYMPH NODE BX;  Surgeon: RFlorene Glen MD;  Location: [facility];  Service: General;  Laterality: Left;  Marland Kitchen  MASTECTOMY, PARTIAL Left 05/19/2015   Procedure: MASTECTOMY PARTIAL;  Surgeon: Florene Glen, MD;  Location: [facility] ORS;  Service: General;  Laterality: Left;  . PORT-A-CATH REMOVAL Left 04/16/2017   Procedure: REMOVAL PORT-A-CATH;  Surgeon: Florene Glen, MD;  Location: [facility] OR[facility];  Service: General;  Laterality: Left;  . PORTACATH PLACEMENT Left 05/19/2015   Procedure: INSERTION PORT-A-CATH;  Surgeon: Florene Glen, MD;  Location: [facility] ORS;  Service: General;  Laterality: Left;    Social History   Socioeconomic History  . Marital status: Married    Spouse name: Not on file  . Number of children: Not on file  . Years of education: Not on file  . Highest education level: Not on file  Occupational History  . Not on file  Tobacco Use  . Smoking status: Never Smoker  . Smokeless tobacco: Never Used  Vaping Use  . Vaping Use: Never used  Substance and Sexual Activity  . Alcohol use: Yes    Comment: rare beer  . Drug use: No  . Sexual activity: Not on file  Other Topics Concern  . Not on file  Social History Narrative  . Not on file   Social Determinants of Health   Financial Resource Strain: Not on file  Food Insecurity: Not on file  Transportation Needs: Not on file  Physical Activity: Not on file  Stress: Not on file  Social Connections: Not on file  Intimate Partner Violence: Not on file    Family History  Problem Relation Age of Onset  . Cancer Father   . Hypertension Father   . CAD Father   . Heart attack Father   . Hypertension Mother   . Breast cancer Sister 30  . Breast cancer Paternal Aunt      Current Outpatient Medications:  .  Cyanocobalamin (VITAMIN B-12 PO), Take 2,500 mcg by mouth daily. , Disp: , Rfl:  .  gabapentin (NEURONTIN) 300 MG capsule, TAKE 1 CAPSULE(300 MG) BY MOUTH AT BEDTIME, Disp: 30 capsule, Rfl: 1 .  loratadine (CLARITIN) 10 MG tablet, Take 10 mg by mouth daily., Disp: , Rfl:  .  VITAMIN D, CHOLECALCIFEROL, PO, Take 400 Units by  mouth. , Disp: , Rfl:  .  MELATONIN PO, Take by mouth. (Patient not taking: Reported on 04/19/2021), Disp: , Rfl:  .  zolpidem (AMBIEN) 5 MG tablet, Take 1 tablet (5 mg total) by mouth at bedtime as needed. for sleep (Patient not taking: Reported on 04/19/2021), Disp: 30 tablet, Rfl: 0 No current facility-administered medications for this visit.  Facility-Administered Medications Ordered in Other Visits:  .  heparin lock flush 100 unit/mL, 500 Units, Intravenous, Once, Finnegan, Kathlene November, MD .  heparin lock flush 100 unit/mL, 500 Units, Intravenous, Once, Finnegan, Kathlene November, MD .  heparin lock flush 100 unit/mL, 500 Units, Intracatheter, Once PRN, Grayland Ormond, Kathlene November, MD .  sodium chloride 0.9 % injection 10 mL, 10 mL, Intracatheter, PRN, Lloyd Huger, MD, 10 mL at 07/22/15 0956 .  sodium chloride 0.9 % injection 10 mL, 10 mL, Intravenous, PRN, Lloyd Huger, MD, 10 mL at 09/02/15 0941 .  sodium chloride 0.9 % injection 10 mL, 10 mL, Intravenous, PRN, Grayland Ormond, Kathlene November, MD .  sodium chloride 0.9 % injection 10 mL, 10 mL, Intracatheter, PRN, Lloyd Huger, MD, 10 mL at 09/23/15 1205 .  sodium chloride 0.9 % injection 10 mL, 10 mL, Intracatheter, PRN, Grayland Ormond, Kathlene November, MD, 10 mL at  09/30/15 0900  Physical exam:  Vitals:   04/19/21 0940  BP: (!) 156/69  Pulse: 72  Resp: 18  Temp: (!) 96.9 F (36.1 C)  SpO2: 95%  Weight: 229 lb 11.5 oz (104.2 kg)   Physical Exam Constitutional:      General: She is not in acute distress. Cardiovascular:     Rate and Rhythm: Normal rate and regular rhythm.     Heart sounds: Normal heart sounds.  Pulmonary:     Effort: Pulmonary effort is normal.     Breath sounds: Normal breath sounds.  Skin:    General: Skin is warm and dry.  Neurological:     Mental Status: She is alert and oriented to person, place, and time.    Breast exam was performed in seated and lying down position. Patient is status post left lumpectomy with a  well-healed surgical scar. No evidence of any palpable masses. No evidence of axillary adenopathy. No evidence of any palpable masses or lumps in the right breast. No evidence of right axillary adenopathy.  Vascular work-up in the upper outer quadrant of the breast overall stable   CMP Latest Ref Rng & Units 04/19/2021  Glucose 70 - 99 mg/dL 107(H)  BUN 6 - 20 mg/dL 23(H)  Creatinine 0.44 - 1.00 mg/dL 0.85  Sodium 135 - 145 mmol/L 137  Potassium 3.5 - 5.1 mmol/L 4.1  Chloride 98 - 111 mmol/L 101  CO2 22 - 32 mmol/L 27  Calcium 8.9 - 10.3 mg/dL 9.2  Total Protein 6.5 - 8.1 g/dL 7.5  Total Bilirubin 0.3 - 1.2 mg/dL 0.7  Alkaline Phos 38 - 126 U/L 72  AST 15 - 41 U/L 26  ALT 0 - 44 U/L 25   CBC Latest Ref Rng & Units 04/19/2021  WBC 4.0 - 10.5 K/uL 5.3  Hemoglobin 12.0 - 15.0 g/dL 12.9  Hematocrit 36.0 - 46.0 % 39.2  Platelets 150 - 400 K/uL 279      Assessment and plan- Patient is a 60 y.o. female with history of stage I triple negative left breast cancer in 2016 s/p surgery adjuvant chemotherapy and radiation.  This is a routine surveillance visit  Has been more than 5 years since her breast cancer diagnosis and clinically patient is doing well with no concerning signs and symptoms of recurrence based on today's exam.  She would be due for mammogram at this time which we will schedule.  No role for routine tumor marker testing in stage I breast cancer.  I will see her back in 6 months for routine breast exam and following that I will see her on a yearly basis   Visit Diagnosis 1. Encounter for follow-up surveillance of breast cancer      Dr. Randa Evens, MD, MPH [facility] [phone] 04/19/2021 4:24 PM

## 2021-04-20 LAB — CANCER ANTIGEN 27.29: CA 27.29: 16.1 U/mL (ref 0.0–38.6)

## 2021-04-26 ENCOUNTER — Other Ambulatory Visit: Payer: Self-pay

## 2021-04-26 ENCOUNTER — Ambulatory Visit
Admission: RE | Admit: 2021-04-26 | Discharge: 2021-04-26 | Disposition: A | Discharge status: Home / Self Care | Payer: BLUE CROSS/BLUE SHIELD | Source: Ambulatory Visit | Attending: Oncology | Admitting: Oncology

## 2021-04-26 DIAGNOSIS — Z1231 Encounter for screening mammogram for malignant neoplasm of breast: Secondary | ICD-10-CM | POA: Diagnosis not present

## 2021-04-26 DIAGNOSIS — Z853 Personal history of malignant neoplasm of breast: Secondary | ICD-10-CM | POA: Diagnosis present

## 2021-04-26 DIAGNOSIS — Z08 Encounter for follow-up examination after completed treatment for malignant neoplasm: Secondary | ICD-10-CM

## 2021-10-18 ENCOUNTER — Ambulatory Visit: Payer: BLUE CROSS/BLUE SHIELD | Admitting: Oncology

## 2021-10-18 ENCOUNTER — Other Ambulatory Visit: Payer: BLUE CROSS/BLUE SHIELD

## 2021-10-20 ENCOUNTER — Telehealth: Payer: Self-pay | Admitting: Oncology

## 2021-10-20 NOTE — Telephone Encounter (Signed)
Pt called to cancel appt for today.Please call back at [phone].

## 2021-10-20 NOTE — Telephone Encounter (Signed)
There is a note in the appts from [facility] That pt called to cancel future appt and she will call back and reschedule her appt.

## 2021-10-25 ENCOUNTER — Inpatient Hospital Stay: Payer: BLUE CROSS/BLUE SHIELD

## 2021-10-25 ENCOUNTER — Inpatient Hospital Stay: Payer: BLUE CROSS/BLUE SHIELD | Admitting: Oncology

## 2023-08-02 ENCOUNTER — Ambulatory Visit
Admission: EM | Admit: 2023-08-02 | Discharge: 2023-08-02 | Disposition: A | Discharge status: Home / Self Care | Payer: BLUE CROSS/BLUE SHIELD | Source: Home / Self Care

## 2023-08-02 ENCOUNTER — Encounter: Payer: Self-pay | Admitting: Emergency Medicine

## 2023-08-02 DIAGNOSIS — U071 COVID-19: Secondary | ICD-10-CM | POA: Diagnosis present

## 2023-08-02 LAB — GROUP A STREP BY PCR: Group A Strep by PCR: NOT DETECTED

## 2023-08-02 LAB — SARS CORONAVIRUS 2 BY RT PCR: SARS Coronavirus 2 by RT PCR: POSITIVE — AB

## 2023-08-02 MED ORDER — HYDROCOD POLI-CHLORPHE POLI ER 10-8 MG/5ML PO SUER: 5.0000 mL | Freq: Every evening | ORAL | 0 refills | Status: AC | PRN

## 2023-08-02 NOTE — ED Provider Notes (Signed)
[facility]    CSN: [id number] Arrival date & time: 08/02/23  4098      History   Chief Complaint Chief Complaint  Patient presents with   Cough   Sore Throat    HPI Brittany Ryan is a 62 y.o. female who presents due to having cough, fever up to 103, body aches, fatigue, poor appetite, ST and HA x 3 days. She was sweating thought the night last night and had to change twice and took 2 showers. Has been taking Tylenol and alternating it with Ibuprofen. She has never had Covid. Has had 2 covid shots. She has been taking Mucinex which helps some during the day, but not at night time.     Past Medical History:  Diagnosis Date   Arthritis    Breast cancer (HCC) 2016   stage IA triple negative invasive mammary carcinoma of the upper outer quadrant of the left breast s/p left lumpectomy and sentinel lymph node biopsy on 05/19/2015.  Pathology revealed a 1.5 cm grade III invasive mammary carcinoma with medullary features.  There was no DCIS.  Margins were close (4 mm).  One sentinel lymph node was negative.  Tumor was ER negative, PR negative, HER2 negative by IHC   Frequent sinus infections    History of chemotherapy 2016   BREAST CA   Hypertension    Neuropathy    Personal history of chemotherapy 2016   left breast ca   Personal history of radiation therapy 2016   left breast ca   Radiation 2016-17   BREAST CA   Seasonal allergies     Patient Active Problem List   Diagnosis Date Noted   Insomnia 11/06/2020   Neuropathy 04/20/2020   Breast cancer (HCC) 04/19/2020   B12 deficiency 10/21/2019   Seasonal allergies    Hx of radiation therapy    Hypertension    Frequent sinus infections    Primary cancer of upper outer quadrant of left female breast (HCC)    History of chemotherapy 12/03/2014    Past Surgical History:  Procedure Laterality Date   BREAST BIOPSY Left 04/27/2015   [facility] with medullary features.   BREAST LUMPECTOMY WITH NEEDLE LOCALIZATION Left  05/19/2015   Procedure: BREAST LUMPECTOMY WITH NEEDLE LOCALIZATION;  Surgeon: Lattie Haw, MD;  Location: [facility] OR[facility];  Service: General;  Laterality: Left;   BREAST LUMPECTOMY WITH SENTINEL LYMPH NODE BIOPSY Left 05/19/2015   Procedure: BREAST LUMPECTOMY WITH SENTINEL LYMPH NODE BX;  Surgeon: Lattie Haw, MD;  Location: [facility] OR[facility];  Service: General;  Laterality: Left;   MASTECTOMY, PARTIAL Left 05/19/2015   Procedure: MASTECTOMY PARTIAL;  Surgeon: Lattie Haw, MD;  Location: [facility] OR[facility];  Service: General;  Laterality: Left;   PORT-A-CATH REMOVAL Left 04/16/2017   Procedure: REMOVAL PORT-A-CATH;  Surgeon: Lattie Haw, MD;  Location: [facility];  Service: General;  Laterality: Left;   PORTACATH PLACEMENT Left 05/19/2015   Procedure: INSERTION PORT-A-CATH;  Surgeon: Lattie Haw, MD;  Location: [facility] OR[facility];  Service: General;  Laterality: Left;    OB History   No obstetric history on file.      Home Medications    Prior to Admission medications   Medication Sig Start Date End Date Taking? Authorizing Provider  chlorpheniramine-HYDROcodone (TUSSIONEX) 10-8 MG/5ML Take 5 mLs by mouth at bedtime as needed for cough. 08/02/23  Yes Rodriguez-Southworth, Nettie Elm, PA-C  gabapentin (NEURONTIN) 300 MG capsule TAKE 1 CAPSULE(300 MG) BY MOUTH AT BEDTIME 06/07/20  Yes Merlene Pulling, [place]  C, MD  Cyanocobalamin (VITAMIN B-12 PO) Take 2,500 mcg by mouth daily.     [provider]  loratadine (CLARITIN) 10 MG tablet Take 10 mg by mouth daily.    [provider]  VITAMIN D, CHOLECALCIFEROL, PO Take 400 Units by mouth.     [provider]    Family History Family History  Problem Relation Age of Onset   Cancer Father    Hypertension Father    CAD Father    Heart attack Father    Hypertension Mother    Breast cancer Sister 26   Breast cancer Paternal Aunt     Social History Social History   Tobacco Use   Smoking status: Never   Smokeless tobacco: Never  Vaping  Use   Vaping status: Never Used  Substance Use Topics   Alcohol use: Yes    Comment: rare beer   Drug use: No     Allergies   Sulfa antibiotics   Review of Systems Review of Systems As noted in HPI  Physical Exam Triage Vital Signs ED Triage Vitals  Encounter Vitals Group     BP 08/02/23 0840 (!) 139/95     Systolic BP Percentile --      Diastolic BP Percentile --      Pulse Rate 08/02/23 0840 95     Resp 08/02/23 0840 16     Temp 08/02/23 0840 98.7 F (37.1 C)     Temp Source 08/02/23 0840 Oral     SpO2 08/02/23 0840 97 %     Weight 08/02/23 0840 229 lb 11.5 oz (104.2 kg)     Height 08/02/23 0840 5\' 9"  (1.753 m)     Head Circumference --      Peak Flow --      Pain Score 08/02/23 0841 9     Pain Loc --      Pain Education --      Exclude from Growth Chart --    No data found.  Updated Vital Signs BP (!) 139/95 (BP Location: Right Arm)   Pulse 95   Temp 98.7 F (37.1 C) (Oral)   Resp 16   Ht 5\' 9"  (1.753 m)   Wt 229 lb 11.5 oz (104.2 kg)   SpO2 97%   BMI 33.92 kg/m   Visual Acuity Right Eye Distance:   Left Eye Distance:   Bilateral Distance:    Right Eye Near:   Left Eye Near:    Bilateral Near:     Physical Exam  Physical Exam Vitals signs and nursing note reviewed.  Constitutional:      General: She is not in acute distress.    Appearance: Normal appearance. She is not ill-appearing, toxic-appearing or diaphoretic.  HENT:     Head: Normocephalic.     Right Ear: Tympanic membrane, ear canal and external ear normal.     Left Ear: Tympanic membrane, ear canal and external ear normal.     Nose: Nose normal.     Mouth/Throat: clear    Mouth: Mucous membranes are moist.  Eyes:     General: No scleral icterus.       Right eye: No discharge.        Left eye: No discharge.     Conjunctiva/sclera: Conjunctivae normal.  Neck:     Musculoskeletal: Neck supple. No neck rigidity.  Cardiovascular:     Rate and Rhythm: Normal rate and regular  rhythm.     Heart sounds: No  murmur.  Pulmonary:     Effort: Pulmonary effort is normal.     Breath sounds: Normal breath sounds.  Abdominal:     General: Bowel sounds are normal. There is no distension.     Palpations: Abdomen is soft. There is no mass.     Tenderness: There is no abdominal tenderness. There is no guarding or rebound.     Hernia: No hernia is present.  Musculoskeletal: Normal range of motion.  Lymphadenopathy:     Cervical: No cervical adenopathy.  Skin:    General: Skin is warm and dry.     Coloration: Skin is not jaundiced.     Findings: No rash.  Neurological:     Mental Status: She is alert and oriented to person, place, and time.     Gait: Gait normal.  Psychiatric:        Mood and Affect: Mood normal.        Behavior: Behavior normal.        Thought Content: Thought content normal.        Judgment: Judgment normal.   UC Treatments / Results  Labs (all labs ordered are listed, but only abnormal results are displayed) Labs Reviewed  SARS CORONAVIRUS 2 BY RT PCR - Abnormal; Notable for the following components:      Result Value   SARS Coronavirus 2 by RT PCR POSITIVE (*)    All other components within normal limits  GROUP A STREP BY PCR   PCR strep is negative EKG   Radiology No results found.  Procedures Procedures (including critical care time)  Medications Ordered in UC Medications - No data to display  Initial Impression / Assessment and Plan / UC Course  I have reviewed the triage vital signs and the nursing notes.  Pertinent labs  results that were available during my care of the patient were reviewed by me and considered in my medical decision making (see chart for details).  Covid infection  She was educated about the 2 option antiviral medications and need for renal function test if I placed her on Paxlovid, but she declined this medication. She just wants something for cough to help her sleep.  I prescribed her Tussionex as  noted. See instructions. Advised to stay quarantined for 5 days from onset of symptoms, then wear a mask for 5 days when in public.     Final Clinical Impressions(s) / UC Diagnoses   Final diagnoses:  COVID-19 virus infection     Discharge Instructions      I\  If you get short of breath and you are able to check  your oxygen with a pulse oxygen meter, if it gets to 92% or less, you need to go to the hospital to be admitted. If you dont have one, come back here and we will assess you.    Try to sit up when you are up and about and walk for at least 10 minutes a day so you dont get pneumonia      ED Prescriptions     Medication Sig Dispense Auth. Provider   chlorpheniramine-HYDROcodone (TUSSIONEX) 10-8 MG/5ML Take 5 mLs by mouth at bedtime as needed for cough. 115 mL Rodriguez-Southworth, Nettie Elm, PA-C      I have reviewed the PDMP during this encounter.   Garey Ham, [place] 08/02/23 [phone]

## 2023-08-02 NOTE — Discharge Instructions (Signed)
I\  If you get short of breath and you are able to check  your oxygen with a pulse oxygen meter, if it gets to 92% or less, you need to go to the hospital to be admitted. If you dont have one, come back here and we will assess you.    Try to sit up when you are up and about and walk for at least 10 minutes a day so you dont get pneumonia

## 2023-08-02 NOTE — ED Triage Notes (Signed)
Patient c/o cough, chest congestion, sore throat, headache and bodyaches that started on Tuesday.
